# Patient Record
Sex: Female | Born: 1950
Health system: Southern US, Community
[De-identification: ages and names within clinical notes are randomized; demographics above are authoritative.]

## PROBLEM LIST (undated history)

## (undated) DIAGNOSIS — H3530 Unspecified macular degeneration: Secondary | ICD-10-CM

## (undated) DIAGNOSIS — G245 Blepharospasm: Secondary | ICD-10-CM

## (undated) DIAGNOSIS — M545 Low back pain: Secondary | ICD-10-CM

## (undated) DIAGNOSIS — R209 Unspecified disturbances of skin sensation: Secondary | ICD-10-CM

## (undated) DIAGNOSIS — K122 Cellulitis and abscess of mouth: Secondary | ICD-10-CM

## (undated) DIAGNOSIS — M542 Cervicalgia: Secondary | ICD-10-CM

## (undated) DIAGNOSIS — M1990 Unspecified osteoarthritis, unspecified site: Secondary | ICD-10-CM

## (undated) DIAGNOSIS — B009 Herpesviral infection, unspecified: Secondary | ICD-10-CM

## (undated) DIAGNOSIS — E785 Hyperlipidemia, unspecified: Secondary | ICD-10-CM

## (undated) DIAGNOSIS — I10 Essential (primary) hypertension: Secondary | ICD-10-CM

## (undated) DIAGNOSIS — K219 Gastro-esophageal reflux disease without esophagitis: Secondary | ICD-10-CM

## (undated) DIAGNOSIS — M898X9 Other specified disorders of bone, unspecified site: Secondary | ICD-10-CM

## (undated) DIAGNOSIS — Z9189 Other specified personal risk factors, not elsewhere classified: Secondary | ICD-10-CM

## (undated) DIAGNOSIS — J019 Acute sinusitis, unspecified: Secondary | ICD-10-CM

## (undated) DIAGNOSIS — F411 Generalized anxiety disorder: Secondary | ICD-10-CM

## (undated) DIAGNOSIS — T7840XA Allergy, unspecified, initial encounter: Secondary | ICD-10-CM

## (undated) DIAGNOSIS — H6690 Otitis media, unspecified, unspecified ear: Secondary | ICD-10-CM

## (undated) DIAGNOSIS — F329 Major depressive disorder, single episode, unspecified: Secondary | ICD-10-CM

## (undated) HISTORY — DX: Major depressive disorder, single episode, unspecified: F32.9

## (undated) HISTORY — DX: Other specified disorders of bone, unspecified site: M89.8X9

## (undated) HISTORY — PX: GANGLION CYST EXCISION: SHX1691

## (undated) HISTORY — DX: Blepharospasm: G24.5

## (undated) HISTORY — DX: Generalized anxiety disorder: F41.1

## (undated) HISTORY — PX: ABDOMINAL HYSTERECTOMY: SHX81

## (undated) HISTORY — DX: Unspecified osteoarthritis, unspecified site: M19.90

## (undated) HISTORY — DX: Unspecified disturbances of skin sensation: R20.9

## (undated) HISTORY — DX: Acute sinusitis, unspecified: J01.90

## (undated) HISTORY — PX: OTHER SURGICAL HISTORY: SHX169

## (undated) HISTORY — DX: Cellulitis and abscess of mouth: K12.2

## (undated) HISTORY — PX: CHOLECYSTECTOMY: SHX55

## (undated) HISTORY — DX: Other specified personal risk factors, not elsewhere classified: Z91.89

## (undated) HISTORY — DX: Unspecified macular degeneration: H35.30

## (undated) HISTORY — DX: Cervicalgia: M54.2

## (undated) HISTORY — DX: Allergy, unspecified, initial encounter: T78.40XA

## (undated) HISTORY — DX: Hyperlipidemia, unspecified: E78.5

## (undated) HISTORY — PX: BREAST LUMPECTOMY: SHX2

## (undated) HISTORY — DX: Otitis media, unspecified, unspecified ear: H66.90

## (undated) HISTORY — DX: Low back pain: M54.5

## (undated) HISTORY — DX: Gastro-esophageal reflux disease without esophagitis: K21.9

## (undated) HISTORY — DX: Herpesviral infection, unspecified: B00.9

## (undated) HISTORY — DX: Essential (primary) hypertension: I10

---

## 1999-01-11 ENCOUNTER — Ambulatory Visit (HOSPITAL_BASED_OUTPATIENT_CLINIC_OR_DEPARTMENT_OTHER): Admission: RE | Admit: 1999-01-11 | Discharge: 1999-01-11 | Payer: Self-pay | Admitting: Surgery

## 2000-02-26 ENCOUNTER — Other Ambulatory Visit: Admission: RE | Admit: 2000-02-26 | Discharge: 2000-02-26 | Payer: Self-pay | Admitting: Gastroenterology

## 2000-02-26 ENCOUNTER — Encounter (INDEPENDENT_AMBULATORY_CARE_PROVIDER_SITE_OTHER): Payer: Self-pay | Admitting: Specialist

## 2000-09-26 ENCOUNTER — Encounter: Payer: Self-pay | Admitting: Internal Medicine

## 2000-09-26 LAB — CONVERTED CEMR LAB

## 2001-08-01 ENCOUNTER — Encounter: Payer: Self-pay | Admitting: Orthopedic Surgery

## 2001-08-01 ENCOUNTER — Ambulatory Visit (HOSPITAL_COMMUNITY): Admission: RE | Admit: 2001-08-01 | Discharge: 2001-08-01 | Payer: Self-pay | Admitting: Orthopedic Surgery

## 2004-06-10 ENCOUNTER — Other Ambulatory Visit: Admission: RE | Admit: 2004-06-10 | Discharge: 2004-06-10 | Payer: Self-pay | Admitting: Obstetrics and Gynecology

## 2004-12-12 ENCOUNTER — Emergency Department (HOSPITAL_COMMUNITY): Admission: EM | Admit: 2004-12-12 | Discharge: 2004-12-12 | Payer: Self-pay | Admitting: Emergency Medicine

## 2004-12-23 ENCOUNTER — Ambulatory Visit: Payer: Self-pay | Admitting: Gastroenterology

## 2004-12-24 ENCOUNTER — Encounter (INDEPENDENT_AMBULATORY_CARE_PROVIDER_SITE_OTHER): Payer: Self-pay | Admitting: *Deleted

## 2004-12-24 ENCOUNTER — Ambulatory Visit (HOSPITAL_COMMUNITY): Admission: RE | Admit: 2004-12-24 | Discharge: 2004-12-25 | Payer: Self-pay

## 2005-01-01 ENCOUNTER — Inpatient Hospital Stay (HOSPITAL_COMMUNITY): Admission: EM | Admit: 2005-01-01 | Discharge: 2005-01-02 | Payer: Self-pay | Admitting: Emergency Medicine

## 2005-01-07 ENCOUNTER — Ambulatory Visit: Payer: Self-pay | Admitting: Internal Medicine

## 2005-01-29 ENCOUNTER — Ambulatory Visit: Payer: Self-pay | Admitting: Internal Medicine

## 2005-02-03 ENCOUNTER — Ambulatory Visit: Payer: Self-pay | Admitting: Internal Medicine

## 2005-06-06 ENCOUNTER — Ambulatory Visit: Payer: Self-pay | Admitting: Internal Medicine

## 2006-01-16 ENCOUNTER — Ambulatory Visit: Payer: Self-pay | Admitting: Internal Medicine

## 2006-12-01 ENCOUNTER — Ambulatory Visit: Payer: Self-pay | Admitting: Internal Medicine

## 2006-12-01 DIAGNOSIS — H3530 Unspecified macular degeneration: Secondary | ICD-10-CM

## 2006-12-01 DIAGNOSIS — F329 Major depressive disorder, single episode, unspecified: Secondary | ICD-10-CM

## 2006-12-01 DIAGNOSIS — M898X9 Other specified disorders of bone, unspecified site: Secondary | ICD-10-CM

## 2006-12-01 DIAGNOSIS — M545 Low back pain, unspecified: Secondary | ICD-10-CM

## 2006-12-01 DIAGNOSIS — F411 Generalized anxiety disorder: Secondary | ICD-10-CM

## 2006-12-01 DIAGNOSIS — K219 Gastro-esophageal reflux disease without esophagitis: Secondary | ICD-10-CM | POA: Insufficient documentation

## 2006-12-01 DIAGNOSIS — G245 Blepharospasm: Secondary | ICD-10-CM

## 2006-12-01 DIAGNOSIS — Z9189 Other specified personal risk factors, not elsewhere classified: Secondary | ICD-10-CM

## 2006-12-01 DIAGNOSIS — I10 Essential (primary) hypertension: Secondary | ICD-10-CM

## 2006-12-01 DIAGNOSIS — F32A Depression, unspecified: Secondary | ICD-10-CM | POA: Insufficient documentation

## 2006-12-01 DIAGNOSIS — E785 Hyperlipidemia, unspecified: Secondary | ICD-10-CM | POA: Insufficient documentation

## 2006-12-01 DIAGNOSIS — F3289 Other specified depressive episodes: Secondary | ICD-10-CM

## 2006-12-01 HISTORY — DX: Unspecified macular degeneration: H35.30

## 2006-12-01 HISTORY — DX: Other specified disorders of bone, unspecified site: M89.8X9

## 2006-12-01 HISTORY — DX: Gastro-esophageal reflux disease without esophagitis: K21.9

## 2006-12-01 HISTORY — DX: Major depressive disorder, single episode, unspecified: F32.9

## 2006-12-01 HISTORY — DX: Generalized anxiety disorder: F41.1

## 2006-12-01 HISTORY — DX: Blepharospasm: G24.5

## 2006-12-01 HISTORY — DX: Hyperlipidemia, unspecified: E78.5

## 2006-12-01 HISTORY — DX: Other specified personal risk factors, not elsewhere classified: Z91.89

## 2006-12-01 HISTORY — DX: Low back pain, unspecified: M54.50

## 2006-12-01 HISTORY — DX: Other specified depressive episodes: F32.89

## 2006-12-01 HISTORY — DX: Essential (primary) hypertension: I10

## 2007-12-24 ENCOUNTER — Telehealth: Payer: Self-pay | Admitting: Internal Medicine

## 2008-05-01 ENCOUNTER — Ambulatory Visit: Payer: Self-pay | Admitting: Internal Medicine

## 2008-05-01 DIAGNOSIS — K122 Cellulitis and abscess of mouth: Secondary | ICD-10-CM

## 2008-05-01 HISTORY — DX: Cellulitis and abscess of mouth: K12.2

## 2008-05-22 ENCOUNTER — Telehealth (INDEPENDENT_AMBULATORY_CARE_PROVIDER_SITE_OTHER): Payer: Self-pay | Admitting: *Deleted

## 2008-05-30 ENCOUNTER — Ambulatory Visit: Payer: Self-pay | Admitting: Internal Medicine

## 2008-05-30 DIAGNOSIS — H6690 Otitis media, unspecified, unspecified ear: Secondary | ICD-10-CM | POA: Insufficient documentation

## 2008-05-30 DIAGNOSIS — B009 Herpesviral infection, unspecified: Secondary | ICD-10-CM | POA: Insufficient documentation

## 2008-05-30 HISTORY — DX: Herpesviral infection, unspecified: B00.9

## 2008-05-30 HISTORY — DX: Otitis media, unspecified, unspecified ear: H66.90

## 2008-08-01 ENCOUNTER — Telehealth (INDEPENDENT_AMBULATORY_CARE_PROVIDER_SITE_OTHER): Payer: Self-pay | Admitting: *Deleted

## 2008-08-24 ENCOUNTER — Ambulatory Visit: Payer: Self-pay | Admitting: Internal Medicine

## 2008-08-24 DIAGNOSIS — M542 Cervicalgia: Secondary | ICD-10-CM

## 2008-08-24 DIAGNOSIS — R209 Unspecified disturbances of skin sensation: Secondary | ICD-10-CM

## 2008-08-24 HISTORY — DX: Unspecified disturbances of skin sensation: R20.9

## 2008-08-24 HISTORY — DX: Cervicalgia: M54.2

## 2008-08-30 ENCOUNTER — Telehealth: Payer: Self-pay | Admitting: Internal Medicine

## 2008-12-25 ENCOUNTER — Encounter: Payer: Self-pay | Admitting: Internal Medicine

## 2009-01-11 ENCOUNTER — Telehealth: Payer: Self-pay | Admitting: Internal Medicine

## 2009-06-13 ENCOUNTER — Telehealth: Payer: Self-pay | Admitting: Internal Medicine

## 2009-08-23 ENCOUNTER — Ambulatory Visit: Payer: Self-pay | Admitting: Internal Medicine

## 2009-08-23 LAB — CONVERTED CEMR LAB
Basophils Relative: 0.4 % (ref 0.0–3.0)
CO2: 29 meq/L (ref 19–32)
Cholesterol: 341 mg/dL — ABNORMAL HIGH (ref 0–200)
Creatinine, Ser: 1 mg/dL (ref 0.4–1.2)
Eosinophils Absolute: 0.1 10*3/uL (ref 0.0–0.7)
Eosinophils Relative: 0.9 % (ref 0.0–5.0)
GFR calc non Af Amer: 60.75 mL/min (ref >60)
HCT: 35.3 % — ABNORMAL LOW (ref 36.0–46.0)
HDL: 45.6 mg/dL (ref >39.00)
Ketones, ur: NEGATIVE mg/dL
Leukocytes, UA: NEGATIVE
Lymphocytes Relative: 18.5 % (ref 12.0–46.0)
Lymphs Abs: 2.3 10*3/uL (ref 0.7–4.0)
MCHC: 34.1 g/dL (ref 30.0–36.0)
Monocytes Relative: 4.5 % (ref 3.0–12.0)
Neutro Abs: 9.4 10*3/uL — ABNORMAL HIGH (ref 1.4–7.7)
Neutrophils Relative %: 75.7 % (ref 43.0–77.0)
Platelets: 249 10*3/uL (ref 150.0–400.0)
Potassium: 4.8 meq/L (ref 3.5–5.1)
RBC: 3.89 M/uL (ref 3.87–5.11)
Specific Gravity, Urine: 1.01 (ref 1.000–1.030)
TSH: 3.81 microintl units/mL (ref 0.35–5.50)
Total Bilirubin: 0.5 mg/dL (ref 0.3–1.2)
Total CHOL/HDL Ratio: 7
Total Protein: 6.5 g/dL (ref 6.0–8.3)
Triglycerides: 321 mg/dL — ABNORMAL HIGH (ref 0.0–149.0)
Urine Glucose: NEGATIVE mg/dL
Urobilinogen, UA: 0.2 (ref 0.0–1.0)
WBC: 12.4 10*3/uL — ABNORMAL HIGH (ref 4.5–10.5)
pH: 5.5 (ref 5.0–8.0)

## 2009-08-27 ENCOUNTER — Telehealth: Payer: Self-pay | Admitting: Internal Medicine

## 2009-11-12 ENCOUNTER — Encounter: Payer: Self-pay | Admitting: Internal Medicine

## 2010-01-07 ENCOUNTER — Ambulatory Visit: Payer: Self-pay | Admitting: Internal Medicine

## 2010-01-07 DIAGNOSIS — J019 Acute sinusitis, unspecified: Secondary | ICD-10-CM

## 2010-01-07 HISTORY — DX: Acute sinusitis, unspecified: J01.90

## 2010-05-30 NOTE — Letter (Signed)
Summary: Referral - not able to see patient  [facility]  [address] [place], [place] [address]   Phone: [phone]  Fax: [phone]    November 12, 2009    Corwin Levins, M.D. [address] [place], [place] [address]   Re:   [name] DOB:  1951/01/26 MRN:   [id number]    Dear Dr. Jonny Ruiz:  Thank you for your kind referral of the above patient.  We have attempted to schedule the recommended procedure Screening Colonoscopy but have not been able to schedule because:   X  The patient was not available by phone and/or has not returned our calls.  ___ The patient declined to schedule the procedure at this time.  We appreciate the referral and hope that we will have the opportunity to treat this patient in the future.    Sincerely,    [facility] [phone]

## 2010-05-30 NOTE — Assessment & Plan Note (Signed)
Summary: ?EQUALIBRIUM OFF/CD   Vital Signs:  Patient profile:   60 year old female Height:      62.5 inches Weight:      141.13 pounds BMI:     25.49 O2 Sat:      97 % on Room air Temp:     98.4 degrees F oral Pulse rate:   62 / minute BP sitting:   120 / 62  (left arm) Cuff size:   regular  Vitals Entered By: Zella Ball Ewing CMA Duncan Dull) (January 07, 2010 3:35 PM)  O2 Flow:  Room air CC: Sinus congestion, head, ear and throat pain/RE   CC:  Sinus congestion, head, and ear and throat pain/RE.  History of Present Illness: here with 3 days worsening acute mild to mod facial pain, pressure, fever and greenish d/c, mild ST, no cough and Pt denies CP, worsening sob, doe, wheezing, orthopnea, pnd, worsening LE edema, palps, dizziness or syncope  Pt denies new neuro symptoms such as headache, facial or extremity weakness , except for left earache and pain to head above the ear as well.  No chills, polyuria, polydipsia. Overall good complaicne with meds, good tolerabiliyt  Problems Prior to Update: 1)  Sinusitis- Acute-nos  (ICD-461.9) 2)  Preventive Health Care  (ICD-V70.0) 3)  Paresthesia  (ICD-782.0) 4)  Cervicalgia  (ICD-723.1) 5)  Cold Sore  (ICD-054.9) 6)  Otitis Media, Acute, Left  (ICD-382.9) 7)  Cellulitis and Abscess of Oral Soft Tissues  (ICD-528.3) 8)  Hypertension, Borderline  (ICD-401.9) 9)  Macular Degeneration  (ICD-362.50) 10)  Blepharospasm  (ICD-333.81) 11)  Bone Spur  (ICD-726.91) 12)  Excision of Ganglion Cyst, Wrist, Hx of  (ICD-V15.89) 13)  Hyperlipidemia  (ICD-272.4) 14)  Anxiety  (ICD-300.00) 15)  Low Back Pain  (ICD-724.2) 16)  Gerd  (ICD-530.81) 17)  Depression  (ICD-311)  Medications Prior to Update: 1)  Alprazolam 1 Mg  Tabs (Alprazolam) .Marland Kitchen.. 1 By Mouth Two Times A Day As Needed 2)  Fluoxetine Hcl 40 Mg Caps (Fluoxetine Hcl) .... Take 1 By Mouth Once Daily 3)  Simvastatin 40 Mg Tabs (Simvastatin) .Marland Kitchen.. 1po Once Daily 4)  Valacyclovir Hcl 1 Gm Tabs  (Valacyclovir Hcl) .Marland Kitchen.. 1 By Mouth Two Times A Day For 5 Days 5)  Adult Aspirin Ec Low Strength 81 Mg Tbec (Aspirin) .Marland Kitchen.. 1 By Mouth Once Daily 6)  Tramadol Hcl 50 Mg Tabs (Tramadol Hcl) .Marland Kitchen.. 1po Q 6 Hrs As Needed Pain 7)  Omeprazole 20 Mg Cpdr (Omeprazole) .... 2 By Mouth Once Daily 8)  Cephalexin 500 Mg Caps (Cephalexin) .Marland Kitchen.. 1 Po  Three Times A Day  Current Medications (verified): 1)  Alprazolam 1 Mg  Tabs (Alprazolam) .Marland Kitchen.. 1 By Mouth Two Times A Day As Needed 2)  Fluoxetine Hcl 40 Mg Caps (Fluoxetine Hcl) .... Take 1 By Mouth Once Daily 3)  Simvastatin 40 Mg Tabs (Simvastatin) .Marland Kitchen.. 1po Once Daily 4)  Valacyclovir Hcl 1 Gm Tabs (Valacyclovir Hcl) .Marland Kitchen.. 1 By Mouth Two Times A Day For 5 Days 5)  Adult Aspirin Ec Low Strength 81 Mg Tbec (Aspirin) .Marland Kitchen.. 1 By Mouth Once Daily 6)  Tramadol Hcl 50 Mg Tabs (Tramadol Hcl) .Marland Kitchen.. 1po Q 6 Hrs As Needed Pain 7)  Omeprazole 20 Mg Cpdr (Omeprazole) .... 2 By Mouth Once Daily 8)  Doxycycline Hyclate 100 Mg Caps (Doxycycline Hyclate) .Marland Kitchen.. 1po Two Times A Day  Allergies (verified): 1)  ! Codeine 2)  ! Vicodin 3)  ! * Claritin D  Past History:  Past Medical History: Last updated: 12/01/2006 Depression GERD Low back pain Anxiety Hyperlipidemia  Past Surgical History: Last updated: 12/01/2006 Hysterectomy Lumpectomy- R Breast Removal of Ganglion Cyst Removal of bone spur, R foot Blepharospasm H/O Macular Degeneration Borderline HTN  Social History: Last updated: 08/23/2009 Married Current Smoker 1 child  work  - Forensic psychologist - collections Alcohol use-no Drug use-no  Risk Factors: Smoking Status: current (05/01/2008) Packs/Day: 1 PPD (12/01/2006)  Review of Systems       all otherwise negative per pt -    Physical Exam  General:  alert and well-developed.  , mild ill  Head:  normocephalic and atraumatic.   Eyes:  vision grossly intact, pupils equal, and pupils round.   Ears:  bilat tm's erythema, left more than right;   canals clear, sinus tender bialt maxillary Nose:  nasal dischargemucosal pallor and mucosal edema.   Mouth:  pharyngeal erythema and fair dentition.   Neck:  supple and cervical lymphadenopathy.   Lungs:  normal respiratory effort and normal breath sounds.   Heart:  normal rate and regular rhythm.   Extremities:  no edema, no erythema    Impression & Recommendations:  Problem # 1:  SINUSITIS- ACUTE-NOS (ICD-461.9)  Her updated medication list for this problem includes:    Doxycycline Hyclate 100 Mg Caps (Doxycycline hyclate) .Marland Kitchen... 1po two times a day treat as above, f/u any worsening signs or symptoms   Problem # 2:  HYPERLIPIDEMIA (ICD-272.4)  Her updated medication list for this problem includes:    Simvastatin 40 Mg Tabs (Simvastatin) .Marland Kitchen... 1po once daily decleins labs today,  Pt to continue diet efforts, good med tolerance;  - goal LDL less than 100  Problem # 3:  HYPERTENSION, BORDERLINE (ICD-401.9)  BP today: 120/62 Prior BP: 142/62 (08/23/2009)  Labs Reviewed: K+: 4.8 (08/23/2009) Creat: : 1.0 (08/23/2009)   Chol: 341 (08/23/2009)   HDL: 45.60 (08/23/2009)   TG: 321.0 (08/23/2009) improved, stable overall by hx and exam, ok to continue meds/tx as is   Complete Medication List: 1)  Alprazolam 1 Mg Tabs (Alprazolam) .Marland Kitchen.. 1 by mouth two times a day as needed 2)  Fluoxetine Hcl 40 Mg Caps (Fluoxetine hcl) .... Take 1 by mouth once daily 3)  Simvastatin 40 Mg Tabs (Simvastatin) .Marland Kitchen.. 1po once daily 4)  Valacyclovir Hcl 1 Gm Tabs (Valacyclovir hcl) .Marland Kitchen.. 1 by mouth two times a day for 5 days 5)  Adult Aspirin Ec Low Strength 81 Mg Tbec (Aspirin) .Marland Kitchen.. 1 by mouth once daily 6)  Tramadol Hcl 50 Mg Tabs (Tramadol hcl) .Marland Kitchen.. 1po q 6 hrs as needed pain 7)  Omeprazole 20 Mg Cpdr (Omeprazole) .... 2 by mouth once daily 8)  Doxycycline Hyclate 100 Mg Caps (Doxycycline hyclate) .Marland Kitchen.. 1po two times a day  Patient Instructions: 1)  Please take all new medications as prescribed 2)   Continue all previous medications as before this visit  3)  You can also use Mucinex OTC or it's generic for congestion  4)  Please schedule a follow-up appointment in April 2012 with cpx labs Prescriptions: DOXYCYCLINE HYCLATE 100 MG CAPS (DOXYCYCLINE HYCLATE) 1po two times a day  #20 x 0   Entered and Authorized by:   Corwin Levins MD   Signed by:   Corwin Levins MD on 01/07/2010   Method used:   Electronically to        [facility] (retail)       [address]  [place], [place]  [address]       Ph: [phone]       Fax: [phone]   RxID:   [id number]

## 2010-05-30 NOTE — Progress Notes (Signed)
Summary: URI?  Phone Note Call from Patient Call back at Work Phone [phone]   Caller: Patient Summary of Call: pt called stating that at OV 04/28 ahe was told by MD that she has a URI that was also in her lymph nodes and that was the cause of e fever blisters. Pt says that she did not get an Rx for an ABX and is requesting one now. please advise Initial call taken by: Margaret Pyle, CMA,  Aug 27, 2009 11:41 AM  Follow-up for Phone Call        ok to try cephalexin - done escript Follow-up by: Corwin Levins MD,  Aug 27, 2009 1:12 PM  Additional Follow-up for Phone Call Additional follow up Details #1::        pt informed Additional Follow-up by: Margaret Pyle, CMA,  Aug 27, 2009 1:23 PM    New/Updated Medications: CEPHALEXIN 500 MG CAPS (CEPHALEXIN) 1 po  three times a day Prescriptions: CEPHALEXIN 500 MG CAPS (CEPHALEXIN) 1 po  three times a day  #30 x 0   Entered and Authorized by:   Corwin Levins MD   Signed by:   Corwin Levins MD on 08/27/2009   Method used:   Electronically to        [facility] (retail)       [address]       [place], [place]  [address]       Ph: [phone]       Fax: [phone]   RxID:   [id number]

## 2010-05-30 NOTE — Assessment & Plan Note (Signed)
Summary: FU/MEDS---AND --FEVER BLISTERS  STC   Vital Signs:  Patient profile:   60 year old female Height:      62 inches Weight:      140.50 pounds BMI:     25.79 O2 Sat:      97 % on Room air Temp:     98 degrees F oral Pulse rate:   72 / minute BP sitting:   142 / 62  (left arm) Cuff size:   regular  Vitals Entered ByZella Ball Ewing (August 23, 2009 8:09 AM)  O2 Flow:  Room air  CC: Medication followup, fever blisters/RE   CC:  Medication followup and fever blisters/RE.  History of Present Illness: here for yearly f/u;  more stress lately, slept poorly last evening and today with fever blisters to the lips lower ;  in conjunction with recent URI and tender LA to left submandibular area last wk;  tuesday donated blood with BP there 100/70;  requests valtrex tx and pain med;  Pt denies CP, sob, doe, wheezing, orthopnea, pnd, worsening LE edema, palps, dizziness or syncope  Pt denies new neuro symptoms such as headache, facial or extremity weakness   Needs all other meds refilled as well.   Crestor too expensive.  Lower lip with fever blister quite painful  Preventive Screening-Counseling & Management      Drug Use:  no.    Problems Prior to Update: 1)  Preventive Health Care  (ICD-V70.0) 2)  Paresthesia  (ICD-782.0) 3)  Cervicalgia  (ICD-723.1) 4)  Cold Sore  (ICD-054.9) 5)  Otitis Media, Acute, Left  (ICD-382.9) 6)  Cellulitis and Abscess of Oral Soft Tissues  (ICD-528.3) 7)  Hypertension, Borderline  (ICD-401.9) 8)  Macular Degeneration  (ICD-362.50) 9)  Blepharospasm  (ICD-333.81) 10)  Bone Spur  (ICD-726.91) 11)  Excision of Ganglion Cyst, Wrist, Hx of  (ICD-V15.89) 12)  Hyperlipidemia  (ICD-272.4) 13)  Anxiety  (ICD-300.00) 14)  Low Back Pain  (ICD-724.2) 15)  Gerd  (ICD-530.81) 16)  Depression  (ICD-311)  Medications Prior to Update: 1)  Alprazolam 1 Mg  Tabs (Alprazolam) .Marland Kitchen.. 1 By Mouth Two Times A Day As Needed 2)  Fluoxetine Hcl 40 Mg Caps (Fluoxetine Hcl)  .... Take 1 By Mouth Once Daily 3)  Crestor 40 Mg Tabs (Rosuvastatin Calcium) .... 1/2  By Mouth Once Daily 4)  Valacyclovir Hcl 1 Gm Tabs (Valacyclovir Hcl) .Marland Kitchen.. 1 By Mouth Two Times A Day For 5 Days 5)  Adult Aspirin Ec Low Strength 81 Mg Tbec (Aspirin) .Marland Kitchen.. 1 By Mouth Once Daily 6)  Prednisone 10 Mg Tabs (Prednisone) .... 4po Qd For 3days, Then 3po Qd For 3days, Then 2po Qd For 3days, Then 1po Qd For 3 Days, Then Stop  Current Medications (verified): 1)  Alprazolam 1 Mg  Tabs (Alprazolam) .Marland Kitchen.. 1 By Mouth Two Times A Day As Needed 2)  Fluoxetine Hcl 40 Mg Caps (Fluoxetine Hcl) .... Take 1 By Mouth Once Daily 3)  Simvastatin 40 Mg Tabs (Simvastatin) .Marland Kitchen.. 1po Once Daily 4)  Valacyclovir Hcl 1 Gm Tabs (Valacyclovir Hcl) .Marland Kitchen.. 1 By Mouth Two Times A Day For 5 Days 5)  Adult Aspirin Ec Low Strength 81 Mg Tbec (Aspirin) .Marland Kitchen.. 1 By Mouth Once Daily 6)  Tramadol Hcl 50 Mg Tabs (Tramadol Hcl) .Marland Kitchen.. 1po Q 6 Hrs As Needed Pain 7)  Omeprazole 20 Mg Cpdr (Omeprazole) .... 2 By Mouth Once Daily  Allergies (verified): 1)  ! Codeine 2)  ! Vicodin 3)  ! 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 father with heart disease mother with HTN daughter with TYpe I DM   Social History: Reviewed history from 05/01/2008  and no changes required. 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         all otherwise negative per pt -  except for reflux symtpoms worsening in the past few months without dysphagia or n/v  Physical Exam  General:  alert and well-developed.   Head:  normocephalic and atraumatic.   Eyes:  vision grossly intact, pupils equal, and pupils round.   Ears:  R ear normal and L ear normal.   Nose:  no external deformity and no nasal discharge.   Mouth:  no gingival abnormalities and pharynx pink and moist.   Neck:  supple and no masses.   Lungs:  normal respiratory effort and normal breath sounds.   Heart:  normal rate and regular rhythm.   Abdomen:  soft and normal bowel sounds.   but mild epigastric tender without guarding or rebound Msk:  no joint tenderness and no joint swelling.   Extremities:  no edema, no erythema  Neurologic:  cranial nerves II-XII intact and strength normal in all extremities.   Skin:  color normal and no rashes.  , has several fever blister to lower lip area Psych:  not depressed appearing and moderately anxious.     Impression & Recommendations:  Problem # 1:  Preventive Health Care (ICD-V70.0)  Overall doing well, age appropriate education and counseling updated and referral for appropriate preventive services done unless declined, immunizations up to date or declined, diet counseling done if overweight, urged to quit smoking if smokes , most recent  labs reviewed and current ordered if appropriate, ecg reviewed or declined (interpretation per ECG scanned in the EMR if done); information regarding Medicare Prevention requirements given if appropriate   Orders: Gastroenterology Referral (GI) TLB-BMP (Basic Metabolic Panel-BMET) (80048-METABOL) TLB-CBC Platelet - w/Differential (85025-CBCD) TLB-Hepatic/Liver Function Pnl (80076-HEPATIC) TLB-Lipid Panel (80061-LIPID) TLB-TSH (Thyroid Stimulating Hormone) (84443-TSH) TLB-Udip ONLY (81003-UDIP)  Problem # 2:  COLD SORE (ICD-054.9)  for valtrex and tylenol #3 as needed   Problem # 3:  HYPERTENSION, BORDERLINE (ICD-401.9)  stable overall by hx and exam, ok to continue meds/tx as is , declines further meds  BP today: 142/62 Prior BP: 110/70 (08/24/2008)  Problem # 4:  ANXIETY (ICD-300.00)  Her updated medication list for this problem includes:    Alprazolam 1 Mg Tabs (Alprazolam) .Marland Kitchen... 1 by mouth two times a day as  needed    Fluoxetine Hcl 40 Mg Caps (Fluoxetine hcl) .Marland Kitchen... Take 1 by mouth once daily stable overall by hx and exam, ok to continue meds/tx as is   Problem # 5:  HYPERLIPIDEMIA (ICD-272.4)  Her updated medication list for this problem includes:    Simvastatin 40 Mg Tabs (Simvastatin) .Marland Kitchen... 1po once daily  crestor too expensive - change to simvasatatin , cont diet  Problem # 6:  GERD (ICD-530.81)  Her updated medication list for this problem includes:    Omeprazole 20 Mg Cpdr (Omeprazole) .Marland Kitchen... 2 by mouth once daily treat as above, f/u any worsening signs or symptoms   Complete Medication List: 1)  Alprazolam 1 Mg Tabs (Alprazolam) .Marland Kitchen.. 1 by mouth two times a day as needed 2)  Fluoxetine Hcl 40 Mg Caps (Fluoxetine hcl) .... Take 1 by mouth once daily 3)  Simvastatin 40 Mg Tabs (Simvastatin) .Marland Kitchen.. 1po once daily 4)  Valacyclovir Hcl 1 Gm Tabs (Valacyclovir hcl) .Marland Kitchen.. 1 by mouth two times a day for 5 days 5)  Adult Aspirin Ec Low Strength 81 Mg Tbec (Aspirin) .Marland Kitchen.. 1 by  mouth once daily 6)  Tramadol Hcl 50 Mg Tabs (Tramadol hcl) .Marland Kitchen.. 1po q 6 hrs as needed pain 7)  Omeprazole 20 Mg Cpdr (Omeprazole) .... 2 by mouth once daily  Other Orders: Tdap => 15yrs IM ([address]) Admin 1st Vaccine ([address])  Patient Instructions: 1)  stop the crestor as you have 2)  start the simvastatin for cholesterol 3)  Please take all new medications as prescribed  4)  Continue all previous medications as before this visit  5)  all of your medications were sent to the pharmacy except for ones given in hardcopy 6)  Please go to the Lab in the basement for your blood and/or urine tests today 7)  you had the tetanus shot today 8)  You will be contacted about the referral(s) to: colonoscopy 9)  Please schedule a follow-up appointment in 1 year or sooner if needed Prescriptions: OMEPRAZOLE 20 MG CPDR (OMEPRAZOLE) 2 by mouth once daily  #60 x 1   Entered and Authorized by:   Corwin Levins MD   Signed by:   Corwin Levins MD on 08/23/2009   Method used:   Print then Give to Patient   RxID:   [id number] TRAMADOL HCL 50 MG TABS (TRAMADOL HCL) 1po q 6 hrs as needed pain  #60 x 0   Entered and Authorized by:   Corwin Levins MD   Signed by:   Corwin Levins MD on 08/23/2009   Method used:   Print then Give to Patient   RxID:   [id number] TYLENOL WITH CODEINE #3 300-30 MG TABS (ACETAMINOPHEN-CODEINE) 1po q 6 hrs as needed  #50 x 0   Entered and Authorized by:   Corwin Levins MD   Signed by:   Corwin Levins MD on 08/23/2009   Method used:   Print then Give to Patient   RxID:   [id number] TYLENOL WITH CODEINE #3 300-30 MG TABS (ACETAMINOPHEN-CODEINE) 1po q 6 hrs as needed  #0 x 0   Entered and Authorized by:   Corwin Levins MD   Signed by:   Corwin Levins MD on 08/23/2009   Method used:   Print then Give to Patient   RxID:   [id number] VALACYCLOVIR HCL 1 GM TABS (VALACYCLOVIR HCL) 1 by mouth two times a day for 5 days  #10 x 2  Entered and Authorized by:   Corwin Levins  MD   Signed by:   Corwin Levins MD on 08/23/2009   Method used:   Electronically to        [facility] (retail)       [address]       [place], [place]  [address]       Ph: [phone]       Fax: [phone]   RxID:   [id number] FLUOXETINE HCL 40 MG CAPS (FLUOXETINE HCL) take 1 by mouth once daily  #90 x 3   Entered and Authorized by:   Corwin Levins MD   Signed by:   Corwin Levins MD on 08/23/2009   Method used:   Electronically to        [facility] (retail)       [address]       [place], [place]  [address]       Ph: [phone]       Fax: [phone]   RxID:   [id number] ALPRAZOLAM 1 MG  TABS (ALPRAZOLAM) 1 by mouth two times a day as needed  #60 x 2   Entered and Authorized by:   Corwin Levins MD   Signed by:   Corwin Levins MD on 08/23/2009   Method used:   Print then Give to Patient   RxID:   [id number] SIMVASTATIN 40 MG TABS (SIMVASTATIN) 1po once daily  #90 x 3   Entered and Authorized by:   Corwin Levins MD   Signed by:   Corwin Levins MD on 08/23/2009   Method used:   Electronically to        [facility] (retail)       [address]       [place], [place]  [address]       Ph: [phone]       Fax: [phone]   RxID:   [id number]    Immunizations Administered:  Tetanus Vaccine:    Vaccine Type: Tdap    Site: left deltoid    Mfr: GlaxoSmithKline    Dose: 0.5 ml    Route: IM    Given by: Robin Ewing    Exp. Date: 07/21/2011    Lot #: [id number]    VIS given: 03/16/07 version given August 23, 2009.

## 2010-05-30 NOTE — Progress Notes (Signed)
Summary: Rx request  Phone Note Call from Patient   Caller: Patient [phone] Summary of Call: pt is requesting rx for generic Valtrex for fever blister. Initial call taken by: Margaret Pyle, CMA,  June 13, 2009 9:07 AM  Follow-up for Phone Call        done hardcopy to [facility] side B - dahlia  Follow-up by: Corwin Levins MD,  June 13, 2009 10:02 AM  Additional Follow-up for Phone Call Additional follow up Details #1::        pt informed Additional Follow-up by: Margaret Pyle, CMA,  June 13, 2009 10:11 AM    New/Updated Medications: VALACYCLOVIR HCL 1 GM TABS (VALACYCLOVIR HCL) 1 by mouth two times a day for 5 days Prescriptions: VALACYCLOVIR HCL 1 GM TABS (VALACYCLOVIR HCL) 1 by mouth two times a day for 5 days  #10 x 2   Entered and Authorized by:   Corwin Levins MD   Signed by:   Corwin Levins MD on 06/13/2009   Method used:   Electronically to        [facility] (retail)       [address]       [place], [place]  [address]       Ph: [phone]       Fax: [phone]   RxID:   [id number]

## 2010-08-28 ENCOUNTER — Other Ambulatory Visit: Payer: Self-pay | Admitting: Internal Medicine

## 2010-09-13 NOTE — Discharge Summary (Signed)
NAMESALVATRICE, MORANDI                 ACCOUNT NO.:  [id number]   MEDICAL RECORD NO.:  [id number]          PATIENT TYPE:  INP   LOCATION:  1408                         FACILITY:  [facility]   PHYSICIAN:  Iva Boop, M.D. LHCDATE OF BIRTH:  10-Aug-1950   DATE OF ADMISSION:  01/01/2005  DATE OF DISCHARGE:  01/02/2005                                 DISCHARGE SUMMARY   ADMITTING DIAGNOSES:  38.  A 60 year old female with acute right abdominal and right flank pain      associated with nausea and vomiting, elevated liver function tests and      leukocytosis.  The patient is one week status post laparoscopic      cholecystectomy and subsequent negative endoscopic retrograde      cholangiopancreatography.  Suspect retained common bile duct stone.      Rule out bile leak though no evidence on ultrasound.  2.  Status post laparoscopic cholecystectomy December 24, 2004.  3.  Status post hysterectomy.  4.  Status post right rotator cuff repair and prior right lumpectomy for      benign disease.   DISCHARGE DIAGNOSES:  1.  Choledocholithiasis, stable status post endoscopic retrograde      cholangiopancreatography and stone extraction.  2.  Other diagnoses as listed above.   CONSULTATIONS:  None.   PROCEDURES:  Endoscopic retrograde cholangiopancreatography, sphincterotomy  and stone extraction per Dr. Stan Head, January 01, 2005.   BRIEF HISTORY:  Burkley is a pleasant generally healthy 60 year old white  female who was hospitalized approximately 1-1/2 weeks ago prior to this  admission at which time she had undergone a laparoscopic cholecystectomy  with Dr. Orson Slick.  Intraoperative cholangiogram showed a 2-3 mm distal common  bile duct filling defect.  The common bile duct was dilated to 11 mm.  Ultrasound done on December 12, 2004, showed a common bile duct of 10 mm and  cholelithiasis but no definite choledocholithiasis.  The ERCP was done  postoperatively per Dr. Arlyce Dice on December 25, 2004,  and balloon sweeps were  done of the common bile duct.  There were no other were no stones or debris  noted, and the exam was felt to be normal.  He tolerated the procedure well  and was discharged home the following day but says that she is continuing to  have some mild right flank discomfort and nausea since discharge and then  the evening prior to admission about 9:00 p.m. developed severe right-sided  flank and abdominal discomfort with nausea, vomiting and presented back to  the emergency room.   LABORATORY DATA:  Laboratory studies showed a WBC of 17.7, alkaline  phosphatase, total bilirubin 1.4, SGOT 333 and SGPT of 333.  She had a  repeat abdominal ultrasound showing dilated common bile duct of 13 mm, no  definite filling defect noted.  She is admitted to the hospital with  probable choledocholithiasis for pain control,  IV antibiotics and ERCP  repeat ERCP with stone extraction in indicated.   Laboratory studies on admission 09/06 WBC of 17.7, hemoglobin 14.7,  hematocrit of 44.0, MCV of 87, platelets  339. Pro time 12.5, INR 0.9,  potassium 3.2, creatinine 0.9, BUN 8 and on admission albumin 3.9.  Liver  function studies on admission total bilirubin 1.4, alkaline phosphatase 416,  ALT 333, AST 333, amylase 108, lipase of 65. Followup on January 02, 2005,  amylase was 503 and lipase was 393.    Her x-ray studies on January 01, 2005, the patient is status post  cholecystectomy.  She has biliary ductal dilation to 13 mm and intrahepatic  ductal dilation as well.  No stones noted.   HOSPITAL COURSE:  The patient was admitted to the service of Dr. Claudette Head, placed on IV fluids and Dilaudid and Phenergan as needed for pain and  nausea.  She was started on IV Unasyn. She underwent an abdominal ultrasound  with findings as outlined above.  Later that day, she underwent ERCP with  Dr. Leone Payor with sphincterotomy and extraction of a  5 mm common bile duct  stone.  She had good  drainage post procedure.  She tolerated the procedure  without difficulty.  Her diet was gradually advanced, and the following day  she was feeling significantly better, remained afebrile and had no  complaints of abdominal pain.  She did have an elevated amylase and lipase  but clinically did not have any evidence of pancreatitis.  We advanced her  diet.  She tolerated this without difficulty and was discharged home later  that same day with instructions to follow up with Dr. Leone Payor as needed to  come to our office in one week for followup liver function study tests.  She  to follow up with Dr. Orson Slick as previously scheduled for a surgical  followup.   DISCHARGE MEDICATIONS:  1.  Xanax q.h.s. as previous.  2.  Percocet one p.o. q. 4-6 hours p.r.n. pain.   CONDITION:  Stable.      Mike Gip, P.A.-C. [facility]      Iva Boop, M.D. [facility]  Electronically Signed    AE/MEDQ  D:  01/09/2005  T:  01/10/2005  Job:  [id number]

## 2010-09-13 NOTE — Op Note (Signed)
[name], [name]                 ACCOUNT NO.:  [id number]   MEDICAL RECORD NO.:  [id number]          PATIENT TYPE:  AMB   LOCATION:  DAY                          FACILITY:  [facility]   PHYSICIAN:  Lorre Munroe., M.D.DATE OF BIRTH:  1951/01/03   DATE OF PROCEDURE:  12/24/2004  DATE OF DISCHARGE:                                 OPERATIVE REPORT   PREOPERATIVE DIAGNOSIS:  Gallstones   POSTOPERATIVE DIAGNOSIS:  Gallstones and probable common bile duct stone.   OPERATION:  Laparoscopic cholecystectomy with operative cholangiogram.   SURGEON:  Dr. Orson Slick.   ANESTHESIA:  General and local.   PROCEDURE:  After the patient was monitored and anesthetized and had routine  preparation and draping of the abdomen, I infiltrated local anesthetic just  below the umbilicus and made a small transverse incision there, dissected  down to the fascia and opened it longitudinally for about 2 cm.  After  bluntly opening the peritoneum, I placed a 0 Vicryl pursestring suture and  secured a Hassan cannula and inflated the abdomen with carbon dioxide.  I  then put in a laparoscope and saw no abnormalities except for some adhesions  of the omentum to the undersurface of the gallbladder.  After anesthetizing  three additional port sites, I put in an 11 mm epigastric port and two 5 mm  right abdominal ports.  I retracted the fundus of the gallbladder toward the  right shoulder and took down the adhesions until I could see the  infundibulum.  I pulled the infundibulum to the right side and then  dissected out the cystic duct as it emerged from the infundibulum and  dissected out the cystic artery as it crossed the triangle of Calot.  The  anatomy was typical and quite clear.  I clipped the cystic artery with the  three clips and cut between the two closest to the gallbladder.  I placed a  single clip on the proximal cystic duct as it emerged from the gallbladder.  I then made a small nick in the cystic  duct and put in a Mixter  cholangiogram catheter and secured that with a clip and performed a  fluoroscopic cholangiogram demonstrating a very large common bile duct and  dilated cystic duct as well.  I had not noted that the gallbladder appeared  to be dilated.  There was delay of flow of the contrast material into the  duodenum, and there was a filling defect in the distal common duct which  moved but did not clear.  I thought it was consistent with a gallstone of  about 3-4 mm.  I thought it could have been an air bubble, so I placed the  patient head-down and foot-up and performed further cholangiography, but  there still appeared to be a filling defect.  Because of the length and  tortuosity of the cystic duct, I did not think I would be able to pass a  catheter through and try to clear the distal common duct.  I resumed  laparoscopy and then removed the clip and the cholangiogram catheter and  clipped  the distal cystic duct with three clips and then divided the cystic  duct at the site of the ductotomy.  I used the cautery and the hook  dissector to remove the gallbladder from the liver and gained good  hemostasis as the dissection proceeded.  I then removed the gallbladder from  the abdomen through the umbilical incision and tied the pursestring  suture.  I removed the two lateral ports under view of the laparoscope and  saw no bleeding from the abdominal Printz.  I allowed the carbon dioxide to  escape and then removed the epigastric port.  I closed all skin incisions  with intracuticular 4-0 Vicryl and Steri-Strips.  The patient was stable  through the operation.      Lorre Munroe., M.D.  Electronically Signed     WB/MEDQ  D:  12/24/2004  T:  12/24/2004  Job:  [id number]   cc:   Corwin Levins, M.D. [facility]  [address]  [place]  [place] [address]   Cassell Clement, M.D.  [address]  [place]  [place] [address]  Fax: [phone]

## 2010-09-13 NOTE — H&P (Signed)
[name], [name]                 ACCOUNT NO.:  [id number]   MEDICAL RECORD NO.:  [id number]          PATIENT TYPE:  INP   LOCATION:  0103                         FACILITY:  [facility]   PHYSICIAN:  Malcolm T. Russella Dar, M.D. [facility] OF BIRTH:  10/27/1950   DATE OF ADMISSION:  01/01/2005  DATE OF DISCHARGE:                                HISTORY & PHYSICAL   CHIEF COMPLAINT:  Nausea, vomiting and right flank pain.   HISTORY:  [name] is a pleasant, generally healthy 60 year old white female  who was hospitalized about a week and half ago at which time she had  undergone laparoscopic cholecystectomy with Dr. Orson Slick.  IOC showed a 2-3 mm  distal common bile duct filling defect and common bile duct dilation to 11  mm.  Ultrasound done on December 12, 2004, had shown a common bile duct of 10  mm and cholelithiasis. ERCP was done postoperatively per Dr. Arlyce Dice on  December 25, 2004, with balloon sweeps of the common bile duct. There was no  stones or debris noted.  Exam was felt to be normal. The patient was  discharged home but says that she has had some mild right flank discomfort  and nausea since discharge and then last evening at about 9:00 p.m.  developed severe right-sided flank discomfort with nausea and vomiting and  presented back to the emergency room. She had not had any fever or chills  but was having severe pain.  She had no complaint of hematemesis. No melena  or hematochezia.   The patient also noted that the sedation was not effective for her ERCP.   Laboratory studies done this morning showed WBC of 17.7, hemoglobin 14.7,  hematocrit 44.0.  Potassium of 3.2, total bilirubin 1.4, alk phos 416, SGOT  of 333, SGPT of 333, albumin 3.9, amylase 108, lipase of 65. Abdominal  ultrasound this morning shows a dilated common bile duct of 13 mm and there  is no filling defect noted, normal pancreas. No evidence of abscess.   CURRENT MEDICATIONS:  1.  Xanax 0.25 nightly.  2.  Percocet q.4h.  p.r.n.   ALLERGIES:  VICODIN which causes a itching.   PAST MEDICAL HISTORY:  1.  Pertinent for C-section x1.  2.  Hysterectomy.  3.  She is had a lumpectomy on the right for her of benign disease.  4.  Right rotator cuff repair.   SOCIAL HISTORY:  The patient is married. Her husband accompanies her today.  She uses occasional alcohol, no tobacco.   FAMILY HISTORY:  Negative for GI disease.   REVIEW OF SYSTEMS:  CARDIOVASCULAR:  Denies any chest pain or anginal  symptoms. PULMONARY:  Negative for cough, shortness breath, or sputum  production. GENITOURINARY:  Negative for dysuria or urgency.  GI:  As above.   PHYSICAL EXAMINATION:  GENERAL APPEARANCE:  Well-developed white female in  no acute distress.  VITAL SIGNS:  Temperature is 97.4 on admission, blood pressure 117/64, pulse  is 66.  HEENT:  Nontraumatic, normocephalic. EOMI, PERRLA.  Sclerae anicteric.  CARDIOVASCULAR:  Regular rhythm with S1 and S2.  PULMONARY:  Clear to A & P.  ABDOMEN:  Soft. She has a healing laparoscopic cholecystectomy incisional  scar. She is mildly tender over the incisions nondistended. Bowel sounds are  active. She has some mild right upper quadrant and right flank discomfort.  No rebound.  RECTAL:  Exam is not done at this time.   IMPRESSION:  50.  A 60 year old female with acute right abdominal and right flank pain      with nausea, vomiting associated with elevated liver function tests and      leukocytosis, one week status post laparoscopic cholecystectomy and      subsequent negative ERCP, suspect retained stone; i.e.,      choledocholithiasis, rule out bile leak though no evidence on ultrasound      for fluid collection.  2.  Status post laparoscopic cholecystectomy December 24, 2004.  3.  Status post hysterectomy.  4.  Status post right rotator cuff repair and previous right lumpectomy.   PLAN:  The patient is admitted to the service Dr. Claudette Head for pain  control, IV antibiotics  and plan for ERCP and sphincterotomy later today.  Will require general anesthesia or propofol given lack of affective sedation  with her last procedure.      Amy Esterwood, P.A.-C. [facility]      Malcolm T. Russella Dar, M.D. [facility]  Electronically Signed    AE/MEDQ  D:  01/01/2005  T:  01/01/2005  Job:  [id number]   cc:   Lorre Munroe., M.D.  [address]  [place]  [place] [address]

## 2010-10-28 ENCOUNTER — Other Ambulatory Visit: Payer: Self-pay | Admitting: Internal Medicine

## 2010-10-31 NOTE — Telephone Encounter (Signed)
Faxed hardcopy to pharmacy. 

## 2010-11-21 ENCOUNTER — Ambulatory Visit (INDEPENDENT_AMBULATORY_CARE_PROVIDER_SITE_OTHER): Payer: BC Managed Care – PPO | Admitting: Internal Medicine

## 2010-11-21 ENCOUNTER — Encounter: Payer: Self-pay | Admitting: Internal Medicine

## 2010-11-21 VITALS — BP 128/60 | HR 87 | Temp 98.1°F | Ht 63.0 in | Wt 144.2 lb

## 2010-11-21 DIAGNOSIS — F329 Major depressive disorder, single episode, unspecified: Secondary | ICD-10-CM

## 2010-11-21 DIAGNOSIS — I10 Essential (primary) hypertension: Secondary | ICD-10-CM

## 2010-11-21 DIAGNOSIS — H1033 Unspecified acute conjunctivitis, bilateral: Secondary | ICD-10-CM | POA: Insufficient documentation

## 2010-11-21 DIAGNOSIS — Z0000 Encounter for general adult medical examination without abnormal findings: Secondary | ICD-10-CM

## 2010-11-21 DIAGNOSIS — Z0001 Encounter for general adult medical examination with abnormal findings: Secondary | ICD-10-CM | POA: Insufficient documentation

## 2010-11-21 DIAGNOSIS — H1030 Unspecified acute conjunctivitis, unspecified eye: Secondary | ICD-10-CM

## 2010-11-21 MED ORDER — TOBRAMYCIN 0.3 % OP SOLN: 1.0000 [drp] | Freq: Four times a day (QID) | OPHTHALMIC | Status: AC

## 2010-11-21 NOTE — Assessment & Plan Note (Signed)
stable overall by hx and exam, most recent data reviewed with pt, and pt to continue medical treatment as before  Lab Results  Component Value Date   WBC 12.4* 08/23/2009   HGB 12.0 08/23/2009   HCT 35.3* 08/23/2009   PLT 249.0 08/23/2009   CHOL 341* 08/23/2009   TRIG 321.0* 08/23/2009   HDL 45.60 08/23/2009   LDLDIRECT 217.2 08/23/2009   ALT 14 08/23/2009   AST 15 08/23/2009   NA 141 08/23/2009   K 4.8 08/23/2009   CL 105 08/23/2009   CREATININE 1.0 08/23/2009   BUN 8 08/23/2009   CO2 29 08/23/2009   TSH 3.81 08/23/2009

## 2010-11-21 NOTE — Patient Instructions (Addendum)
Take all new medications as prescribed. Continue all other medications as before Please return in 6 mo with Lab testing done 3-5 days before  

## 2010-11-21 NOTE — Assessment & Plan Note (Signed)
stable overall by hx and exam, most recent data reviewed with pt, and pt to continue medical treatment as before  BP Readings from Last 3 Encounters:  11/21/10 128/60  01/07/10 120/62  08/23/09 142/62

## 2010-11-21 NOTE — Progress Notes (Signed)
Subjective:    Patient ID: [name], female    DOB: [birth year], 60 y.o.   MRN: [id number]  HPI  Here with 3 days acute onset bilat eye inflammation, with AM matting, discomfort, d/c, eyelid swellling and discomfort, low grade temp and general weakness and malaise; but no vision change, HA, ear or sinus symptoms, ST, cough and Pt denies chest pain, increased sob or doe, wheezing, orthopnea, PND, increased LE swelling, palpitations, dizziness or syncope.  Pt denies new neurological symptoms such as new headache, or facial or extremity weakness or numbness   Pt denies polydipsia, polyuria.  Overall good compliance with treatment, and good medicine tolerability.  Denies worsening depressive symptoms, suicidal ideation, or panic, though has ongoing anxiety, not increased recently.    Past Medical History  Diagnosis Date  . ANXIETY 12/01/2006  . Blepharospasm 12/01/2006  . BONE SPUR 12/01/2006  . Cellulitis and abscess of oral soft tissues 05/01/2008  . Cervicalgia 08/24/2008  . COLD SORE 05/30/2008  . DEPRESSION 12/01/2006  . EXCISION OF GANGLION CYST, WRIST, HX OF 12/01/2006  . GERD 12/01/2006  . HYPERLIPIDEMIA 12/01/2006  . HYPERTENSION, BORDERLINE 12/01/2006  . LOW BACK PAIN 12/01/2006  . MACULAR DEGENERATION 12/01/2006  . OTITIS MEDIA, ACUTE, LEFT 05/30/2008  . PARESTHESIA 08/24/2008  . SINUSITIS- ACUTE-NOS 01/07/2010   Past Surgical History  Procedure Date  . Abdominal hysterectomy   . Breast lumpectomy     right  . Ganglion cyst excision   . Bone spur     removal right foot  . Blepharospasm   . H/o macular degeneration     reports that she has been smoking.  She does not have any smokeless tobacco history on file. She reports that she does not drink alcohol or use illicit drugs. family history includes Diabetes in her daughter; Heart disease in her father; and Hypertension in her mother. Allergies  Allergen Reactions  . Codeine   . Hydrocodone-Acetaminophen   . Loratadine-Pseudoephedrine     Current Outpatient Prescriptions on File Prior to Visit  Medication Sig Dispense Refill  . ALPRAZolam (XANAX) 1 MG tablet TAKE ONE TABLET BY MOUTH TWICE DAILY AS NEEDED  60 tablet  1  . FLUoxetine (PROZAC) 40 MG capsule TAKE ONE CAPSULE BY MOUTH EVERY DAY  90 capsule  0  . simvastatin (ZOCOR) 40 MG tablet TAKE ONE TABLET BY MOUTH EVERY DAY  90 tablet  0   Review of Systems Review of Systems  Constitutional: Negative for diaphoresis and unexpected weight change.  HENT: Negative for drooling and tinnitus.   Eyes: Negative for photophobia and visual disturbance.  Respiratory: Negative for choking and stridor.   Gastrointestinal: Negative for vomiting and blood in stool.  Genitourinary: Negative for hematuria and decreased urine volume.      Objective:   Physical Exam BP 128/60  Pulse 87  Temp(Src) 98.1 F (36.7 C) (Oral)  Ht 5\' 3"  (1.6 m)  Wt 144 lb 4 oz (65.431 kg)  BMI 25.55 kg/m2  SpO2 98% Physical Exam  VS noted Constitutional: Pt appears well-developed and well-nourished.  HENT: Head: Normocephalic. EOMI Right Ear: External ear normal.  Left Ear: External ear normal.  bilat conjunct with mild erythema, slight bilat eyelids swelling, slight d/c Bilat tm's mild erythema.  Sinus nontender.  Pharynx mild erythema Eyes: Conjunctivae and EOM are normal. Pupils are equal, round, and reactive to light.  Neck: Normal range of motion. Neck supple.  Cardiovascular: Normal rate and regular rhythm.   Pulmonary/Chest: Effort normal and  breath sounds normal.  Neurological: Pt is alert. No cranial nerve deficit.  Skin: Skin is warm. No erythema.  Psychiatric: Pt behavior is normal. Thought content normal. not depressed or anxious appearing        Assessment & Plan:

## 2010-12-02 ENCOUNTER — Other Ambulatory Visit: Payer: Self-pay | Admitting: Internal Medicine

## 2011-02-21 ENCOUNTER — Other Ambulatory Visit: Payer: Self-pay | Admitting: Internal Medicine

## 2011-02-21 NOTE — Telephone Encounter (Signed)
Faxed hardcopy to pharmacy. 

## 2011-02-21 NOTE — Telephone Encounter (Signed)
Done hardcopy to robin  

## 2011-02-26 ENCOUNTER — Ambulatory Visit (INDEPENDENT_AMBULATORY_CARE_PROVIDER_SITE_OTHER): Payer: BC Managed Care – PPO | Admitting: Internal Medicine

## 2011-02-26 ENCOUNTER — Encounter: Payer: Self-pay | Admitting: Internal Medicine

## 2011-02-26 VITALS — BP 118/70 | HR 69 | Temp 98.5°F | Ht 63.0 in | Wt 147.1 lb

## 2011-02-26 DIAGNOSIS — W57XXXA Bitten or stung by nonvenomous insect and other nonvenomous arthropods, initial encounter: Secondary | ICD-10-CM

## 2011-02-26 DIAGNOSIS — S30860A Insect bite (nonvenomous) of lower back and pelvis, initial encounter: Secondary | ICD-10-CM

## 2011-02-26 MED ORDER — VALACYCLOVIR HCL 1 G PO TABS: 1000.0000 mg | ORAL_TABLET | Freq: Two times a day (BID) | ORAL | Status: DC

## 2011-02-26 MED ORDER — DOXYCYCLINE HYCLATE 100 MG PO TABS: 100.0000 mg | ORAL_TABLET | Freq: Two times a day (BID) | ORAL | Status: AC

## 2011-02-26 NOTE — Patient Instructions (Signed)
Take all new medications as prescribed Continue all other medications as before Please return in 3 mo with Lab testing done 3-5 days before  

## 2011-02-27 ENCOUNTER — Encounter: Payer: Self-pay | Admitting: Internal Medicine

## 2011-02-27 NOTE — Progress Notes (Signed)
Subjective:    Patient ID: [name], female    DOB: [birth year], 60 y.o.   MRN: [id number]  HPI  Here to f/u after finding a tick to right abdomen yesterday am;  Though it was tick but husband discovered the tick and was removed, since then unfortunately has had rather signficant worsening red, swelling, tender for about 3 cm now around the original bite, but no erythema migrans, fever, red streaks, drainage.  Pt denies chest pain, increased sob or doe, wheezing, orthopnea, PND, increased LE swelling, palpitations, dizziness or syncope.  Pt denies new neurological symptoms such as new headache, or facial or extremity weakness or numbness  Pt denies polydipsia, polyuria. Needs refill valtrex.   Past Medical History  Diagnosis Date  . ANXIETY 12/01/2006  . Blepharospasm 12/01/2006  . BONE SPUR 12/01/2006  . Cellulitis and abscess of oral soft tissues 05/01/2008  . Cervicalgia 08/24/2008  . COLD SORE 05/30/2008  . DEPRESSION 12/01/2006  . EXCISION OF GANGLION CYST, WRIST, HX OF 12/01/2006  . GERD 12/01/2006  . HYPERLIPIDEMIA 12/01/2006  . HYPERTENSION, BORDERLINE 12/01/2006  . LOW BACK PAIN 12/01/2006  . MACULAR DEGENERATION 12/01/2006  . OTITIS MEDIA, ACUTE, LEFT 05/30/2008  . PARESTHESIA 08/24/2008  . SINUSITIS- ACUTE-NOS 01/07/2010   Past Surgical History  Procedure Date  . Abdominal hysterectomy   . Breast lumpectomy     right  . Ganglion cyst excision   . Bone spur     removal right foot  . Blepharospasm   . H/o macular degeneration     reports that she has been smoking.  She does not have any smokeless tobacco history on file. She reports that she does not drink alcohol or use illicit drugs. family history includes Diabetes in her daughter; Heart disease in her father; and Hypertension in her mother. Allergies  Allergen Reactions  . Codeine   . Hydrocodone-Acetaminophen   . Loratadine-Pseudoephedrine    Current Outpatient Prescriptions on File Prior to Visit  Medication Sig Dispense Refill  .  ALPRAZolam (XANAX) 1 MG tablet TAKE ONE TABLET BY MOUTH TWICE DAILY AS NEEDED  60 tablet  1  . aspirin 81 MG tablet Take 81 mg by mouth daily.        Marland Kitchen FLUoxetine (PROZAC) 40 MG capsule TAKE ONE CAPSULE BY MOUTH EVERY DAY  90 capsule  3  . omeprazole (PRILOSEC) 20 MG capsule TAKE TWO CAPSULES BY MOUTH EVERY DAY  180 capsule  3  . simvastatin (ZOCOR) 40 MG tablet TAKE ONE TABLET BY MOUTH EVERY DAY  90 tablet  3  . traMADol (ULTRAM) 50 MG tablet Take 50 mg by mouth every 6 (six) hours as needed.         Review of Systems Review of Systems  Constitutional: Negative for diaphoresis and unexpected weight change.  HENT: Negative for drooling and tinnitus.   Eyes: Negative for photophobia and visual disturbance.  Respiratory: Negative for choking and stridor.   Gastrointestinal: Negative for vomiting and blood in stool.  Genitourinary: Negative for hematuria and decreased urine volume.    Objective:   Physical Exam BP 118/70  Pulse 69  Temp(Src) 98.5 F (36.9 C) (Oral)  Ht 5\' 3"  (1.6 m)  Wt 147 lb 2 oz (66.735 kg)  BMI 26.06 kg/m2  SpO2 97% Physical Exam  VS noted Constitutional: Pt appears well-developed and well-nourished.  HENT: Head: Normocephalic.  Right Ear: External ear normal.  Left Ear: External ear normal.  Eyes: Conjunctivae and EOM are normal. Pupils  are equal, round, and reactive to light.  Neck: Normal range of motion. Neck supple.  Cardiovascular: Normal rate and regular rhythm.   Pulmonary/Chest: Effort normal and breath sounds normal.  Abd:  Soft, NT, non-distended, + BS Neurological: Pt is alert. No cranial nerve deficit.  Skin: Skin is warm. No erythema. except for small insect bite lesion right mid anterior abdomen, with tender erythema about 3 cm surrounding, no real swelling, and no drainage Psychiatric: Pt behavior is normal. Thought content normal.     Assessment & Plan:

## 2011-02-27 NOTE — Assessment & Plan Note (Signed)
Mild to mod, for antibx course,  to f/u any worsening symptoms or concerns 

## 2011-05-14 ENCOUNTER — Ambulatory Visit: Payer: BC Managed Care – PPO | Admitting: Internal Medicine

## 2011-05-28 ENCOUNTER — Ambulatory Visit: Payer: BC Managed Care – PPO | Admitting: Internal Medicine

## 2011-06-03 ENCOUNTER — Other Ambulatory Visit: Payer: Self-pay | Admitting: Internal Medicine

## 2011-06-03 NOTE — Telephone Encounter (Signed)
Faxed hardcopy to pharmacy. 

## 2011-06-03 NOTE — Telephone Encounter (Signed)
Done hardcopy to robin  

## 2011-07-15 ENCOUNTER — Ambulatory Visit: Payer: BC Managed Care – PPO | Admitting: Internal Medicine

## 2011-07-16 ENCOUNTER — Encounter: Payer: Self-pay | Admitting: Internal Medicine

## 2011-07-16 ENCOUNTER — Ambulatory Visit (INDEPENDENT_AMBULATORY_CARE_PROVIDER_SITE_OTHER): Payer: BC Managed Care – PPO | Admitting: Internal Medicine

## 2011-07-16 ENCOUNTER — Other Ambulatory Visit: Payer: Self-pay | Admitting: Internal Medicine

## 2011-07-16 ENCOUNTER — Other Ambulatory Visit (INDEPENDENT_AMBULATORY_CARE_PROVIDER_SITE_OTHER): Payer: BC Managed Care – PPO

## 2011-07-16 VITALS — BP 122/62 | HR 58 | Temp 97.0°F | Ht 63.0 in | Wt 141.2 lb

## 2011-07-16 DIAGNOSIS — E785 Hyperlipidemia, unspecified: Secondary | ICD-10-CM

## 2011-07-16 DIAGNOSIS — Z79899 Other long term (current) drug therapy: Secondary | ICD-10-CM

## 2011-07-16 DIAGNOSIS — Z0000 Encounter for general adult medical examination without abnormal findings: Secondary | ICD-10-CM

## 2011-07-16 DIAGNOSIS — D219 Benign neoplasm of connective and other soft tissue, unspecified: Secondary | ICD-10-CM

## 2011-07-16 DIAGNOSIS — K115 Sialolithiasis: Secondary | ICD-10-CM

## 2011-07-16 LAB — CBC WITH DIFFERENTIAL/PLATELET
Basophils Absolute: 0.1 10*3/uL (ref 0.0–0.1)
Basophils Relative: 0.8 % (ref 0.0–3.0)
Eosinophils Relative: 1.3 % (ref 0.0–5.0)
HCT: 41.2 % (ref 36.0–46.0)
Hemoglobin: 13.7 g/dL (ref 12.0–15.0)
MCHC: 33.3 g/dL (ref 30.0–36.0)
MCV: 87.3 fl (ref 78.0–100.0)
Monocytes Absolute: 0.6 10*3/uL (ref 0.1–1.0)
Monocytes Relative: 5.7 % (ref 3.0–12.0)
Neutro Abs: 6 10*3/uL (ref 1.4–7.7)
RBC: 4.72 Mil/uL (ref 3.87–5.11)
RDW: 14.5 % (ref 11.5–14.6)
WBC: 9.9 10*3/uL (ref 4.5–10.5)

## 2011-07-16 LAB — URINALYSIS, ROUTINE W REFLEX MICROSCOPIC
Bilirubin Urine: NEGATIVE
Ketones, ur: NEGATIVE
Nitrite: NEGATIVE
Total Protein, Urine: NEGATIVE

## 2011-07-16 LAB — TSH: TSH: 3.76 u[IU]/mL (ref 0.35–5.50)

## 2011-07-16 LAB — BASIC METABOLIC PANEL
BUN: 11 mg/dL (ref 6–23)
CO2: 29 mEq/L (ref 19–32)
Calcium: 9.4 mg/dL (ref 8.4–10.5)
Creatinine, Ser: 0.9 mg/dL (ref 0.4–1.2)

## 2011-07-16 LAB — HEPATIC FUNCTION PANEL: AST: 18 U/L (ref 0–37)

## 2011-07-16 LAB — LIPID PANEL: VLDL: 101.4 mg/dL — ABNORMAL HIGH (ref 0.0–40.0)

## 2011-07-16 MED ORDER — ATORVASTATIN CALCIUM 80 MG PO TABS: 80.0000 mg | ORAL_TABLET | Freq: Every day | ORAL | Status: DC

## 2011-07-16 MED ORDER — VALACYCLOVIR HCL 1 G PO TABS: 1000.0000 mg | ORAL_TABLET | Freq: Two times a day (BID) | ORAL | Status: DC

## 2011-07-16 MED ORDER — TRAMADOL HCL 50 MG PO TABS: 50.0000 mg | ORAL_TABLET | Freq: Four times a day (QID) | ORAL | Status: DC | PRN

## 2011-07-16 MED ORDER — SIMVASTATIN 40 MG PO TABS: 40.0000 mg | ORAL_TABLET | Freq: Every day | ORAL | Status: DC

## 2011-07-16 MED ORDER — OMEPRAZOLE 20 MG PO CPDR: 20.0000 mg | DELAYED_RELEASE_CAPSULE | Freq: Two times a day (BID) | ORAL | Status: DC | PRN

## 2011-07-16 MED ORDER — ALPRAZOLAM 1 MG PO TABS: 1.0000 mg | ORAL_TABLET | Freq: Two times a day (BID) | ORAL | Status: DC | PRN

## 2011-07-16 MED ORDER — FLUOXETINE HCL 40 MG PO CAPS: 40.0000 mg | ORAL_CAPSULE | Freq: Every day | ORAL | Status: DC

## 2011-07-16 NOTE — Patient Instructions (Addendum)
Continue all other medications as before Please have the pharmacy call with any refills you may need., though all of your refills are done today Please go to LAB in the Basement for the blood and/or urine tests to be done today You will be contacted by phone if any changes need to be made immediately.  Otherwise, you will receive a letter about your results with an explanation. Please call if you need referral to ENT Please call if you change your mind and would like schedule the screening colonoscopy Please return in 1 year for your yearly visit, or sooner if needed, with Lab testing done 3-5 days before

## 2011-07-20 ENCOUNTER — Encounter: Payer: Self-pay | Admitting: Internal Medicine

## 2011-07-20 DIAGNOSIS — K115 Sialolithiasis: Secondary | ICD-10-CM | POA: Insufficient documentation

## 2011-07-20 DIAGNOSIS — D3610 Benign neoplasm of peripheral nerves and autonomic nervous system, unspecified: Secondary | ICD-10-CM | POA: Insufficient documentation

## 2011-07-20 NOTE — Assessment & Plan Note (Signed)
Right clavicle area, for repeat surgury soon for recurrence

## 2011-07-20 NOTE — Assessment & Plan Note (Signed)

## 2011-07-20 NOTE — Assessment & Plan Note (Signed)
Probable, to consider ent, declines for now

## 2011-07-20 NOTE — Assessment & Plan Note (Signed)
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

## 2011-07-20 NOTE — Progress Notes (Signed)
Subjective:    Patient ID: [name], female    DOB: 1951-03-21, 61 y.o.   MRN: [id number]  HPI  Here for wellness and f/u;  Overall doing ok;  Pt denies CP, worsening SOB, DOE, wheezing, orthopnea, PND, worsening LE edema, palpitations, dizziness or syncope.  Pt denies neurological change such as new Headache, facial or extremity weakness.  Pt denies polydipsia, polyuria, or low sugar symptoms. Pt states overall good compliance with treatment and medications, good tolerability, and trying to follow lower cholesterol diet.  Pt denies worsening depressive symptoms, suicidal ideation or panic. No fever, wt loss, night sweats, loss of appetite, or other constitutional symptoms.  Pt states good ability with ADL's, low fall risk, home safety reviewed and adequate, no significant changes in hearing or vision, and occasionally active with exercise.  Did have recent left salivary gland infection, stone, now improved after antibx, has not seen ENT.  Trying to follow lower chol diet, declines colonoscopy Past Medical History  Diagnosis Date  . ANXIETY 12/01/2006  . Blepharospasm 12/01/2006  . BONE SPUR 12/01/2006  . Cellulitis and abscess of oral soft tissues 05/01/2008  . Cervicalgia 08/24/2008  . COLD SORE 05/30/2008  . DEPRESSION 12/01/2006  . EXCISION OF GANGLION CYST, WRIST, HX OF 12/01/2006  . GERD 12/01/2006  . HYPERLIPIDEMIA 12/01/2006  . HYPERTENSION, BORDERLINE 12/01/2006  . LOW BACK PAIN 12/01/2006  . MACULAR DEGENERATION 12/01/2006  . OTITIS MEDIA, ACUTE, LEFT 05/30/2008  . PARESTHESIA 08/24/2008  . SINUSITIS- ACUTE-NOS 01/07/2010   Past Surgical History  Procedure Date  . Abdominal hysterectomy   . Breast lumpectomy     right  . Ganglion cyst excision   . Bone spur     removal right foot  . Blepharospasm   . H/o macular degeneration     reports that she has been smoking.  She does not have any smokeless tobacco history on file. She reports that she does not drink alcohol or use illicit drugs. family  history includes Diabetes in her daughter; Heart disease in her father; and Hypertension in her mother. Allergies  Allergen Reactions  . Codeine   . Hydrocodone-Acetaminophen   . Loratadine-Pseudoephedrine    Current Outpatient Prescriptions on File Prior to Visit  Medication Sig Dispense Refill  . aspirin 81 MG tablet Take 81 mg by mouth daily.         Review of Systems Review of Systems  Constitutional: Negative for diaphoresis, activity change, appetite change and unexpected weight change.  HENT: Negative for hearing loss, ear pain, facial swelling, mouth sores and neck stiffness.   Eyes: Negative for pain, redness and visual disturbance.  Respiratory: Negative for shortness of breath and wheezing.   Cardiovascular: Negative for chest pain and palpitations.  Gastrointestinal: Negative for diarrhea, blood in stool, abdominal distention and rectal pain.  Genitourinary: Negative for hematuria, flank pain and decreased urine volume.  Musculoskeletal: Negative for myalgias and joint swelling.  Skin: Negative for color change and wound.  Neurological: Negative for syncope and numbness.  Hematological: Negative for adenopathy.  Psychiatric/Behavioral: Negative for hallucinations, self-injury, decreased concentration and agitation.      Objective:   Physical Exam BP 122/62  Pulse 58  Temp(Src) 97 F (36.1 C) (Oral)  Ht 5\' 3"  (1.6 m)  Wt 141 lb 4 oz (64.071 kg)  BMI 25.02 kg/m2  SpO2 97% Physical Exam  VS noted Constitutional: Pt is oriented to person, place, and time. Appears well-developed and well-nourished.  HENT:  Head: Normocephalic and atraumatic.  Right Ear: External ear normal.  Left Ear: External ear normal.  Nose: Nose normal.  Mouth/Throat: Oropharynx is clear and moist.  Eyes: Conjunctivae and EOM are normal. Pupils are equal, round, and reactive to light.  Neck: Normal range of motion. Neck supple. No JVD present. No tracheal deviation present.  Cardiovascular:  Normal rate, regular rhythm, normal heart sounds and intact distal pulses.  except for gr 1-2/6 syst Murmur, chronic Pulmonary/Chest: Effort normal and breath sounds normal.  Abdominal: Soft. Bowel sounds are normal. There is no tenderness.  Musculoskeletal: Normal range of motion. Exhibits no edema.  Lymphadenopathy:  Has no cervical adenopathy.  Neurological: Pt is alert and oriented to person, place, and time. Pt has normal reflexes. No cranial nerve deficit.  Skin: Skin is warm and dry. No rash noted.  Psychiatric:  Has  normal mood and affect. Behavior is normal.     Assessment & Plan:

## 2011-09-09 ENCOUNTER — Telehealth: Payer: Self-pay

## 2011-09-09 MED ORDER — AZITHROMYCIN 250 MG PO TABS: ORAL_TABLET | ORAL | Status: AC

## 2011-09-09 NOTE — Telephone Encounter (Signed)
Patient was seen on 07/16/11 and had swollen glands at that visit. Today the patient called and informed glands are now more swollen and has sore throat.  She would like something sent in to [facility]. Call back number is [phone]

## 2011-09-09 NOTE — Telephone Encounter (Signed)
Ok this time only 

## 2011-09-10 NOTE — Telephone Encounter (Signed)
Patient informed. 

## 2011-09-11 ENCOUNTER — Ambulatory Visit: Payer: BC Managed Care – PPO | Admitting: Internal Medicine

## 2012-01-20 ENCOUNTER — Other Ambulatory Visit: Payer: Self-pay | Admitting: Internal Medicine

## 2012-01-20 NOTE — Telephone Encounter (Signed)
Done hardcopy to robin  

## 2012-01-20 NOTE — Telephone Encounter (Signed)
Faxed hardcopy to pharmacy. 

## 2012-07-08 ENCOUNTER — Other Ambulatory Visit: Payer: Self-pay | Admitting: Internal Medicine

## 2012-07-08 NOTE — Telephone Encounter (Signed)
Done hardcopy to robin  Due for ROV 

## 2012-07-08 NOTE — Telephone Encounter (Signed)
Faxed hardcopy to pharmacy. 

## 2012-08-06 ENCOUNTER — Other Ambulatory Visit: Payer: Self-pay | Admitting: Internal Medicine

## 2012-08-30 ENCOUNTER — Other Ambulatory Visit: Payer: Self-pay | Admitting: Internal Medicine

## 2012-10-07 ENCOUNTER — Other Ambulatory Visit: Payer: Self-pay | Admitting: Internal Medicine

## 2012-10-13 ENCOUNTER — Encounter: Payer: Self-pay | Admitting: Internal Medicine

## 2012-10-13 ENCOUNTER — Ambulatory Visit (INDEPENDENT_AMBULATORY_CARE_PROVIDER_SITE_OTHER): Payer: BC Managed Care – PPO | Admitting: Internal Medicine

## 2012-10-13 ENCOUNTER — Ambulatory Visit (INDEPENDENT_AMBULATORY_CARE_PROVIDER_SITE_OTHER): Payer: BC Managed Care – PPO

## 2012-10-13 VITALS — BP 132/80 | HR 66 | Temp 97.9°F | Ht 62.0 in | Wt 141.5 lb

## 2012-10-13 DIAGNOSIS — E785 Hyperlipidemia, unspecified: Secondary | ICD-10-CM

## 2012-10-13 DIAGNOSIS — Z0000 Encounter for general adult medical examination without abnormal findings: Secondary | ICD-10-CM

## 2012-10-13 LAB — CBC WITH DIFFERENTIAL/PLATELET
Basophils Relative: 0.6 % (ref 0.0–3.0)
Eosinophils Relative: 1.9 % (ref 0.0–5.0)
Lymphocytes Relative: 34.3 % (ref 12.0–46.0)
MCV: 90.7 fl (ref 78.0–100.0)
Neutrophils Relative %: 57.3 % (ref 43.0–77.0)
RBC: 4.41 Mil/uL (ref 3.87–5.11)
WBC: 9.9 10*3/uL (ref 4.5–10.5)

## 2012-10-13 LAB — BASIC METABOLIC PANEL
Calcium: 8.6 mg/dL (ref 8.4–10.5)
GFR: 71.51 mL/min (ref >60.00)
Potassium: 3.4 mEq/L — ABNORMAL LOW (ref 3.5–5.1)
Sodium: 132 mEq/L — ABNORMAL LOW (ref 135–145)

## 2012-10-13 LAB — LIPID PANEL
HDL: 43.5 mg/dL (ref >39.00)
Total CHOL/HDL Ratio: 9
Triglycerides: 387 mg/dL — ABNORMAL HIGH (ref 0.0–149.0)

## 2012-10-13 LAB — URINALYSIS, ROUTINE W REFLEX MICROSCOPIC
Nitrite: NEGATIVE
Specific Gravity, Urine: <=1.005 (ref 1.000–1.030)
Total Protein, Urine: NEGATIVE
pH: 6 (ref 5.0–8.0)

## 2012-10-13 LAB — HEPATIC FUNCTION PANEL
ALT: 14 U/L (ref 0–35)
Total Bilirubin: 0.5 mg/dL (ref 0.3–1.2)
Total Protein: 7.4 g/dL (ref 6.0–8.3)

## 2012-10-13 MED ORDER — ALPRAZOLAM 1 MG PO TABS: ORAL_TABLET | ORAL | Status: DC

## 2012-10-13 MED ORDER — OMEPRAZOLE 20 MG PO CPDR: 20.0000 mg | DELAYED_RELEASE_CAPSULE | Freq: Two times a day (BID) | ORAL | Status: DC | PRN

## 2012-10-13 MED ORDER — FLUOXETINE HCL 40 MG PO CAPS: 40.0000 mg | ORAL_CAPSULE | Freq: Every day | ORAL | Status: DC

## 2012-10-13 MED ORDER — VALACYCLOVIR HCL 1 G PO TABS: ORAL_TABLET | ORAL | Status: DC

## 2012-10-13 MED ORDER — ATORVASTATIN CALCIUM 20 MG PO TABS: 20.0000 mg | ORAL_TABLET | Freq: Every day | ORAL | Status: DC

## 2012-10-13 NOTE — Assessment & Plan Note (Signed)
Severe, persistent for many yrs, needs to quit smoking, intol of one statin yrs ago but cannot recall name, did not start lipitor as requested last yr but now ok - for lipitor 20 qd

## 2012-10-13 NOTE — Patient Instructions (Addendum)
Please take all new medication as prescribed  - the Lipitor at 20 mg per day  Please continue all other medications as before, and refills have been done if requested.  Please have the pharmacy call with any other refills you may need.  Please continue your efforts at being more active, low cholesterol diet, and weight control.  You are otherwise up to date with prevention measures today.  Please call if you change your mind about having the colonoscopy  Please stop smoking  Please remember to followup with your GYN for the yearly pap smear and/or mammogram as you do  Please go to the LAB in the Basement (turn left off the elevator) for the tests to be done today  You will be contacted by phone if any changes need to be made immediately.  Otherwise, you will receive a letter about your results with an explanation, but please check with MyChart first.  Please remember to sign up for My Chart if you have not done so, as this will be important to you in the future with finding out test results, communicating by private email, and scheduling acute appointments online when needed.  Please return in 1 year for your yearly visit, or sooner if needed

## 2012-10-13 NOTE — Progress Notes (Signed)
Subjective:    Patient ID: [name], female    DOB: 01-Jul-1950, 62 y.o.   MRN: [id number]  HPI Here for wellness and f/u;  Overall doing ok;  Pt denies CP, worsening SOB, DOE, wheezing, orthopnea, PND, worsening LE edema, palpitations, dizziness or syncope.  Pt denies neurological change such as new headache, facial or extremity weakness.  Pt denies polydipsia, polyuria, or low sugar symptoms. Pt states overall good compliance with treatment and medications, good tolerability, and has been trying to follow lower cholesterol diet.  Pt denies worsening depressive symptoms, suicidal ideation or panic. No fever, night sweats, wt loss, loss of appetite, or other constitutional symptoms.  Pt states good ability with ADL's, has low fall risk, home safety reviewed and adequate, no other significant changes in hearing or vision, and only occasionally active with exercise. Denies worsening depressive symptoms, suicidal ideation, or panic; has ongoing anxiety, not increased recently.  Still smoking. Adamantly declines colonoscopy. Is willing to try statin for cholesterol Past Medical History  Diagnosis Date  . ANXIETY 12/01/2006  . Blepharospasm 12/01/2006  . BONE SPUR 12/01/2006  . Cellulitis and abscess of oral soft tissues 05/01/2008  . Cervicalgia 08/24/2008  . COLD SORE 05/30/2008  . DEPRESSION 12/01/2006  . EXCISION OF GANGLION CYST, WRIST, HX OF 12/01/2006  . GERD 12/01/2006  . HYPERLIPIDEMIA 12/01/2006  . HYPERTENSION, BORDERLINE 12/01/2006  . LOW BACK PAIN 12/01/2006  . MACULAR DEGENERATION 12/01/2006  . OTITIS MEDIA, ACUTE, LEFT 05/30/2008  . PARESTHESIA 08/24/2008  . SINUSITIS- ACUTE-NOS 01/07/2010   Past Surgical History  Procedure Laterality Date  . Abdominal hysterectomy    . Breast lumpectomy      right  . Ganglion cyst excision    . Bone spur      removal right foot  . Blepharospasm    . H/o macular degeneration      reports that she has been smoking.  She does not have any smokeless tobacco history  on file. She reports that she does not drink alcohol or use illicit drugs. family history includes Diabetes in her daughter; Heart disease in her father; and Hypertension in her mother. Allergies  Allergen Reactions  . Codeine   . Hydrocodone-Acetaminophen   . Loratadine-Pseudoephedrine Er    Current Outpatient Prescriptions on File Prior to Visit  Medication Sig Dispense Refill  . ALPRAZolam (XANAX) 1 MG tablet TAKE ONE TABLET BY MOUTH TWICE DAILY AS NEEDED  60 tablet  0  . aspirin 81 MG tablet Take 81 mg by mouth daily.        . valACYclovir (VALTREX) 1000 MG tablet TAKE ONE TABLET BY MOUTH TWICE DAILY  60 tablet  0  . traMADol (ULTRAM) 50 MG tablet Take 1 tablet (50 mg total) by mouth every 6 (six) hours as needed.  120 tablet  2   No current facility-administered medications on file prior to visit.   Review of Systems Constitutional: Negative for diaphoresis, activity change, appetite change or unexpected weight change.  HENT: Negative for hearing loss, ear pain, facial swelling, mouth sores and neck stiffness.   Eyes: Negative for pain, redness and visual disturbance.  Respiratory: Negative for shortness of breath and wheezing.   Cardiovascular: Negative for chest pain and palpitations.  Gastrointestinal: Negative for diarrhea, blood in stool, abdominal distention or other pain Genitourinary: Negative for hematuria, flank pain or change in urine volume.  Musculoskeletal: Negative for myalgias and joint swelling.  Skin: Negative for color change and wound.  Neurological: Negative for  syncope and numbness. other than noted Hematological: Negative for adenopathy.  Psychiatric/Behavioral: Negative for hallucinations, self-injury, decreased concentration and agitation.      Objective:   Physical Exam BP 132/80  Pulse 66  Temp(Src) 97.9 F (36.6 C) (Oral)  Ht 5\' 2"  (1.575 m)  Wt 141 lb 8 oz (64.184 kg)  BMI 25.87 kg/m2  SpO2 96% VS noted,  Constitutional: Pt is oriented to  person, place, and time. Appears well-developed and well-nourished.  Head: Normocephalic and atraumatic.  Right Ear: External ear normal.  Left Ear: External ear normal.  Nose: Nose normal.  Mouth/Throat: Oropharynx is clear and moist.  Eyes: Conjunctivae and EOM are normal. Pupils are equal, round, and reactive to light.  Neck: Normal range of motion. Neck supple. No JVD present. No tracheal deviation present.  Cardiovascular: Normal rate, regular rhythm, normal heart sounds and intact distal pulses.   Pulmonary/Chest: Effort normal and breath sounds normal.  Abdominal: Soft. Bowel sounds are normal. There is no tenderness. No HSM  Musculoskeletal: Normal range of motion. Exhibits no edema.  Lymphadenopathy:  Has no cervical adenopathy.  Neurological: Pt is alert and oriented to person, place, and time. Pt has normal reflexes. No cranial nerve deficit.  Skin: Skin is warm and dry. No rash noted.  Psychiatric:  Has  normal mood and affect. Behavior is normal. 1+ nervous, ? Mild dysphoric    Assessment & Plan:

## 2012-10-13 NOTE — Assessment & Plan Note (Signed)

## 2012-10-14 ENCOUNTER — Encounter: Payer: Self-pay | Admitting: Internal Medicine

## 2012-12-17 ENCOUNTER — Encounter: Payer: Self-pay | Admitting: Internal Medicine

## 2012-12-17 ENCOUNTER — Ambulatory Visit (INDEPENDENT_AMBULATORY_CARE_PROVIDER_SITE_OTHER): Payer: 59 | Admitting: Internal Medicine

## 2012-12-17 VITALS — BP 120/80 | HR 79 | Temp 98.6°F | Ht 63.0 in | Wt 143.2 lb

## 2012-12-17 DIAGNOSIS — E785 Hyperlipidemia, unspecified: Secondary | ICD-10-CM

## 2012-12-17 DIAGNOSIS — F329 Major depressive disorder, single episode, unspecified: Secondary | ICD-10-CM

## 2012-12-17 DIAGNOSIS — I10 Essential (primary) hypertension: Secondary | ICD-10-CM

## 2012-12-17 DIAGNOSIS — F411 Generalized anxiety disorder: Secondary | ICD-10-CM

## 2012-12-17 MED ORDER — ATORVASTATIN CALCIUM 80 MG PO TABS: 80.0000 mg | ORAL_TABLET | Freq: Every day | ORAL | Status: DC

## 2012-12-17 MED ORDER — CLONAZEPAM 1 MG PO TABS: ORAL_TABLET | ORAL | Status: DC

## 2012-12-17 NOTE — Progress Notes (Signed)
  Subjective:    Patient ID: [name], female    DOB: 18-Oct-1950, 62 y.o.   MRN: [id number]  HPI  Here for f/u; c/o worsening anxiety/depression -  Mother died june1 unexpectantly with pulm fibrosis.  Now with graudally worsening anxiety, brother with ETOH issues, she has occasional palpitatoins, shakiness, hard to concentrate, worse in the afternoon for unclear reasons,  Xanax not helping recetnly but duaghter's klonopin helped a lot and resolved all symptoms of palp and anxiety last evening,  Overall otherwise good compliance with treatment, and good medicine tolerability, including the prozac , Very depressed but cannot cry she thinks due to the prozac, and less sex desire. Also not sleeping well but is chronic and takes sleep med.  Denies suicidal ideation, or panic.  Pt denies chest pain, increased sob or doe, wheezing, orthopnea, PND, increased LE swelling, dizziness or syncope.  Pt denies polydipsia, polyuria  Pt denies new neurological symptoms such as new headache, or facial or extremity weakness or numbness.   Pt states has been trying to follow lower cholesterol diet, with wt overall stable.  Getting enough exercise remains a challenge. Past Medical History  Diagnosis Date  . ANXIETY 12/01/2006  . Blepharospasm 12/01/2006  . BONE SPUR 12/01/2006  . Cellulitis and abscess of oral soft tissues 05/01/2008  . Cervicalgia 08/24/2008  . COLD SORE 05/30/2008  . DEPRESSION 12/01/2006  . EXCISION OF GANGLION CYST, WRIST, HX OF 12/01/2006  . GERD 12/01/2006  . HYPERLIPIDEMIA 12/01/2006  . HYPERTENSION, BORDERLINE 12/01/2006  . LOW BACK PAIN 12/01/2006  . MACULAR DEGENERATION 12/01/2006  . OTITIS MEDIA, ACUTE, LEFT 05/30/2008  . PARESTHESIA 08/24/2008  . SINUSITIS- ACUTE-NOS 01/07/2010   Past Surgical History  Procedure Laterality Date  . Abdominal hysterectomy    . Breast lumpectomy      right  . Ganglion cyst excision    . Bone spur      removal right foot  . Blepharospasm    . H/o macular degeneration       reports that she has been smoking.  She does not have any smokeless tobacco history on file. She reports that she does not drink alcohol or use illicit drugs. family history includes Diabetes in her daughter; Heart disease in her father; Hypertension in her mother. Allergies  Allergen Reactions  . Codeine   . Hydrocodone-Acetaminophen   . Loratadine-Pseudoephedrine Er    Current Outpatient Prescriptions on File Prior to Visit  Medication Sig Dispense Refill  . aspirin 81 MG tablet Take 81 mg by mouth daily.        Marland Kitchen omeprazole (PRILOSEC) 20 MG capsule Take 1 capsule (20 mg total) by mouth 2 (two) times daily as needed.  180 capsule  3  . valACYclovir (VALTREX) 1000 MG tablet TAKE ONE TABLET BY MOUTH TWICE DAILY  60 tablet  2  . FLUoxetine (PROZAC) 40 MG capsule Take 1 capsule (40 mg total) by mouth daily.  90 capsule  3   No current facility-administered medications on file prior to visit.   Review of Systems     Objective:   Physical Exam        Assessment & Plan:

## 2012-12-17 NOTE — Patient Instructions (Signed)
OK to stop the xanax Please take all new medication as prescribed - the klonopin as prescribed Please continue all other medications as before, and refills have been done if requested. The lipitor prescrioption for you and Amada Jupiter were corrected today Please consider calling Hospice that was involved with your mother to inquire about counseling  Please remember to sign up for My Chart if you have not done so, as this will be important to you in the future with finding out test results, communicating by private email, and scheduling acute appointments online when needed.

## 2012-12-18 NOTE — Assessment & Plan Note (Signed)
Ok for change xanax to klonopin asd,  to f/u any worsening symptoms or concerns, suggested she take advantage of cousneling if available through hospice with which she has recently been involved at her mother's death

## 2012-12-18 NOTE — Assessment & Plan Note (Signed)
stable overall by history and exam, recent data reviewed with pt, and pt to continue medical treatment as before,  to f/u any worsening symptoms or concerns BP Readings from Last 3 Encounters:  12/17/12 120/80  10/13/12 132/80  07/16/11 122/62

## 2012-12-18 NOTE — Assessment & Plan Note (Signed)
Denies SI, no plan, cont prozac, declines other psychiatric referral

## 2012-12-18 NOTE — Assessment & Plan Note (Signed)
Med clarified, needs the 80 mg lipitor o/w stable overall by history and exam, recent data reviewed with pt, and pt to continue medical treatment as before,  to f/u any worsening symptoms or concerns Lab Results  Component Value Date   CHOL 377* 10/13/2012   HDL 43.50 10/13/2012   LDLDIRECT 264.1 10/13/2012   TRIG 387.0* 10/13/2012   CHOLHDL 9 10/13/2012

## 2013-01-27 ENCOUNTER — Other Ambulatory Visit (HOSPITAL_BASED_OUTPATIENT_CLINIC_OR_DEPARTMENT_OTHER): Payer: Self-pay | Admitting: *Deleted

## 2013-01-27 DIAGNOSIS — Z1231 Encounter for screening mammogram for malignant neoplasm of breast: Secondary | ICD-10-CM

## 2013-01-28 ENCOUNTER — Other Ambulatory Visit (HOSPITAL_BASED_OUTPATIENT_CLINIC_OR_DEPARTMENT_OTHER): Payer: Self-pay | Admitting: Obstetrics & Gynecology

## 2013-01-28 ENCOUNTER — Ambulatory Visit (HOSPITAL_BASED_OUTPATIENT_CLINIC_OR_DEPARTMENT_OTHER)
Admission: RE | Admit: 2013-01-28 | Discharge: 2013-01-28 | Disposition: A | Discharge status: Home / Self Care | Payer: 59 | Source: Ambulatory Visit | Attending: Internal Medicine | Admitting: Internal Medicine

## 2013-01-28 DIAGNOSIS — Z1231 Encounter for screening mammogram for malignant neoplasm of breast: Secondary | ICD-10-CM

## 2013-05-23 ENCOUNTER — Other Ambulatory Visit: Payer: Self-pay | Admitting: Internal Medicine

## 2013-05-23 NOTE — Telephone Encounter (Signed)
Faxed script back to [facility].../lmb 

## 2013-05-24 ENCOUNTER — Telehealth: Payer: Self-pay

## 2013-05-24 NOTE — Telephone Encounter (Signed)
Fortunately the recommendations have now changed, and you should no longer need antibx with dental work, as this is usually done now only if there congenital heart defects

## 2013-05-24 NOTE — Telephone Encounter (Signed)
Called left a detailed message of MD instrucitons.

## 2013-05-24 NOTE — Telephone Encounter (Signed)
The patient called the triage line on 05/23/13 and stated on Wednesday she was having dental work done. She stated she usually has an antibiotic called in before any dental procedure due to her heart condition.  She is hoping this antibiotic can be called into the [facility] on [address].   Callback for pt - [phone]

## 2013-06-29 ENCOUNTER — Other Ambulatory Visit: Payer: Self-pay | Admitting: Internal Medicine

## 2013-06-29 NOTE — Telephone Encounter (Signed)
Faxed hardcopy to [facility] [place]  

## 2013-06-29 NOTE — Telephone Encounter (Signed)
Done hardcopy to robin  

## 2013-12-13 ENCOUNTER — Other Ambulatory Visit: Payer: Self-pay | Admitting: Internal Medicine

## 2013-12-28 ENCOUNTER — Other Ambulatory Visit: Payer: Self-pay | Admitting: Internal Medicine

## 2014-01-26 ENCOUNTER — Other Ambulatory Visit: Payer: Self-pay | Admitting: Internal Medicine

## 2014-01-30 ENCOUNTER — Other Ambulatory Visit: Payer: Self-pay | Admitting: Internal Medicine

## 2014-02-02 ENCOUNTER — Telehealth: Payer: Self-pay | Admitting: Internal Medicine

## 2014-02-02 MED ORDER — CLONAZEPAM 1 MG PO TABS: ORAL_TABLET | ORAL | Status: DC

## 2014-02-02 NOTE — Telephone Encounter (Signed)
Done hardcopy to robin  

## 2014-02-02 NOTE — Telephone Encounter (Signed)
Patient is requesting a med refill

## 2014-02-02 NOTE — Telephone Encounter (Signed)
Pt has made appt for her CPX 02/14/14. She is wanting refill on her clonazepam. Pls advise...Johny Chess

## 2014-02-02 NOTE — Telephone Encounter (Signed)
Faxed hardcopy for Clonazepam to [facility] Dr. Letta Kocher [place].  Called the patient informed sent in as requested

## 2014-02-06 ENCOUNTER — Other Ambulatory Visit (HOSPITAL_BASED_OUTPATIENT_CLINIC_OR_DEPARTMENT_OTHER): Payer: Self-pay | Admitting: Obstetrics & Gynecology

## 2014-02-06 DIAGNOSIS — Z1239 Encounter for other screening for malignant neoplasm of breast: Secondary | ICD-10-CM

## 2014-02-07 ENCOUNTER — Ambulatory Visit (HOSPITAL_BASED_OUTPATIENT_CLINIC_OR_DEPARTMENT_OTHER)
Admission: RE | Admit: 2014-02-07 | Discharge: 2014-02-07 | Disposition: A | Discharge status: Home / Self Care | Payer: 59 | Source: Ambulatory Visit | Attending: Obstetrics & Gynecology | Admitting: Obstetrics & Gynecology

## 2014-02-07 DIAGNOSIS — Z1231 Encounter for screening mammogram for malignant neoplasm of breast: Secondary | ICD-10-CM | POA: Insufficient documentation

## 2014-02-07 DIAGNOSIS — Z1239 Encounter for other screening for malignant neoplasm of breast: Secondary | ICD-10-CM

## 2014-02-08 LAB — HM MAMMOGRAPHY

## 2014-02-14 ENCOUNTER — Encounter: Payer: Self-pay | Admitting: Internal Medicine

## 2014-02-14 ENCOUNTER — Ambulatory Visit (INDEPENDENT_AMBULATORY_CARE_PROVIDER_SITE_OTHER): Payer: 59 | Admitting: Internal Medicine

## 2014-02-14 ENCOUNTER — Other Ambulatory Visit (INDEPENDENT_AMBULATORY_CARE_PROVIDER_SITE_OTHER): Payer: 59

## 2014-02-14 ENCOUNTER — Ambulatory Visit (INDEPENDENT_AMBULATORY_CARE_PROVIDER_SITE_OTHER)
Admission: RE | Admit: 2014-02-14 | Discharge: 2014-02-14 | Disposition: A | Discharge status: Home / Self Care | Payer: 59 | Source: Ambulatory Visit | Attending: Internal Medicine | Admitting: Internal Medicine

## 2014-02-14 VITALS — BP 132/70 | HR 71 | Temp 98.3°F | Ht 63.0 in | Wt 142.4 lb

## 2014-02-14 DIAGNOSIS — Z0189 Encounter for other specified special examinations: Secondary | ICD-10-CM

## 2014-02-14 DIAGNOSIS — R05 Cough: Secondary | ICD-10-CM

## 2014-02-14 DIAGNOSIS — Z0000 Encounter for general adult medical examination without abnormal findings: Secondary | ICD-10-CM

## 2014-02-14 DIAGNOSIS — R059 Cough, unspecified: Secondary | ICD-10-CM

## 2014-02-14 LAB — LIPID PANEL
CHOLESTEROL: 383 mg/dL — AB (ref 0–200)
HDL: 41.8 mg/dL (ref >39.00)
NonHDL: 341.2
Total CHOL/HDL Ratio: 9
Triglycerides: 719 mg/dL — ABNORMAL HIGH (ref 0.0–149.0)
VLDL: 143.8 mg/dL — ABNORMAL HIGH (ref 0.0–40.0)

## 2014-02-14 LAB — CBC WITH DIFFERENTIAL/PLATELET
Basophils Absolute: 0.1 10*3/uL (ref 0.0–0.1)
Basophils Relative: 0.7 % (ref 0.0–3.0)
EOS ABS: 0.1 10*3/uL (ref 0.0–0.7)
Eosinophils Relative: 1.1 % (ref 0.0–5.0)
HCT: 40.8 % (ref 36.0–46.0)
Hemoglobin: 13.5 g/dL (ref 12.0–15.0)
Lymphocytes Relative: 30.2 % (ref 12.0–46.0)
Lymphs Abs: 3.3 10*3/uL (ref 0.7–4.0)
MCHC: 33.1 g/dL (ref 30.0–36.0)
MCV: 88.1 fl (ref 78.0–100.0)
Monocytes Absolute: 0.7 10*3/uL (ref 0.1–1.0)
Monocytes Relative: 6.2 % (ref 3.0–12.0)
Neutro Abs: 6.9 10*3/uL (ref 1.4–7.7)
Neutrophils Relative %: 61.8 % (ref 43.0–77.0)
PLATELETS: 283 10*3/uL (ref 150.0–400.0)
RBC: 4.63 Mil/uL (ref 3.87–5.11)
RDW: 14.4 % (ref 11.5–15.5)
WBC: 11.1 10*3/uL — AB (ref 4.0–10.5)

## 2014-02-14 LAB — BASIC METABOLIC PANEL
BUN: 11 mg/dL (ref 6–23)
CALCIUM: 9.4 mg/dL (ref 8.4–10.5)
CO2: 27 mEq/L (ref 19–32)
Chloride: 103 mEq/L (ref 96–112)
Creatinine, Ser: 0.9 mg/dL (ref 0.4–1.2)
GFR: 71.19 mL/min (ref >60.00)
GLUCOSE: 82 mg/dL (ref 70–99)
Potassium: 4.1 mEq/L (ref 3.5–5.1)
SODIUM: 138 meq/L (ref 135–145)

## 2014-02-14 LAB — URINALYSIS, ROUTINE W REFLEX MICROSCOPIC
BILIRUBIN URINE: NEGATIVE
Ketones, ur: NEGATIVE
LEUKOCYTES UA: NEGATIVE
NITRITE: NEGATIVE
SPECIFIC GRAVITY, URINE: 1.015 (ref 1.000–1.030)
Total Protein, Urine: NEGATIVE
Urine Glucose: NEGATIVE
Urobilinogen, UA: 0.2 (ref 0.0–1.0)
WBC, UA: NONE SEEN (ref 0–?)
pH: 6 (ref 5.0–8.0)

## 2014-02-14 LAB — HEPATIC FUNCTION PANEL
ALK PHOS: 89 U/L (ref 39–117)
ALT: 14 U/L (ref 0–35)
AST: 17 U/L (ref 0–37)
Albumin: 3.6 g/dL (ref 3.5–5.2)
Bilirubin, Direct: 0 mg/dL (ref 0.0–0.3)
TOTAL PROTEIN: 7.5 g/dL (ref 6.0–8.3)
Total Bilirubin: 0.2 mg/dL (ref 0.2–1.2)

## 2014-02-14 LAB — TSH: TSH: 2.65 u[IU]/mL (ref 0.35–4.50)

## 2014-02-14 LAB — LDL CHOLESTEROL, DIRECT: Direct LDL: 253.7 mg/dL

## 2014-02-14 MED ORDER — OMEPRAZOLE 20 MG PO CPDR: 20.0000 mg | DELAYED_RELEASE_CAPSULE | Freq: Two times a day (BID) | ORAL | Status: DC | PRN

## 2014-02-14 MED ORDER — FLUOXETINE HCL 40 MG PO CAPS: 40.0000 mg | ORAL_CAPSULE | Freq: Every day | ORAL | Status: DC

## 2014-02-14 MED ORDER — CLONAZEPAM 1 MG PO TABS: ORAL_TABLET | ORAL | Status: DC

## 2014-02-14 MED ORDER — VALACYCLOVIR HCL 1 G PO TABS: 1000.0000 mg | ORAL_TABLET | Freq: Two times a day (BID) | ORAL | Status: DC

## 2014-02-14 NOTE — Patient Instructions (Addendum)
You had the flu shot at work  Please stop smoking  Please continue all other medications as before, and refills have been done if requested.  Please have the pharmacy call with any other refills you may need.  Please continue your efforts at being more active, low cholesterol diet, and weight control.  You are otherwise up to date with prevention measures today.  Please keep your appointments with your specialists as you may have planned  You will be contacted regarding the referral for: colonoscopy  Please go to the XRAY Department in the Basement (go straight as you get off the elevator) for the x-ray testing  Please go to the LAB in the Basement (turn left off the elevator) for the tests to be done today You will be contacted by phone if any changes need to be made immediately.  Otherwise, you will receive a letter about your results with an explanation, but please check with MyChart first.  Please remember to sign up for MyChart if you have not done so, as this will be important to you in the future with finding out test results, communicating by private email, and scheduling acute appointments online when needed.  Please return in 1 year for your yearly visit, or sooner if needed, with Lab testing done 3-5 days before

## 2014-02-14 NOTE — Assessment & Plan Note (Signed)
By hx and exam likely allergy related, but also long term smoker - for cxr

## 2014-02-14 NOTE — Progress Notes (Signed)
Subjective:    Patient ID: [name], female    DOB: 1950/09/28, 63 y.o.   MRN: [id number]  HPI  Here for wellness and f/u;  Overall doing ok;  Pt denies CP, worsening SOB, DOE, wheezing, orthopnea, PND, worsening LE edema, palpitations, dizziness or syncope.  Pt denies neurological change such as new headache, facial or extremity weakness.  Pt denies polydipsia, polyuria, or low sugar symptoms. Pt states overall good compliance with treatment and medications, good tolerability, and has been trying to follow lower cholesterol diet.  Pt denies worsening depressive symptoms, suicidal ideation or panic. No fever, night sweats, wt loss, loss of appetite, or other constitutional symptoms.  Pt states good ability with ADL's, has low fall risk, home safety reviewed and adequate, no other significant changes in hearing or vision, and only occasionally active with exercise, has a new stat bike at home,plans to use more. Still smoking but only at 2 pack per wk now, due to ongoing stressors. Hard to quit, not yet ready.  Mother died 10-11-12 - pulm fibrosis, now still dealing with estate, much stress, some family not speaking anymore.  Also with ongoing allergy symptoms and ? Worsening prod cough recent, but likely post nasal gtt? Past Medical History  Diagnosis Date  . ANXIETY 12/01/2006  . Blepharospasm 12/01/2006  . BONE SPUR 12/01/2006  . Cellulitis and abscess of oral soft tissues 05/01/2008  . Cervicalgia 08/24/2008  . COLD SORE 05/30/2008  . DEPRESSION 12/01/2006  . EXCISION OF GANGLION CYST, WRIST, HX OF 12/01/2006  . GERD 12/01/2006  . HYPERLIPIDEMIA 12/01/2006  . HYPERTENSION, BORDERLINE 12/01/2006  . LOW BACK PAIN 12/01/2006  . MACULAR DEGENERATION 12/01/2006  . OTITIS MEDIA, ACUTE, LEFT 05/30/2008  . PARESTHESIA 08/24/2008  . SINUSITIS- ACUTE-NOS 01/07/2010   Past Surgical History  Procedure Laterality Date  . Abdominal hysterectomy    . Breast lumpectomy      right  . Ganglion cyst excision    . Bone spur       removal right foot  . Blepharospasm    . H/o macular degeneration      reports that she has been smoking.  She does not have any smokeless tobacco history on file. She reports that she does not drink alcohol or use illicit drugs. family history includes Diabetes in her daughter; Heart disease in her father; Hypertension in her mother. Allergies  Allergen Reactions  . Codeine   . Hydrocodone-Acetaminophen   . Loratadine-Pseudoephedrine Er    Current Outpatient Prescriptions on File Prior to Visit  Medication Sig Dispense Refill  . aspirin 81 MG tablet Take 81 mg by mouth daily.         No current facility-administered medications on file prior to visit.     Review of Systems Constitutional: Negative for increased diaphoresis, other activity, appetite or other siginficant weight change  HENT: Negative for worsening hearing loss, ear pain, facial swelling, mouth sores and neck stiffness.   Eyes: Negative for other worsening pain, redness or visual disturbance.  Respiratory: Negative for shortness of breath and wheezing.   Cardiovascular: Negative for chest pain and palpitations.  Gastrointestinal: Negative for diarrhea, blood in stool, abdominal distention or other pain Genitourinary: Negative for hematuria, flank pain or change in urine volume.  Musculoskeletal: Negative for myalgias or other joint complaints.  Skin: Negative for color change and wound.  Neurological: Negative for syncope and numbness. other than noted Hematological: Negative for adenopathy. or other swelling Psychiatric/Behavioral: Negative for hallucinations, self-injury,  decreased concentration or other worsening agitation.      Objective:   Physical Exam BP 132/70  Pulse 71  Temp(Src) 98.3 F (36.8 C) (Oral)  Ht 5\' 3"  (1.6 m)  Wt 142 lb 6 oz (64.581 kg)  BMI 25.23 kg/m2  SpO2 97% VS noted,  Constitutional: Pt is oriented to person, place, and time. Appears well-developed and well-nourished.  Head:  Normocephalic and atraumatic.  Right Ear: External ear normal.  Left Ear: External ear normal.  Nose: Nose normal.  Mouth/Throat: Oropharynx is clear and moist.  Eyes: Conjunctivae and EOM are normal. Pupils are equal, round, and reactive to light.  Neck: Normal range of motion. Neck supple. No JVD present. No tracheal deviation present.  Cardiovascular: Normal rate, regular rhythm, normal heart sounds and intact distal pulses.   Pulmonary/Chest: Effort normal and breath sounds without rales or wheezing  Abdominal: Soft. Bowel sounds are normal. NT. No HSM  Musculoskeletal: Normal range of motion. Exhibits no edema.  Lymphadenopathy:  Has no cervical adenopathy.  Neurological: Pt is alert and oriented to person, place, and time. Pt has normal reflexes. No cranial nerve deficit. Motor grossly intact Skin: Skin is warm and dry. No rash noted.  Psychiatric:  Has normal mood and affect. Behavior is normal.     Assessment & Plan:

## 2014-02-14 NOTE — Assessment & Plan Note (Signed)

## 2014-02-14 NOTE — Progress Notes (Signed)
Pre visit review using our clinic review tool, if applicable. No additional management support is needed unless otherwise documented below in the visit note. 

## 2014-03-21 ENCOUNTER — Encounter: Payer: Self-pay | Admitting: Internal Medicine

## 2014-05-25 ENCOUNTER — Telehealth: Payer: Self-pay | Admitting: *Deleted

## 2014-05-25 NOTE — Telephone Encounter (Signed)
Patient phoned earlier today stating that she would like to be on cancellation list for  Dr. Mare Ferrari  Patient has consult 06/21/14 but c/o palpitations and that is why she is coming in.  Did offer to try to get a sooner appointment, declined stating that she wanted to see  Dr. Mare Ferrari

## 2014-06-21 ENCOUNTER — Ambulatory Visit: Payer: Self-pay | Admitting: Cardiology

## 2014-07-27 ENCOUNTER — Encounter: Payer: Self-pay | Admitting: Internal Medicine

## 2014-07-27 ENCOUNTER — Ambulatory Visit (INDEPENDENT_AMBULATORY_CARE_PROVIDER_SITE_OTHER): Payer: 59 | Admitting: Internal Medicine

## 2014-07-27 VITALS — BP 122/70 | HR 73 | Temp 99.0°F | Resp 18 | Ht 63.0 in | Wt 138.0 lb

## 2014-07-27 DIAGNOSIS — R062 Wheezing: Secondary | ICD-10-CM

## 2014-07-27 DIAGNOSIS — E785 Hyperlipidemia, unspecified: Secondary | ICD-10-CM | POA: Diagnosis not present

## 2014-07-27 DIAGNOSIS — R05 Cough: Secondary | ICD-10-CM

## 2014-07-27 DIAGNOSIS — R059 Cough, unspecified: Secondary | ICD-10-CM

## 2014-07-27 MED ORDER — PREDNISONE 10 MG PO TABS: ORAL_TABLET | ORAL | Status: DC

## 2014-07-27 MED ORDER — HYDROCODONE-HOMATROPINE 5-1.5 MG/5ML PO SYRP: 5.0000 mL | ORAL_SOLUTION | Freq: Four times a day (QID) | ORAL | Status: DC | PRN

## 2014-07-27 MED ORDER — AZITHROMYCIN 250 MG PO TABS: ORAL_TABLET | ORAL | Status: DC

## 2014-07-27 NOTE — Assessment & Plan Note (Signed)
Mild, for predpac asd,  to f/u any worsening symptoms or concerns 

## 2014-07-27 NOTE — Progress Notes (Signed)
Subjective:    Patient ID: [name], female    DOB: 12-02-1950, 64 y.o.   MRN: [id number]  HPI  Here with 4 days onset - Here with acute onset mild to mod 2-3 days ST, HA, general weakness and malaise, with prod cough greenish sputum, but Pt denies chest pain, increased sob or doe, wheezing, orthopnea, PND, increased LE swelling, palpitations, dizziness or syncope  Stopped smoking last wk. Has ongoing chronic cough x 6 mo,   Last cxr neg for acute oct 2015 Past Medical History  Diagnosis Date  . ANXIETY 12/01/2006  . Blepharospasm 12/01/2006  . BONE SPUR 12/01/2006  . Cellulitis and abscess of oral soft tissues 05/01/2008  . Cervicalgia 08/24/2008  . COLD SORE 05/30/2008  . DEPRESSION 12/01/2006  . EXCISION OF GANGLION CYST, WRIST, HX OF 12/01/2006  . GERD 12/01/2006  . HYPERLIPIDEMIA 12/01/2006  . HYPERTENSION, BORDERLINE 12/01/2006  . LOW BACK PAIN 12/01/2006  . MACULAR DEGENERATION 12/01/2006  . OTITIS MEDIA, ACUTE, LEFT 05/30/2008  . PARESTHESIA 08/24/2008  . SINUSITIS- ACUTE-NOS 01/07/2010   Past Surgical History  Procedure Laterality Date  . Abdominal hysterectomy    . Breast lumpectomy      right  . Ganglion cyst excision    . Bone spur      removal right foot  . Blepharospasm    . H/o macular degeneration      reports that she has been smoking.  She does not have any smokeless tobacco history on file. She reports that she does not drink alcohol or use illicit drugs. family history includes Diabetes in her daughter; Heart disease in her father; Hypertension in her mother. Allergies  Allergen Reactions  . Codeine   . Hydrocodone-Acetaminophen   . Loratadine-Pseudoephedrine Er    Current Outpatient Prescriptions on File Prior to Visit  Medication Sig Dispense Refill  . aspirin 81 MG tablet Take 81 mg by mouth daily.      . clonazePAM (KLONOPIN) 1 MG tablet TAKE ONE-HALF TO ONE TABLET BY MOUTH TWICE DAILY AS NEEDED 60 tablet 5  . FLUoxetine (PROZAC) 40 MG capsule Take 1 capsule (40 mg  total) by mouth daily. 90 capsule 3  . omeprazole (PRILOSEC) 20 MG capsule Take 1 capsule (20 mg total) by mouth 2 (two) times daily as needed. 180 capsule 3  . valACYclovir (VALTREX) 1000 MG tablet Take 1 tablet (1,000 mg total) by mouth 2 (two) times daily. 60 tablet 11   No current facility-administered medications on file prior to visit.    Review of Systems  Constitutional: Negative for unusual diaphoresis or night sweats HENT: Negative for ringing in ear or discharge Eyes: Negative for double vision or worsening visual disturbance.  Respiratory: Negative for choking and stridor.   Gastrointestinal: Negative for vomiting or other signifcant bowel change Genitourinary: Negative for hematuria or change in urine volume.  Musculoskeletal: Negative for other MSK pain or swelling Skin: Negative for color change and worsening wound.  Neurological: Negative for tremors and numbness other than noted  Psychiatric/Behavioral: Negative for decreased concentration or agitation other than above       Objective:   Physical Exam BP 122/70 mmHg  Pulse 73  Temp(Src) 99 F (37.2 C) (Oral)  Resp 18  Ht 5\' 3"  (1.6 m)  Wt 138 lb 0.6 oz (62.615 kg)  BMI 24.46 kg/m2  SpO2 99% VS noted, mild ill Constitutional: Pt appears in no significant distress HENT: Head: NCAT.  Right Ear: External ear normal.  Left  Ear: External ear normal.  Eyes: . Pupils are equal, round, and reactive to light. Conjunctivae and EOM are normal Neck: Normal range of motion. Neck supple.  Cardiovascular: Normal rate and regular rhythm.   Pulmonary/Chest: Effort normal and breath sounds decreased bilat without rales but with mild wheezing.  Neurological: Pt is alert. Not confused , motor grossly intact Skin: Skin is warm. No rash, no LE edema Psychiatric: Pt behavior is normal. No agitation.         Assessment & Plan:

## 2014-07-27 NOTE — Progress Notes (Signed)
Pre visit review using our clinic review tool, if applicable. No additional management support is needed unless otherwise documented below in the visit note. 

## 2014-07-27 NOTE — Patient Instructions (Signed)
Please take all new medication as prescribed - the antibiotic, cough med, and prednisone  Please continue all other medications as before, and refills have been done if requested.  Please have the pharmacy call with any other refills you may need.  Please continue your efforts at being more active, low cholesterol diet, and weight control.  Please keep your appointments with your specialists as you may have planned  You are given the work note today also  Please do not re-start smoking.

## 2014-07-27 NOTE — Assessment & Plan Note (Signed)
C/w prob bronchitis - Mild to mod, for antibx course, cough med prn,  to f/u any worsening symptoms or concerns

## 2014-07-27 NOTE — Assessment & Plan Note (Signed)
D/w pt again, declines further statin at this time Lab Results  Component Value Date   CHOL 383* 02/14/2014   HDL 41.80 02/14/2014   LDLDIRECT 253.7 02/14/2014   TRIG 719.0* 02/14/2014   CHOLHDL 9 02/14/2014

## 2014-08-30 ENCOUNTER — Encounter: Payer: Self-pay | Admitting: Cardiology

## 2014-08-30 ENCOUNTER — Ambulatory Visit (INDEPENDENT_AMBULATORY_CARE_PROVIDER_SITE_OTHER): Payer: 59 | Admitting: Cardiology

## 2014-08-30 VITALS — BP 162/80 | HR 61 | Ht 63.0 in | Wt 138.8 lb

## 2014-08-30 DIAGNOSIS — E7800 Pure hypercholesterolemia, unspecified: Secondary | ICD-10-CM

## 2014-08-30 DIAGNOSIS — I10 Essential (primary) hypertension: Secondary | ICD-10-CM | POA: Diagnosis not present

## 2014-08-30 DIAGNOSIS — R0602 Shortness of breath: Secondary | ICD-10-CM | POA: Diagnosis not present

## 2014-08-30 LAB — BASIC METABOLIC PANEL
BUN: 10 mg/dL (ref 6–23)
CALCIUM: 9.5 mg/dL (ref 8.4–10.5)
CO2: 28 mEq/L (ref 19–32)
CREATININE: 0.89 mg/dL (ref 0.40–1.20)
Chloride: 102 mEq/L (ref 96–112)
GFR: 68.31 mL/min (ref >60.00)
Glucose, Bld: 86 mg/dL (ref 70–99)
Potassium: 4.1 mEq/L (ref 3.5–5.1)
Sodium: 136 mEq/L (ref 135–145)

## 2014-08-30 LAB — LIPID PANEL
CHOL/HDL RATIO: 9
Cholesterol: 367 mg/dL — ABNORMAL HIGH (ref 0–200)
HDL: 42.7 mg/dL (ref >39.00)
NonHDL: 324.3
TRIGLYCERIDES: 386 mg/dL — AB (ref 0.0–149.0)
VLDL: 77.2 mg/dL — AB (ref 0.0–40.0)

## 2014-08-30 LAB — HEPATIC FUNCTION PANEL
ALT: 11 U/L (ref 0–35)
AST: 16 U/L (ref 0–37)
Albumin: 3.9 g/dL (ref 3.5–5.2)
Alkaline Phosphatase: 104 U/L (ref 39–117)
BILIRUBIN DIRECT: 0 mg/dL (ref 0.0–0.3)
BILIRUBIN TOTAL: 0.3 mg/dL (ref 0.2–1.2)
TOTAL PROTEIN: 7.4 g/dL (ref 6.0–8.3)

## 2014-08-30 LAB — LDL CHOLESTEROL, DIRECT: Direct LDL: 219 mg/dL

## 2014-08-30 MED ORDER — EZETIMIBE 10 MG PO TABS: 10.0000 mg | ORAL_TABLET | Freq: Every day | ORAL | Status: DC

## 2014-08-30 NOTE — Progress Notes (Signed)
Cardiology Office Note   Date:  08/30/2014   ID:  [name], DOB Sep 05, 1950, MRN [id number]  PCP:  Cathlean Cower, MD  Cardiologist: Darlin Coco MD  No chief complaint on file.     History of Present Illness: [name] is a 64 y.o. female who presents for new patient evaluation.  She is a medical patient of Dr. Cathlean Cower. The patient is concerned about possible ischemic heart disease.  She has a very strong family history of heart disease.  Both her mother and her father heart deceased from heart disease and both had high cholesterols.  Her mother was Purcell Mouton, our patient who died about 2 years ago. The patient has been experiencing a sensation of fluttering in her chest.  She has not had any chest pain.  She has had shortness of breath with exertion area she has had labile hypertension.  She is still smokes cigarettes against advice.  She has a history of marked elevation of cholesterol.  She has had trials of Lipitor Crestor and Zocor all of which caused intractable muscle aches.  She has never tried ezetimibe. Her blood pressure today was elevated.    She attributes today's high blood pressure to whitecoat syndrome.  Typically her blood pressure is normal and she is not on any blood pressure medications.  She has not been aware of any sustained tachycardia.  She has had no dizziness or syncope.    Past Medical History  Diagnosis Date  . ANXIETY 12/01/2006  . Blepharospasm 12/01/2006  . BONE SPUR 12/01/2006  . Cellulitis and abscess of oral soft tissues 05/01/2008  . Cervicalgia 08/24/2008  . COLD SORE 05/30/2008  . DEPRESSION 12/01/2006  . EXCISION OF GANGLION CYST, WRIST, HX OF 12/01/2006  . GERD 12/01/2006  . HYPERLIPIDEMIA 12/01/2006  . HYPERTENSION, BORDERLINE 12/01/2006  . LOW BACK PAIN 12/01/2006  . MACULAR DEGENERATION 12/01/2006  . OTITIS MEDIA, ACUTE, LEFT 05/30/2008  . PARESTHESIA 08/24/2008  . SINUSITIS- ACUTE-NOS 01/07/2010    Past Surgical History  Procedure  Laterality Date  . Abdominal hysterectomy    . Breast lumpectomy      right  . Ganglion cyst excision    . Bone spur      removal right foot  . Blepharospasm    . H/o macular degeneration       Current Outpatient Prescriptions  Medication Sig Dispense Refill  . aspirin 81 MG tablet Take 81 mg by mouth daily.      . clonazePAM (KLONOPIN) 1 MG tablet Take 1 mg by mouth daily as needed for anxiety (for sleep).    Marland Kitchen FLUoxetine (PROZAC) 40 MG capsule Take 1 capsule (40 mg total) by mouth daily. 90 capsule 3  . omeprazole (PRILOSEC) 20 MG capsule Take 20 mg by mouth daily.    . valACYclovir (VALTREX) 1000 MG tablet Take 1,000 mg by mouth 2 (two) times daily as needed (for cold sores).    . ezetimibe (ZETIA) 10 MG tablet Take 1 tablet (10 mg total) by mouth daily. 90 tablet 3   No current facility-administered medications for this visit.    Allergies:   Codeine; Hydrocodone-acetaminophen; and Loratadine-pseudoephedrine er    Social History:  The patient  reports that she has been smoking.  She does not have any smokeless tobacco history on file. She reports that she does not drink alcohol or use illicit drugs.   Family History:  The patient's family history includes Diabetes in her daughter; Heart  attack in her father; Heart disease in her father; Hypertension in her mother; Pulmonary fibrosis in her mother. the family history is positive for high cholesterol on both her mother and father's side.   ROS:  Please see the history of present illness.   Otherwise, review of systems are positive for none.   All other systems are reviewed and negative.    PHYSICAL EXAM: VS:  BP 162/80 mmHg  Pulse 61  Ht 5\' 3"  (1.6 m)  Wt 138 lb 12.8 oz (62.959 kg)  BMI 24.59 kg/m2 , BMI Body mass index is 24.59 kg/(m^2). GEN: Well nourished, well developed, in no acute distress HEENT: normal Neck: no JVD, carotid bruits, or masses Cardiac: RRR; no murmurs, rubs, or gallops,no edema  Respiratory:  clear  to auscultation bilaterally, normal work of breathing GI: soft, nontender, nondistended, + BS MS: no deformity or atrophy Skin: warm and dry, no rash Neuro:  Strength and sensation are intact Psych: euthymic mood, full affect   EKG:  EKG is ordered today. The ekg ordered today demonstrates normal sinus rhythm.  Nonspecific ST changes.  Abnormal EKG.  No prior tracing for comparison.   Recent Labs: 02/14/2014: Hemoglobin 13.5; Platelets 283.0; TSH 2.65 08/30/2014: ALT 11; BUN 10; Creatinine 0.89; Potassium 4.1; Sodium 136    Lipid Panel    Component Value Date/Time   CHOL 367* 08/30/2014 0933   TRIG 386.0* 08/30/2014 0933   HDL 42.70 08/30/2014 0933   CHOLHDL 9 08/30/2014 0933   VLDL 77.2* 08/30/2014 0933   LDLDIRECT 219.0 08/30/2014 0933      Wt Readings from Last 3 Encounters:  08/30/14 138 lb 12.8 oz (62.959 kg)  07/27/14 138 lb 0.6 oz (62.615 kg)  02/14/14 142 lb 6 oz (64.581 kg)        ASSESSMENT AND PLAN:  1.  Severe hypercholesterolemia.  Intolerant of statins. 2.  Dyspnea on exertion 3.  Labile hypertension.  Possible white coat syndrome. 4.  Tobacco abuse 5.  Abnormal EKG with nonspecific ST-T wave changes  Plan: We are going to update her lipid panel today.  We will start her on ezetimibe 10 mg daily. We will have her return for a treadmill Myoview stress test to evaluate her exertional dyspnea and occasional heart fluttering. Recheck here in 3 months for follow-up office visit lipid panel hepatic function panel and basal metabolic panel   Current medicines are reviewed at length with the patient today.  The patient does not have concerns regarding medicines.  The following changes have been made:  Start ezetimibe 10 mg daily for high cholesterol.  Labs/ tests ordered today include:   Orders Placed This Encounter  Procedures  . Lipid panel  . Hepatic function panel  . Basic metabolic panel  . Lipid panel  . Basic metabolic panel  . Hepatic  function panel  . LDL cholesterol, direct  . Myocardial Perfusion Imaging  . EKG 12-Lead     Many thanks for the opportunity to see this pleasant woman with you.  We will be in touch regarding the results of her treadmill Myoview stress test.  Signed, Darlin Coco MD 08/30/2014 1:39 PM    [facility] [place], [place], [place]  [address] Phone: [phone]; Fax: [phone]

## 2014-08-30 NOTE — Patient Instructions (Signed)
Medication Instructions:  START ZETIA 10 MG BY MOUTH DAILY   Labwork: LP/BMET/HFP  Testing/Procedures: Your physician has requested that you have en exercise stress myoview. For further information please visit [email or web address]. Please follow instruction sheet, as given.  Follow-Up: Your physician recommends that you schedule a follow-up appointment in: 3 months with fasting labs (LP/BMET/HFP)

## 2014-08-31 ENCOUNTER — Other Ambulatory Visit: Payer: Self-pay | Admitting: Internal Medicine

## 2014-08-31 NOTE — Telephone Encounter (Signed)
Done hardcopy to Cherina  

## 2014-08-31 NOTE — Telephone Encounter (Signed)
Faxed script back to [facility].../lmb 

## 2014-09-11 ENCOUNTER — Encounter (HOSPITAL_COMMUNITY): Payer: 59

## 2014-09-11 ENCOUNTER — Telehealth (HOSPITAL_COMMUNITY): Payer: Self-pay | Admitting: *Deleted

## 2014-09-11 NOTE — Telephone Encounter (Signed)
Patient given detailed instructions per Myocardial Perfusion Study Information Sheet for test on 09/11/14 at 0700. Patient verbalized understanding. Shaila Gilchrest, Ranae Palms

## 2014-09-12 ENCOUNTER — Ambulatory Visit (HOSPITAL_COMMUNITY): Payer: 59 | Attending: Cardiovascular Disease

## 2014-09-12 DIAGNOSIS — I10 Essential (primary) hypertension: Secondary | ICD-10-CM | POA: Diagnosis not present

## 2014-09-12 DIAGNOSIS — E7800 Pure hypercholesterolemia, unspecified: Secondary | ICD-10-CM

## 2014-09-12 DIAGNOSIS — R0602 Shortness of breath: Secondary | ICD-10-CM | POA: Diagnosis not present

## 2014-09-12 LAB — MYOCARDIAL PERFUSION IMAGING
CHL CUP MPHR: 158 {beats}/min
CHL CUP NUCLEAR SDS: 4
CHL CUP STRESS STAGE 1 GRADE: 0 %
CHL CUP STRESS STAGE 2 GRADE: 0 %
CHL CUP STRESS STAGE 2 HR: 71 {beats}/min
CHL CUP STRESS STAGE 2 SPEED: 0 mph
CHL CUP STRESS STAGE 3 HR: 115 {beats}/min
CHL CUP STRESS STAGE 5 GRADE: 14 %
CHL CUP STRESS STAGE 5 HR: 126 {beats}/min
CHL CUP STRESS STAGE 5 SPEED: 3.4 mph
CHL CUP STRESS STAGE 7 DBP: 55 mmHg
CHL CUP STRESS STAGE 7 GRADE: 0 %
CHL CUP STRESS STAGE 8 GRADE: 0 %
CSEPEW: 1 METS
LHR: 0.3
LV dias vol: 59 mL
LVSYSVOL: 12 mL
NUC STRESS EF: 80 %
Peak BP: 180 mmHg
Peak HR: 85 {beats}/min
Percent HR: 81 %
Percent of predicted max HR: 53 %
Rest HR: 61 {beats}/min
SRS: 2
SSS: 6
Stage 1 DBP: 74 mmHg
Stage 1 HR: 71 {beats}/min
Stage 1 SBP: 147 mmHg
Stage 1 Speed: 0 mph
Stage 3 DBP: 79 mmHg
Stage 3 Grade: 10 %
Stage 3 SBP: 123 mmHg
Stage 3 Speed: 1.7 mph
Stage 4 DBP: 70 mmHg
Stage 4 Grade: 12 %
Stage 4 HR: 125 {beats}/min
Stage 4 SBP: 138 mmHg
Stage 4 Speed: 2.5 mph
Stage 6 DBP: 56 mmHg
Stage 6 Grade: 0 %
Stage 6 HR: 85 {beats}/min
Stage 6 SBP: 180 mmHg
Stage 6 Speed: 0 mph
Stage 7 HR: 82 {beats}/min
Stage 7 SBP: 152 mmHg
Stage 7 Speed: 0 mph
Stage 8 Speed: 0 mph
TID: 0.89

## 2014-09-12 MED ORDER — TECHNETIUM TC 99M SESTAMIBI GENERIC - CARDIOLITE
11.0000 | Freq: Once | INTRAVENOUS | Status: AC | PRN
  Administered 2014-09-12: 11 via INTRAVENOUS

## 2014-09-12 MED ORDER — TECHNETIUM TC 99M SESTAMIBI GENERIC - CARDIOLITE
33.0000 | Freq: Once | INTRAVENOUS | Status: AC | PRN
  Administered 2014-09-12: 33 via INTRAVENOUS

## 2014-09-12 MED ORDER — REGADENOSON 0.4 MG/5ML IV SOLN
0.4000 mg | Freq: Once | INTRAVENOUS | Status: AC
  Administered 2014-09-12: 0.4 mg via INTRAVENOUS

## 2014-09-12 MED ORDER — AMINOPHYLLINE 25 MG/ML IV SOLN
75.0000 mg | Freq: Once | INTRAVENOUS | Status: AC
  Administered 2014-09-12: 75 mg via INTRAVENOUS

## 2014-12-20 ENCOUNTER — Other Ambulatory Visit: Payer: 59

## 2014-12-22 ENCOUNTER — Ambulatory Visit: Payer: 59 | Admitting: Cardiology

## 2014-12-26 ENCOUNTER — Encounter: Payer: Self-pay | Admitting: Cardiology

## 2015-01-25 ENCOUNTER — Other Ambulatory Visit (HOSPITAL_BASED_OUTPATIENT_CLINIC_OR_DEPARTMENT_OTHER): Payer: Self-pay | Admitting: Obstetrics & Gynecology

## 2015-01-25 DIAGNOSIS — Z1231 Encounter for screening mammogram for malignant neoplasm of breast: Secondary | ICD-10-CM

## 2015-01-26 ENCOUNTER — Ambulatory Visit (INDEPENDENT_AMBULATORY_CARE_PROVIDER_SITE_OTHER): Payer: 59 | Admitting: Internal Medicine

## 2015-01-26 ENCOUNTER — Encounter: Payer: Self-pay | Admitting: Internal Medicine

## 2015-01-26 VITALS — BP 130/88 | HR 63 | Temp 98.8°F | Ht 63.0 in | Wt 139.0 lb

## 2015-01-26 DIAGNOSIS — H1013 Acute atopic conjunctivitis, bilateral: Secondary | ICD-10-CM | POA: Diagnosis not present

## 2015-01-26 DIAGNOSIS — F32A Depression, unspecified: Secondary | ICD-10-CM

## 2015-01-26 DIAGNOSIS — H1010 Acute atopic conjunctivitis, unspecified eye: Secondary | ICD-10-CM | POA: Insufficient documentation

## 2015-01-26 DIAGNOSIS — Z0000 Encounter for general adult medical examination without abnormal findings: Secondary | ICD-10-CM | POA: Diagnosis not present

## 2015-01-26 DIAGNOSIS — F329 Major depressive disorder, single episode, unspecified: Secondary | ICD-10-CM

## 2015-01-26 MED ORDER — BUPROPION HCL ER (XL) 150 MG PO TB24
150.0000 mg | ORAL_TABLET | Freq: Every day | ORAL | Status: DC
Start: 2015-01-26 — End: 2015-03-01

## 2015-01-26 MED ORDER — CLONAZEPAM 1 MG PO TABS: ORAL_TABLET | ORAL | Status: DC

## 2015-01-26 MED ORDER — FLUOXETINE HCL 40 MG PO CAPS: 40.0000 mg | ORAL_CAPSULE | Freq: Every day | ORAL | Status: DC

## 2015-01-26 NOTE — Patient Instructions (Addendum)
Please take all new medication as prescribed - the wellbutrin   OK to use Zaditor for the eye allergies (OTC)  Please continue all other medications as before, and refills have been done if requested.  Please have the pharmacy call with any other refills you may need.  Please continue your efforts at being more active, low cholesterol diet, and weight control.  You are otherwise up to date with prevention measures today.  You will be contacted regarding the referral for: counseling  Please keep your appointments with your specialists as you may have planned  Please go to the LAB in the Basement (turn left off the elevator) for the tests to be done today  You will be contacted by phone if any changes need to be made immediately.  Otherwise, you will receive a letter about your results with an explanation, but please check with MyChart first.  Please remember to sign up for MyChart if you have not done so, as this will be important to you in the future with finding out test results, communicating by private email, and scheduling acute appointments online when needed.  Please return in 1 year for your yearly visit, or sooner if needed

## 2015-01-26 NOTE — Assessment & Plan Note (Signed)
With recent stressors family, for add wellbutrin xl 150, also refer counseling

## 2015-01-26 NOTE — Assessment & Plan Note (Signed)
Mild to mod, for otc zaditor,  to f/u any worsening symptoms or concerns

## 2015-01-26 NOTE — Progress Notes (Signed)
Pre visit review using our clinic review tool, if applicable. No additional management support is needed unless otherwise documented below in the visit note. 

## 2015-01-26 NOTE — Progress Notes (Signed)
Subjective:    Patient ID: [name], female    DOB: [birth year], 64 y.o.   MRN: [id number]  HPI  /Here for wellness and f/u;  Overall doing ok;  Pt denies Chest pain, worsening SOB, DOE, wheezing, orthopnea, PND, worsening LE edema, palpitations, dizziness or syncope.  Pt denies neurological change such as new headache, facial or extremity weakness.  Pt denies polydipsia, polyuria, or low sugar symptoms. Pt states overall good compliance with treatment and medications, good tolerability, and has been trying to follow appropriate diet.  Pt has had mild worsening depressive symptoms oiwth recent stressors, but no suicidal ideation or panic. No fever, night sweats, wt loss, loss of appetite, or other constitutional symptoms.  Pt states good ability with ADL's, has low fall risk, home safety reviewed and adequate, no other significant changes in hearing or vision, and only occasionally active with exercise. No other complaints except itchy weepy eyes for 2 wks Past Medical History  Diagnosis Date  . ANXIETY 12/01/2006  . Blepharospasm 12/01/2006  . BONE SPUR 12/01/2006  . Cellulitis and abscess of oral soft tissues 05/01/2008  . Cervicalgia 08/24/2008  . COLD SORE 05/30/2008  . DEPRESSION 12/01/2006  . EXCISION OF GANGLION CYST, WRIST, HX OF 12/01/2006  . GERD 12/01/2006  . HYPERLIPIDEMIA 12/01/2006  . HYPERTENSION, BORDERLINE 12/01/2006  . LOW BACK PAIN 12/01/2006  . MACULAR DEGENERATION 12/01/2006  . OTITIS MEDIA, ACUTE, LEFT 05/30/2008  . PARESTHESIA 08/24/2008  . SINUSITIS- ACUTE-NOS 01/07/2010   Past Surgical History  Procedure Laterality Date  . Abdominal hysterectomy    . Breast lumpectomy      right  . Ganglion cyst excision    . Bone spur      removal right foot  . Blepharospasm    . H/o macular degeneration      reports that she has been smoking.  She does not have any smokeless tobacco history on file. She reports that she does not drink alcohol or use illicit drugs. family history includes  Diabetes in her daughter; Heart attack in her father; Heart disease in her father; Hypertension in her mother; Pulmonary fibrosis in her mother. Allergies  Allergen Reactions  . Codeine   . Hydrocodone-Acetaminophen   . Loratadine-Pseudoephedrine Er    Current Outpatient Prescriptions on File Prior to Visit  Medication Sig Dispense Refill  . aspirin 81 MG tablet Take 81 mg by mouth daily.      . clonazePAM (KLONOPIN) 1 MG tablet TAKE ONE-HALF TO ONE TABLET BY MOUTH TWICE DAILY AS NEEDED. 60 tablet 2  . ezetimibe (ZETIA) 10 MG tablet Take 1 tablet (10 mg total) by mouth daily. 90 tablet 3  . omeprazole (PRILOSEC) 20 MG capsule Take 20 mg by mouth daily.    . valACYclovir (VALTREX) 1000 MG tablet Take 1,000 mg by mouth 2 (two) times daily as needed (for cold sores).     No current facility-administered medications on file prior to visit.   Review of Systems Constitutional: Negative for increased diaphoresis, other activity, appetite or siginficant weight change other than noted HENT: Negative for worsening hearing loss, ear pain, facial swelling, mouth sores and neck stiffness.   Eyes: Negative for other worsening pain, redness or visual disturbance.  Respiratory: Negative for shortness of breath and wheezing  Cardiovascular: Negative for chest pain and palpitations.  Gastrointestinal: Negative for diarrhea, blood in stool, abdominal distention or other pain Genitourinary: Negative for hematuria, flank pain or change in urine volume.  Musculoskeletal: Negative for myalgias  or other joint complaints.  Skin: Negative for color change and wound or drainage.  Neurological: Negative for syncope and numbness. other than noted Hematological: Negative for adenopathy. or other swelling Psychiatric/Behavioral: Negative for hallucinations, SI, self-injury, decreased concentration or other worsening agitation.      Objective:   Physical Exam BP 130/88 mmHg  Pulse 63  Temp(Src) 98.8 F (37.1 C)  (Oral)  Ht 5\' 3"  (1.6 m)  Wt 139 lb (63.05 kg)  BMI 24.63 kg/m2  SpO2 96% VS noted,  Constitutional: Pt is oriented to person, place, and time. Appears well-developed and well-nourished, in no significant distress Head: Normocephalic and atraumatic.  Right Ear: External ear normal.  Left Ear: External ear normal.  Nose: Nose normal.  Mouth/Throat: Oropharynx is clear and moist.  Eyes: Conjunctivae without erythema but mild weepiness and EOM are normal. Pupils are equal, round, and reactive to light.  Neck: Normal range of motion. Neck supple. No JVD present. No tracheal deviation present or significant neck LA or mass Cardiovascular: Normal rate, regular rhythm, normal heart sounds and intact distal pulses.   Pulmonary/Chest: Effort normal and breath sounds without rales or wheezing  Abdominal: Soft. Bowel sounds are normal. NT. No HSM  Psychiatric:  Has normal mood and affect. Behavior is normal. 1+ depressed affect Musculoskeletal: Normal range of motion. Exhibits no edema.  Lymphadenopathy:  Has no cervical adenopathy.  Neurological: Pt is alert and oriented to person, place, and time. Pt has normal reflexes. No cranial nerve deficit. Motor grossly intact Skin: Skin is warm and dry. No rash noted.    Assessment & Plan:

## 2015-01-26 NOTE — Assessment & Plan Note (Signed)

## 2015-01-30 ENCOUNTER — Ambulatory Visit (HOSPITAL_BASED_OUTPATIENT_CLINIC_OR_DEPARTMENT_OTHER): Payer: 59

## 2015-02-15 ENCOUNTER — Other Ambulatory Visit: Payer: Self-pay | Admitting: Internal Medicine

## 2015-02-15 NOTE — Telephone Encounter (Signed)
Klonopin rx request is too soon, as she was given total 3 mo at the sept 30 2016 rx

## 2015-02-18 ENCOUNTER — Other Ambulatory Visit: Payer: Self-pay | Admitting: Internal Medicine

## 2015-02-20 NOTE — Telephone Encounter (Signed)
Klonopin too soon as last rx was just don sept 30, 2016

## 2015-02-22 ENCOUNTER — Other Ambulatory Visit: Payer: Self-pay | Admitting: Internal Medicine

## 2015-02-22 NOTE — Telephone Encounter (Signed)
Pt left msg on triage stating she is trying to get her Klonopin refilled. Pharmacy states they never receive script from 01/26/15. Called [facility] spoke with Pharmacist Katrina verified if rx was received on 01/26/15 for Klonopin. Pharmacist stated that they never received. Gave verbal order to Katrina. Called pt inform status on med.....Johny Chess

## 2015-03-01 ENCOUNTER — Other Ambulatory Visit: Payer: Self-pay | Admitting: Internal Medicine

## 2015-03-01 MED ORDER — FLUOXETINE HCL 40 MG PO CAPS: 40.0000 mg | ORAL_CAPSULE | Freq: Every day | ORAL | Status: DC

## 2015-03-01 MED ORDER — BUPROPION HCL ER (XL) 150 MG PO TB24: 150.0000 mg | ORAL_TABLET | Freq: Every day | ORAL | Status: DC

## 2015-03-01 NOTE — Telephone Encounter (Signed)
Pt also called stating she needs the valtrex and her prescriptions for buPROPion (WELLBUTRIN XL) 150 MG 24 hr tablet [[id number]] and FLUoxetine (PROZAC) 40 MG capsule [[id number] Never made it to the pharmacy.

## 2015-04-21 ENCOUNTER — Other Ambulatory Visit: Payer: Self-pay | Admitting: Internal Medicine

## 2015-04-26 ENCOUNTER — Telehealth: Payer: Self-pay | Admitting: *Deleted

## 2015-04-26 NOTE — Telephone Encounter (Signed)
Left msg on triage stating she have a cough wanting to see if md will refill on the Hycodan cough syrup that was rx back in March...[name]

## 2015-04-27 MED ORDER — HYDROCODONE-HOMATROPINE 5-1.5 MG/5ML PO SYRP: 5.0000 mL | ORAL_SOLUTION | Freq: Four times a day (QID) | ORAL | Status: DC | PRN

## 2015-04-27 NOTE — Telephone Encounter (Signed)
Ok this time only, but will need OV if cough remains an issue  Done hardcopy to [facility]

## 2015-04-27 NOTE — Telephone Encounter (Signed)
Called pt no answer LMOM with md response. Rx place in cabinet for pick-up...[name]

## 2015-05-16 ENCOUNTER — Ambulatory Visit (INDEPENDENT_AMBULATORY_CARE_PROVIDER_SITE_OTHER): Payer: 59 | Admitting: Internal Medicine

## 2015-05-16 ENCOUNTER — Encounter: Payer: Self-pay | Admitting: Internal Medicine

## 2015-05-16 VITALS — BP 156/86 | HR 70 | Temp 98.7°F | Ht 63.0 in | Wt 140.0 lb

## 2015-05-16 DIAGNOSIS — D3610 Benign neoplasm of peripheral nerves and autonomic nervous system, unspecified: Secondary | ICD-10-CM

## 2015-05-16 DIAGNOSIS — I10 Essential (primary) hypertension: Secondary | ICD-10-CM

## 2015-05-16 DIAGNOSIS — F329 Major depressive disorder, single episode, unspecified: Secondary | ICD-10-CM | POA: Diagnosis not present

## 2015-05-16 DIAGNOSIS — F32A Depression, unspecified: Secondary | ICD-10-CM

## 2015-05-16 NOTE — Progress Notes (Signed)
Subjective:    Patient ID: [name], female    DOB: 01/14/1951, 65 y.o.   MRN: [id number]   HPI  Here to f/u; overall doing ok,  Pt denies chest pain, increasing sob or doe, wheezing, orthopnea, PND, increased LE swelling, palpitations, dizziness or syncope.  Pt denies new neurological symptoms such as new headache, or facial or extremity weakness or numbness.  Pt denies polydipsia, polyuria, or low sugar episode.   Pt denies new neurological symptoms such as new headache, or facial or extremity weakness or numbness.   Pt states overall good compliance with meds, mostly trying to follow appropriate diet, with wt overall stable,  but little exercise however.  Denies worsening reflux, abd pain, dysphagia, n/v, bowel change or blood.  Needs referral per insurance purpose to Gen Surg[facility] for a recurrence of right upper chest ganglion, last surgury 2001, requests Dr Alphonsa Overall,  BP at home < 140/90 usually.  Denies worsening depressive symptoms, suicidal ideation, or panic Past Medical History  Diagnosis Date  . ANXIETY 12/01/2006  . Blepharospasm 12/01/2006  . BONE SPUR 12/01/2006  . Cellulitis and abscess of oral soft tissues 05/01/2008  . Cervicalgia 08/24/2008  . COLD SORE 05/30/2008  . DEPRESSION 12/01/2006  . EXCISION OF GANGLION CYST, WRIST, HX OF 12/01/2006  . GERD 12/01/2006  . HYPERLIPIDEMIA 12/01/2006  . HYPERTENSION, BORDERLINE 12/01/2006  . LOW BACK PAIN 12/01/2006  . MACULAR DEGENERATION 12/01/2006  . OTITIS MEDIA, ACUTE, LEFT 05/30/2008  . PARESTHESIA 08/24/2008  . SINUSITIS- ACUTE-NOS 01/07/2010   Past Surgical History  Procedure Laterality Date  . Abdominal hysterectomy    . Breast lumpectomy      right  . Ganglion cyst excision    . Bone spur      removal right foot  . Blepharospasm    . H/o macular degeneration      reports that she has been smoking.  She does not have any smokeless tobacco history on file. She reports that she does not drink alcohol or use illicit drugs. family history  includes Diabetes in her daughter; Heart attack in her father; Heart disease in her father; Hypertension in her mother; Pulmonary fibrosis in her mother. Allergies  Allergen Reactions  . Codeine   . Hydrocodone-Acetaminophen   . Loratadine-Pseudoephedrine Er    Current Outpatient Prescriptions on File Prior to Visit  Medication Sig Dispense Refill  . aspirin 81 MG tablet Take 81 mg by mouth daily.      Marland Kitchen buPROPion (WELLBUTRIN XL) 150 MG 24 hr tablet Take 1 tablet (150 mg total) by mouth daily. 90 tablet 3  . clonazePAM (KLONOPIN) 1 MG tablet TAKE ONE-HALF TO ONE TABLET BY MOUTH TWICE DAILY AS NEEDED. 60 tablet 2  . ezetimibe (ZETIA) 10 MG tablet Take 1 tablet (10 mg total) by mouth daily. 90 tablet 3  . FLUoxetine (PROZAC) 40 MG capsule Take 1 capsule (40 mg total) by mouth daily. 90 capsule 3  . omeprazole (PRILOSEC) 20 MG capsule Take 20 mg by mouth daily.    . valACYclovir (VALTREX) 1000 MG tablet Take 1,000 mg by mouth 2 (two) times daily as needed (for cold sores).    Marland Kitchen HYDROcodone-homatropine (HYCODAN) 5-1.5 MG/5ML syrup Take 5 mLs by mouth every 6 (six) hours as needed for cough. (Patient not taking: Reported on 05/16/2015) 180 mL 0   No current facility-administered medications on file prior to visit.   Review of Systems  Constitutional: Negative for unusual diaphoresis or night sweats HENT:  Negative for ringing in ear or discharge Eyes: Negative for double vision or worsening visual disturbance.  Respiratory: Negative for choking and stridor.   Gastrointestinal: Negative for vomiting or other signifcant bowel change Genitourinary: Negative for hematuria or change in urine volume.  Musculoskeletal: Negative for other MSK pain or swelling Skin: Negative for color change and worsening wound.  Neurological: Negative for tremors and numbness other than noted  Psychiatric/Behavioral: Negative for decreased concentration or agitation other than above       Objective:   Physical  Exam BP 156/86 mmHg  Pulse 70  Temp(Src) 98.7 F (37.1 C) (Oral)  Ht 5\' 3"  (1.6 m)  Wt 140 lb (63.504 kg)  BMI 24.81 kg/m2  SpO2 97% VS noted,  Constitutional: Pt appears in no significant distress HENT: Head: NCAT.  Right Ear: External ear normal.  Left Ear: External ear normal.  Eyes: . Pupils are equal, round, and reactive to light. Conjunctivae and EOM are normal Neck: Normal range of motion. Neck supple.  Cardiovascular: Normal rate and regular rhythm.   Pulmonary/Chest: Effort normal and breath sounds without rales or wheezing.  Abd:  Soft, NT, ND, + BS Neurological: Pt is alert. Not confused , motor grossly intact, nondiscrete raised mass approx 2-3 cm nontender right upper chest Skin: Skin is warm. No rash, no LE edema Psychiatric: Pt behavior is normal. No agitation. not depressed affect, mild nervous    Assessment & Plan:

## 2015-05-16 NOTE — Progress Notes (Signed)
Pre visit review using our clinic review tool, if applicable. No additional management support is needed unless otherwise documented below in the visit note. 

## 2015-05-21 NOTE — Assessment & Plan Note (Signed)
stable overall by history and exam, recent data reviewed with pt, and pt to continue medical treatment as before,  to f/u any worsening symptoms or concerns Lab Results  Component Value Date   WBC 11.1* 02/14/2014   HGB 13.5 02/14/2014   HCT 40.8 02/14/2014   PLT 283.0 02/14/2014   GLUCOSE 86 08/30/2014   CHOL 367* 08/30/2014   TRIG 386.0* 08/30/2014   HDL 42.70 08/30/2014   LDLDIRECT 219.0 08/30/2014   ALT 11 08/30/2014   AST 16 08/30/2014   NA 136 08/30/2014   K 4.1 08/30/2014   CL 102 08/30/2014   CREATININE 0.89 08/30/2014   BUN 10 08/30/2014   CO2 28 08/30/2014   TSH 2.65 02/14/2014

## 2015-05-21 NOTE — Assessment & Plan Note (Signed)
[place] for referral to gen surgury as requested, [place] for [employer]

## 2015-05-21 NOTE — Patient Instructions (Addendum)
Please continue all other medications as before, and refills have been done if requested.  Please have the pharmacy call with any other refills you may need.  Please continue your efforts at being more active, low cholesterol diet, and weight control.  Please keep your appointments with your specialists as you may have planned  You will be contacted regarding the referral for: general surgury

## 2015-05-21 NOTE — Assessment & Plan Note (Signed)
Possibly situational today related to frustration over visit needed today for a referral per insurance requirement, o/w stable overall by history and exam, recent data reviewed with pt, and pt to continue medical treatment as before,  to f/u any worsening symptoms or concerns, to cont check BP at home, return for > 140/90 BP Readings from Last 3 Encounters:  05/16/15 156/86  01/26/15 130/88  08/30/14 162/80

## 2015-06-01 ENCOUNTER — Telehealth: Payer: Self-pay | Admitting: Internal Medicine

## 2015-06-01 DIAGNOSIS — D3610 Benign neoplasm of peripheral nerves and autonomic nervous system, unspecified: Secondary | ICD-10-CM

## 2015-06-01 NOTE — Telephone Encounter (Signed)
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

## 2015-08-02 ENCOUNTER — Other Ambulatory Visit: Payer: Self-pay | Admitting: Orthopedic Surgery

## 2015-08-05 ENCOUNTER — Other Ambulatory Visit: Payer: Self-pay | Admitting: Internal Medicine

## 2015-08-07 MED ORDER — CLONAZEPAM 1 MG PO TABS: ORAL_TABLET | ORAL | Status: DC

## 2015-08-07 NOTE — Telephone Encounter (Signed)
Done hardcopy to Corinne  

## 2015-08-07 NOTE — Telephone Encounter (Signed)
Medication has been sent to pharmacy.  °

## 2015-08-16 ENCOUNTER — Telehealth: Payer: Self-pay | Admitting: Internal Medicine

## 2015-08-16 NOTE — Telephone Encounter (Signed)
Pt called about wanting to know what should she do regarding these medications buPROPion (WELLBUTRIN XL) 150 MG 24 hr tablet and FLUoxetine (PROZAC) 40 MG capsule. Pt has stopped taking these medications for almost a month cold [place]. Call pt @ [phone]. Thank you!

## 2015-10-11 ENCOUNTER — Other Ambulatory Visit: Payer: Self-pay | Admitting: Orthopedic Surgery

## 2015-12-12 ENCOUNTER — Other Ambulatory Visit: Payer: Self-pay | Admitting: Internal Medicine

## 2015-12-12 NOTE — Telephone Encounter (Signed)
Medication refill sent to pharmacy  

## 2015-12-12 NOTE — Telephone Encounter (Signed)
Done hardcopy to Corinne  

## 2016-03-06 ENCOUNTER — Other Ambulatory Visit: Payer: Self-pay | Admitting: Internal Medicine

## 2016-03-13 ENCOUNTER — Ambulatory Visit (INDEPENDENT_AMBULATORY_CARE_PROVIDER_SITE_OTHER): Payer: 59 | Admitting: Internal Medicine

## 2016-03-13 ENCOUNTER — Other Ambulatory Visit (INDEPENDENT_AMBULATORY_CARE_PROVIDER_SITE_OTHER): Payer: 59

## 2016-03-13 ENCOUNTER — Encounter: Payer: Self-pay | Admitting: Internal Medicine

## 2016-03-13 VITALS — BP 210/84 | HR 77 | Temp 98.2°F | Resp 16 | Ht 63.0 in | Wt 140.0 lb

## 2016-03-13 DIAGNOSIS — Z0001 Encounter for general adult medical examination with abnormal findings: Secondary | ICD-10-CM

## 2016-03-13 DIAGNOSIS — M25511 Pain in right shoulder: Secondary | ICD-10-CM | POA: Diagnosis not present

## 2016-03-13 DIAGNOSIS — Z1159 Encounter for screening for other viral diseases: Secondary | ICD-10-CM

## 2016-03-13 DIAGNOSIS — R7989 Other specified abnormal findings of blood chemistry: Secondary | ICD-10-CM | POA: Diagnosis not present

## 2016-03-13 LAB — CBC WITH DIFFERENTIAL/PLATELET
BASOS ABS: 0.1 10*3/uL (ref 0.0–0.1)
Basophils Relative: 0.6 % (ref 0.0–3.0)
EOS PCT: 2.2 % (ref 0.0–5.0)
Eosinophils Absolute: 0.2 10*3/uL (ref 0.0–0.7)
HCT: 39.4 % (ref 36.0–46.0)
Hemoglobin: 13.2 g/dL (ref 12.0–15.0)
LYMPHS ABS: 3.7 10*3/uL (ref 0.7–4.0)
Lymphocytes Relative: 34.6 % (ref 12.0–46.0)
MCHC: 33.5 g/dL (ref 30.0–36.0)
MCV: 86.8 fl (ref 78.0–100.0)
MONO ABS: 0.8 10*3/uL (ref 0.1–1.0)
MONOS PCT: 7.8 % (ref 3.0–12.0)
NEUTROS ABS: 5.9 10*3/uL (ref 1.4–7.7)
NEUTROS PCT: 54.8 % (ref 43.0–77.0)
PLATELETS: 275 10*3/uL (ref 150.0–400.0)
RBC: 4.54 Mil/uL (ref 3.87–5.11)
RDW: 15.6 % — ABNORMAL HIGH (ref 11.5–15.5)
WBC: 10.7 10*3/uL — ABNORMAL HIGH (ref 4.0–10.5)

## 2016-03-13 LAB — HEPATIC FUNCTION PANEL
ALK PHOS: 89 U/L (ref 39–117)
ALT: 11 U/L (ref 0–35)
AST: 13 U/L (ref 0–37)
Albumin: 4.1 g/dL (ref 3.5–5.2)
BILIRUBIN DIRECT: 0 mg/dL (ref 0.0–0.3)
TOTAL PROTEIN: 6.9 g/dL (ref 6.0–8.3)
Total Bilirubin: 0.3 mg/dL (ref 0.2–1.2)

## 2016-03-13 LAB — LDL CHOLESTEROL, DIRECT: Direct LDL: 236 mg/dL

## 2016-03-13 LAB — URINALYSIS, ROUTINE W REFLEX MICROSCOPIC
Bilirubin Urine: NEGATIVE
Ketones, ur: NEGATIVE
Leukocytes, UA: NEGATIVE
Nitrite: NEGATIVE
PH: 6 (ref 5.0–8.0)
RBC / HPF: NONE SEEN (ref 0–?)
Total Protein, Urine: NEGATIVE
Urine Glucose: NEGATIVE
Urobilinogen, UA: 0.2 (ref 0.0–1.0)
WBC, UA: NONE SEEN (ref 0–?)

## 2016-03-13 LAB — BASIC METABOLIC PANEL
BUN: 12 mg/dL (ref 6–23)
CALCIUM: 9.3 mg/dL (ref 8.4–10.5)
CO2: 29 meq/L (ref 19–32)
CREATININE: 0.99 mg/dL (ref 0.40–1.20)
Chloride: 103 mEq/L (ref 96–112)
GFR: 60.11 mL/min (ref >60.00)
GLUCOSE: 89 mg/dL (ref 70–99)
Potassium: 4.1 mEq/L (ref 3.5–5.1)
Sodium: 139 mEq/L (ref 135–145)

## 2016-03-13 LAB — LIPID PANEL
CHOLESTEROL: 355 mg/dL — AB (ref 0–200)
HDL: 50.8 mg/dL (ref >39.00)
NonHDL: 303.8
TRIGLYCERIDES: 383 mg/dL — AB (ref 0.0–149.0)
Total CHOL/HDL Ratio: 7
VLDL: 76.6 mg/dL — AB (ref 0.0–40.0)

## 2016-03-13 LAB — TSH: TSH: 3.84 u[IU]/mL (ref 0.35–4.50)

## 2016-03-13 MED ORDER — CYCLOBENZAPRINE HCL 5 MG PO TABS: 5.0000 mg | ORAL_TABLET | Freq: Three times a day (TID) | ORAL | 1 refills | Status: DC | PRN

## 2016-03-13 MED ORDER — HYDROCODONE-ACETAMINOPHEN 5-325 MG PO TABS: 1.0000 | ORAL_TABLET | Freq: Four times a day (QID) | ORAL | 0 refills | Status: DC | PRN

## 2016-03-13 MED ORDER — KETOROLAC TROMETHAMINE 60 MG/2ML IM SOLN
60.0000 mg | Freq: Once | INTRAMUSCULAR | Status: AC
  Administered 2016-03-13: 30 mg via INTRAMUSCULAR

## 2016-03-13 MED ORDER — AMLODIPINE BESYLATE 10 MG PO TABS: 10.0000 mg | ORAL_TABLET | Freq: Every day | ORAL | 3 refills | Status: DC

## 2016-03-13 NOTE — Patient Instructions (Addendum)
You had the pain shot today (toradol)  Please take all new medication as prescribed - a small prescription of the hydrocodone, and the flexeril for muscle relaxer, as well as BP medication (amlodipine 10 mg per day)  Please check your blood pressure twice per day, and call in 1 week with results  You will be contacted regarding the referral for: Dr Tamala Julian (but please make an appt at the scheduling desk as you leave)  Please continue all other medications as before, and refills have been done if requested.  Please have the pharmacy call with any other refills you may need.  Please continue your efforts at being more active, low cholesterol diet, and weight control.  You are otherwise up to date with prevention measures today.  Please keep your appointments with your specialists as you may have planned  You will be contacted regarding the referral for: colonoscopy  Please go to the LAB in the Basement (turn left off the elevator) for the tests to be done today  You will be contacted by phone if any changes need to be made immediately.  Otherwise, you will receive a letter about your results with an explanation, but please check with MyChart first.  Please remember to sign up for MyChart if you have not done so, as this will be important to you in the future with finding out test results, communicating by private email, and scheduling acute appointments online when needed.  Please return in 6 months, or sooner if needed

## 2016-03-13 NOTE — Progress Notes (Signed)
Subjective:    Patient ID: [name], female    DOB: 1950/05/18, 65 y.o.   MRN: [id number]  HPI  Here for wellness and f/u;  Overall doing ok;  Pt denies Chest pain, worsening SOB, DOE, wheezing, orthopnea, PND, worsening LE edema, palpitations, dizziness or syncope.  Pt denies neurological change such as new headache, facial or extremity weakness.  Pt denies polydipsia, polyuria, or low sugar symptoms. Pt states overall good compliance with treatment and medications, good tolerability, and has been trying to follow appropriate diet.  Pt denies worsening depressive symptoms, suicidal ideation or panic. No fever, night sweats, wt loss, loss of appetite, or other constitutional symptoms.  Pt states good ability with ADL's, has low fall risk, home safety reviewed and adequate, no other significant changes in hearing or vision, and only occasionally active with exercise.  BP quite high today at intake but BP at home   S/p  Lipoma x 2 and right rot cuff surgury and initially did well, but then somehow may have overdone it, so now with right shoudler pain, feels irritable and tired of hurting.  Had trouble with loopiness and dizzywith percocet this last time , when she has been able to take it ok before.  Denies worsening depressive symptoms, suicidal ideation, or panic; has ongoing anxiety, Wt Readings from Last 3 Encounters:  03/13/16 140 lb (63.5 kg)  05/16/15 140 lb (63.5 kg)  01/26/15 139 lb (63 kg)   Past Medical History:  Diagnosis Date  . ANXIETY 12/01/2006  . Blepharospasm 12/01/2006  . BONE SPUR 12/01/2006  . Cellulitis and abscess of oral soft tissues 05/01/2008  . Cervicalgia 08/24/2008  . COLD SORE 05/30/2008  . DEPRESSION 12/01/2006  . EXCISION OF GANGLION CYST, WRIST, HX OF 12/01/2006  . GERD 12/01/2006  . HYPERLIPIDEMIA 12/01/2006  . HYPERTENSION, BORDERLINE 12/01/2006  . LOW BACK PAIN 12/01/2006  . MACULAR DEGENERATION 12/01/2006  . OTITIS MEDIA, ACUTE, LEFT 05/30/2008  . PARESTHESIA 08/24/2008  .  SINUSITIS- ACUTE-NOS 01/07/2010   Past Surgical History:  Procedure Laterality Date  . ABDOMINAL HYSTERECTOMY    . blepharospasm    . bone spur     removal right foot  . BREAST LUMPECTOMY     right  . GANGLION CYST EXCISION    . H/O Macular degeneration      reports that she has been smoking.  She does not have any smokeless tobacco history on file. She reports that she does not drink alcohol or use drugs. family history includes Diabetes in her daughter; Heart attack in her father; Heart disease in her father; Hypertension in her mother; Pulmonary fibrosis in her mother. Allergies  Allergen Reactions  . Codeine   . Hydrocodone-Acetaminophen   . Loratadine-Pseudoephedrine Er    Current Outpatient Prescriptions on File Prior to Visit  Medication Sig Dispense Refill  . aspirin 81 MG tablet Take 81 mg by mouth daily.      Marland Kitchen buPROPion (WELLBUTRIN XL) 150 MG 24 hr tablet TAKE ONE TABLET BY MOUTH ONCE DAILY 90 tablet 3  . clonazePAM (KLONOPIN) 1 MG tablet TAKE ONE-HALF TO ONE TABLET BY MOUTH TWICE DAILY AS NEEDED 60 tablet 2  . ezetimibe (ZETIA) 10 MG tablet Take 1 tablet (10 mg total) by mouth daily. 90 tablet 3  . FLUoxetine (PROZAC) 40 MG capsule Take 1 capsule (40 mg total) by mouth daily. 90 capsule 3  . HYDROcodone-homatropine (HYCODAN) 5-1.5 MG/5ML syrup Take 5 mLs by mouth every 6 (six) hours as  needed for cough. 180 mL 0  . omeprazole (PRILOSEC) 20 MG capsule Take 20 mg by mouth daily.    . valACYclovir (VALTREX) 1000 MG tablet TAKE ONE TABLET BY MOUTH TWICE DAILY 120 tablet 5   No current facility-administered medications on file prior to visit.    Review of Systems Constitutional: Negative for increased diaphoresis, or other activity, appetite or siginficant weight change other than noted HENT: Negative for worsening hearing loss, ear pain, facial swelling, mouth sores and neck stiffness.   Eyes: Negative for other worsening pain, redness or visual disturbance.  Respiratory:  Negative for choking or stridor Cardiovascular: Negative for other chest pain and palpitations.  Gastrointestinal: Negative for worsening diarrhea, blood in stool, or abdominal distention Genitourinary: Negative for hematuria, flank pain or change in urine volume.  Musculoskeletal: Negative for myalgias or other joint complaints.  Skin: Negative for other color change and wound or drainage.  Neurological: Negative for syncope and numbness. other than noted Hematological: Negative for adenopathy. or other swelling Psychiatric/Behavioral: Negative for hallucinations, SI, self-injury, decreased concentration or other worsening agitation.  All other system neg per pt    Objective:   Physical Exam BP (!) 210/84   Pulse 77   Temp 98.2 F (36.8 C) (Oral)   Resp 16   Ht 5\' 3"  (1.6 m)   Wt 140 lb (63.5 kg)   SpO2 96%   BMI 24.80 kg/m  VS noted,  Constitutional: Pt is oriented to person, place, and time. Appears well-developed and well-nourished, in no significant distress Head: Normocephalic and atraumatic  Eyes: Conjunctivae and EOM are normal. Pupils are equal, round, and reactive to light Right Ear: External ear normal.  Left Ear: External ear normal Nose: Nose normal.  Mouth/Throat: Oropharynx is clear and moist  Neck: Normal range of motion. Neck supple. No JVD present. No tracheal deviation present or significant neck LA or mass Cardiovascular: Normal rate, regular rhythm, normal heart sounds and intact distal pulses.   Pulmonary/Chest: Effort normal and breath sounds without rales or wheezing  Abdominal: Soft. Bowel sounds are normal. NT. No HSM  Musculoskeletal: Normal range of motion. Exhibits no edema Lymphadenopathy: Has no cervical adenopathy.  Neurological: Pt is alert and oriented to person, place, and time. Pt has normal reflexes. No cranial nerve deficit. Motor grossly intact Skin: Skin is warm and dry. No rash noted or new ulcers Psychiatric:  Has normal mood and  affect. Behavior is normal.  Right shoudler with reduced ROM, diffuse tender    Assessment & Plan:

## 2016-03-13 NOTE — Progress Notes (Signed)
Pre visit review using our clinic review tool, if applicable. No additional management support is needed unless otherwise documented below in the visit note. 

## 2016-03-14 ENCOUNTER — Other Ambulatory Visit: Payer: Self-pay | Admitting: Internal Medicine

## 2016-03-14 ENCOUNTER — Telehealth: Payer: Self-pay | Admitting: Internal Medicine

## 2016-03-14 ENCOUNTER — Encounter: Payer: Self-pay | Admitting: Internal Medicine

## 2016-03-14 LAB — HEPATITIS C ANTIBODY: HCV Ab: NEGATIVE

## 2016-03-14 MED ORDER — ROSUVASTATIN CALCIUM 40 MG PO TABS: 40.0000 mg | ORAL_TABLET | Freq: Every day | ORAL | 3 refills | Status: DC

## 2016-03-14 NOTE — Telephone Encounter (Signed)
Will you call and schedule new pt appt with Dr. Tamala Julian for neck pain and evaluation per phone note?

## 2016-03-14 NOTE — Telephone Encounter (Signed)
Patient states she seen Dr. Jenny Reichmann yesterday about pain she was having in her neck.  Would like to request a letter from Dr. Jenny Reichmann stating that a sit stand desk would be better for her to have at work with her conditions.

## 2016-03-14 NOTE — Telephone Encounter (Signed)
I would not be able to do this, but I could refer her to Sports medicine (dr Tamala Julian) in this office for neck pain, to also see if this type of recommendation would also be needed

## 2016-03-14 NOTE — Telephone Encounter (Signed)
Okay to schedule with you to evaluate restrictions due to neck pain.

## 2016-03-14 NOTE — Telephone Encounter (Signed)
Sure Next available new patient.

## 2016-03-15 DIAGNOSIS — M25511 Pain in right shoulder: Secondary | ICD-10-CM | POA: Insufficient documentation

## 2016-03-15 NOTE — Assessment & Plan Note (Signed)

## 2016-03-17 NOTE — Telephone Encounter (Signed)
LVM for pt to call back as soon as possible.   RE: schedule with Dr. Tamala Julian to assess the limitations due to neck pain.

## 2016-03-27 ENCOUNTER — Telehealth: Payer: Self-pay | Admitting: Internal Medicine

## 2016-03-27 NOTE — Telephone Encounter (Signed)
Pt called in and said that Dr Jenny Reichmann mention to her there was an injection she could take if she was not able to take the pill form of the stains?  She would like to try it ?

## 2016-03-27 NOTE — Telephone Encounter (Signed)
Unable to reach patient left message to give [place] a call bck

## 2016-03-27 NOTE — Telephone Encounter (Signed)
It is every 2 wks shot and costs $14K per yr, but I can rx it if she wants

## 2016-04-03 ENCOUNTER — Telehealth: Payer: Self-pay | Admitting: *Deleted
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
## 2016-04-03 NOTE — Telephone Encounter (Signed)
This may be due to the high dose  OK to take Half the pill , but if too hard to cut, we can send new rx

## 2016-04-04 NOTE — Telephone Encounter (Signed)
Called pt no answer can't leave msg due to vm being full...[name]

## 2016-04-08 ENCOUNTER — Ambulatory Visit (INDEPENDENT_AMBULATORY_CARE_PROVIDER_SITE_OTHER): Payer: 59 | Admitting: Internal Medicine

## 2016-04-08 ENCOUNTER — Encounter: Payer: Self-pay | Admitting: Internal Medicine

## 2016-04-08 VITALS — BP 140/80 | HR 94 | Temp 98.0°F | Resp 20 | Wt 133.0 lb

## 2016-04-08 DIAGNOSIS — K5909 Other constipation: Secondary | ICD-10-CM | POA: Diagnosis not present

## 2016-04-08 DIAGNOSIS — I959 Hypotension, unspecified: Secondary | ICD-10-CM

## 2016-04-08 DIAGNOSIS — I10 Essential (primary) hypertension: Secondary | ICD-10-CM

## 2016-04-08 DIAGNOSIS — F411 Generalized anxiety disorder: Secondary | ICD-10-CM

## 2016-04-08 MED ORDER — LOSARTAN POTASSIUM 50 MG PO TABS: 50.0000 mg | ORAL_TABLET | Freq: Every day | ORAL | 11 refills | Status: DC

## 2016-04-08 MED ORDER — LOSARTAN POTASSIUM 50 MG PO TABS: 50.0000 mg | ORAL_TABLET | Freq: Every day | ORAL | 3 refills | Status: DC

## 2016-04-08 NOTE — Progress Notes (Signed)
Pre visit review using our clinic review tool, if applicable. No additional management support is needed unless otherwise documented below in the visit note. 

## 2016-04-08 NOTE — Patient Instructions (Addendum)
OK to stop the amlodipine  Please take all new medication as prescribed  - the losartan 50 mg per day  Please take Miralax OTC daily, and check your BP regularly at home  OK to restart all of your other medication, except remember the hydrocodone can make constipation worse  Please continue all other medications as before, and refills have been done if requested.  Please have the pharmacy call with any other refills you may need.  Please continue your efforts at being more active, low cholesterol diet, and weight control.  Please keep your appointments with your specialists as you may have planned  Please return in 3 months, or sooner if needed

## 2016-04-08 NOTE — Progress Notes (Signed)
Subjective:    Patient ID: [name], female    DOB: 06/23/1950, 65 y.o.   MRN: [id number]  HPI  Here to f/u with ? Of med side effect vs other - last seen nov 16 with severe elev BP in the setting of ongoing "neck pain actually referring to right upper chest lipoma/right shoudler surgury/right flank area lipoma during summer months(prob June 2017).   Added amlod 10 mg, BP was overall improving at home with only mild elevation at times, sometimes less than 140.  Has some dizziness during that time but no low BP. Then Dec 7 (last thur)  developed symptoms - sweats, feeling warm, and more Wobbly in the head, dizzy, nauseas , thought originally from the BP med, but now thinks may due to the stomach flu symptoms (gas, belching, stomach knots but some constipation), feels dehydrated, she has stopped the klonopin x 1 wk with HA after, and all meds x 2 days.  Feeling some better in past day, and more hungry today.  Lost 7 lbs.  BP has been lower than usual lately in last few days as well .  Had been taking the hydrocodone was taken irregularly, most ly in the evening.  Has held off on taking this and the the muscle relaxer after dec 7 due to feeling poorly.  Original rx Nov 16 was for#40 pills, and has about 25 left.  Does have untreated recurring constipation for many years Wt Readings from Last 3 Encounters:  04/08/16 133 lb (60.3 kg)  03/13/16 140 lb (63.5 kg)  05/16/15 140 lb (63.5 kg)   BP Readings from Last 3 Encounters:  04/08/16 140/80  03/13/16 (!) 210/84  05/16/15 (!) 156/86  Has  been statin intolerant so not taking the crestor (could not tolerate that or zocor). Denies urinary symptoms such as dysuria, frequency, urgency, flank pain, hematuria or n/v, fever, chills.  Past Medical History:  Diagnosis Date  . ANXIETY 12/01/2006  . Blepharospasm 12/01/2006  . BONE SPUR 12/01/2006  . Cellulitis and abscess of oral soft tissues 05/01/2008  . Cervicalgia 08/24/2008  . COLD SORE 05/30/2008  .  DEPRESSION 12/01/2006  . EXCISION OF GANGLION CYST, WRIST, HX OF 12/01/2006  . GERD 12/01/2006  . HYPERLIPIDEMIA 12/01/2006  . HYPERTENSION, BORDERLINE 12/01/2006  . LOW BACK PAIN 12/01/2006  . MACULAR DEGENERATION 12/01/2006  . OTITIS MEDIA, ACUTE, LEFT 05/30/2008  . PARESTHESIA 08/24/2008  . SINUSITIS- ACUTE-NOS 01/07/2010   Past Surgical History:  Procedure Laterality Date  . ABDOMINAL HYSTERECTOMY    . blepharospasm    . bone spur     removal right foot  . BREAST LUMPECTOMY     right  . GANGLION CYST EXCISION    . H/O Macular degeneration      reports that she has been smoking.  She does not have any smokeless tobacco history on file. She reports that she does not drink alcohol or use drugs. family history includes Diabetes in her daughter; Heart attack in her father; Heart disease in her father; Hypertension in her mother; Pulmonary fibrosis in her mother. Allergies  Allergen Reactions  . Codeine   . Hydrocodone-Acetaminophen   . Loratadine-Pseudoephedrine Er   . Statins Other (See Comments)    Joint and muscle cramp   Current Outpatient Prescriptions on File Prior to Visit  Medication Sig Dispense Refill  . aspirin 81 MG tablet Take 81 mg by mouth daily.      Marland Kitchen buPROPion (WELLBUTRIN XL) 150 MG 24 hr  tablet TAKE ONE TABLET BY MOUTH ONCE DAILY 90 tablet 3  . clonazePAM (KLONOPIN) 1 MG tablet TAKE ONE-HALF TO ONE TABLET BY MOUTH TWICE DAILY AS NEEDED 60 tablet 2  . cyclobenzaprine (FLEXERIL) 5 MG tablet Take 1 tablet (5 mg total) by mouth 3 (three) times daily as needed for muscle spasms. 40 tablet 1  . ezetimibe (ZETIA) 10 MG tablet Take 1 tablet (10 mg total) by mouth daily. 90 tablet 3  . FLUoxetine (PROZAC) 40 MG capsule Take 1 capsule (40 mg total) by mouth daily. 90 capsule 3  . HYDROcodone-acetaminophen (NORCO/VICODIN) 5-325 MG tablet Take 1 tablet by mouth every 6 (six) hours as needed for moderate pain. 40 tablet 0  . omeprazole (PRILOSEC) 20 MG capsule Take 20 mg by mouth daily.     . valACYclovir (VALTREX) 1000 MG tablet TAKE ONE TABLET BY MOUTH TWICE DAILY 120 tablet 5   No current facility-administered medications on file prior to visit.    Review of Systems  Constitutional: Negative for unusual diaphoresis or night sweats HENT: Negative for ear swelling or discharge Eyes: Negative for worsening visual haziness  Respiratory: Negative for choking and stridor.   Gastrointestinal: Negative for distension or worsening eructation Genitourinary: Negative for retention or change in urine volume.  Musculoskeletal: Negative for other MSK pain or swelling Skin: Negative for color change and worsening wound Neurological: Negative for tremors and numbness other than noted  Psychiatric/Behavioral: Negative for decreased concentration or agitation other than above   All other system neg per pt    Objective:   Physical Exam BP 140/80   Pulse 94   Temp 98 F (36.7 C) (Oral)   Resp 20   Wt 133 lb (60.3 kg)   SpO2 98%   BMI 23.56 kg/m  VS noted,  Constitutional: Pt appears in no apparent distress HENT: Head: NCAT.  Right Ear: External ear normal.  Left Ear: External ear normal.  Eyes: . Pupils are equal, round, and reactive to light. Conjunctivae and EOM are normal Neck: Normal range of motion. Neck supple.  Cardiovascular: Normal rate and regular rhythm.   Pulmonary/Chest: Effort normal and breath sounds without rales or wheezing.  Abd:  Soft, NT, ND, + BS Neurological: Pt is alert. Not confused , motor grossly intact Skin: Skin is warm. No rash, no LE edema Psychiatric: Pt behavior is normal. No agitation. 1-2+ nervous No other new exam findings    Assessment & Plan:

## 2016-04-10 ENCOUNTER — Encounter: Payer: Self-pay | Admitting: Internal Medicine

## 2016-04-14 NOTE — Assessment & Plan Note (Signed)
?   IBS related, for miralax daily

## 2016-04-14 NOTE — Assessment & Plan Note (Signed)
stable overall by history and exam, recent data reviewed with pt, and pt for losartan 50 qd,  to f/u any worsening symptoms or concerns. BP Readings from Last 3 Encounters:  04/08/16 140/80  03/13/16 (!) 210/84  05/16/15 (!) 156/86

## 2016-04-14 NOTE — Assessment & Plan Note (Addendum)
Resolved with hydration,  D/c amlodipine per pt reqeust, to f/u any worsening symptoms or concerns  Note:  Total time for pt hx, exam, review of record with pt in the room, determination of diagnoses and plan for further eval and tx is > 40 min, with over 50% spent in coordination and counseling of patient

## 2016-04-14 NOTE — Assessment & Plan Note (Signed)
Mild to mod, for klonopin prn, to f/u any worsening symptoms or concerns 

## 2016-05-22 ENCOUNTER — Other Ambulatory Visit: Payer: Self-pay | Admitting: Internal Medicine

## 2016-05-22 NOTE — Telephone Encounter (Signed)
Done erx 

## 2016-06-20 ENCOUNTER — Other Ambulatory Visit: Payer: Self-pay | Admitting: Internal Medicine

## 2016-06-20 NOTE — Telephone Encounter (Signed)
Done hardcopy to Anna  

## 2016-06-23 NOTE — Telephone Encounter (Signed)
Faxed to [facility]

## 2016-07-30 ENCOUNTER — Ambulatory Visit: Payer: 59 | Admitting: Internal Medicine

## 2016-08-07 ENCOUNTER — Encounter: Payer: Self-pay | Admitting: Internal Medicine

## 2016-08-07 ENCOUNTER — Ambulatory Visit (INDEPENDENT_AMBULATORY_CARE_PROVIDER_SITE_OTHER): Payer: 59 | Admitting: Internal Medicine

## 2016-08-07 VITALS — BP 156/88 | HR 71 | Temp 98.1°F | Ht 62.5 in | Wt 137.0 lb

## 2016-08-07 DIAGNOSIS — M79601 Pain in right arm: Secondary | ICD-10-CM | POA: Insufficient documentation

## 2016-08-07 DIAGNOSIS — I10 Essential (primary) hypertension: Secondary | ICD-10-CM

## 2016-08-07 DIAGNOSIS — F411 Generalized anxiety disorder: Secondary | ICD-10-CM

## 2016-08-07 MED ORDER — DICLOFENAC SODIUM 1 % TD GEL: 4.0000 g | Freq: Four times a day (QID) | TRANSDERMAL | 11 refills | Status: DC | PRN

## 2016-08-07 MED ORDER — METHYLPREDNISOLONE ACETATE 80 MG/ML IJ SUSP
80.0000 mg | Freq: Once | INTRAMUSCULAR | Status: AC
  Administered 2016-08-07: 80 mg via INTRAMUSCULAR

## 2016-08-07 NOTE — Progress Notes (Signed)
Subjective:    Patient ID: [name], female    DOB: 06/14/1950, 66 y.o.   MRN: [id number]  HPI  Here with pain to right mid lateral arm, and points to the deltoid -humerus insertion site, without swelling, redness or trauma, but worse to abduct the arm and no pain radiation.  Has FROM s/p right rotater cuff in the past and seems to have no impingement like pain.  No neck pain although she has had that at times in the past.  Current issue is mild to mod to occas severe, worse at the end of the work day, as she has spent hours working with the mouse on the computer.  She states she has the correct chair and ergonomics, but seems to getting worse.  Worst pain of all is first getting up in the arm when has not used the arm overnight.  Asks for depomedrol as she know this helped before.   Pt denies chest pain, increased sob or doe, wheezing, orthopnea, PND, increased LE swelling, palpitations, dizziness or syncope.  Pt denies new neurological symptoms such as new headache, or facial or extremity weakness or numbness   Pt denies polydipsia, polyuria,  Denies worsening depressive symptoms, suicidal ideation, or panic \ Past Medical History:  Diagnosis Date  . ANXIETY 12/01/2006  . Blepharospasm 12/01/2006  . BONE SPUR 12/01/2006  . Cellulitis and abscess of oral soft tissues 05/01/2008  . Cervicalgia 08/24/2008  . COLD SORE 05/30/2008  . DEPRESSION 12/01/2006  . EXCISION OF GANGLION CYST, WRIST, HX OF 12/01/2006  . GERD 12/01/2006  . HYPERLIPIDEMIA 12/01/2006  . HYPERTENSION, BORDERLINE 12/01/2006  . LOW BACK PAIN 12/01/2006  . MACULAR DEGENERATION 12/01/2006  . OTITIS MEDIA, ACUTE, LEFT 05/30/2008  . PARESTHESIA 08/24/2008  . SINUSITIS- ACUTE-NOS 01/07/2010   Past Surgical History:  Procedure Laterality Date  . ABDOMINAL HYSTERECTOMY    . blepharospasm    . bone spur     removal right foot  . BREAST LUMPECTOMY     right  . GANGLION CYST EXCISION    . H/O Macular degeneration      reports that she has been  smoking.  She has never used smokeless tobacco. She reports that she does not drink alcohol or use drugs. family history includes Diabetes in her daughter; Heart attack in her father; Heart disease in her father; Hypertension in her mother; Pulmonary fibrosis in her mother. Allergies  Allergen Reactions  . Codeine   . Hydrocodone-Acetaminophen   . Loratadine-Pseudoephedrine Er   . Statins Other (See Comments)    Joint and muscle cramp   Current Outpatient Prescriptions on File Prior to Visit  Medication Sig Dispense Refill  . aspirin 81 MG tablet Take 81 mg by mouth daily.      Marland Kitchen buPROPion (WELLBUTRIN XL) 150 MG 24 hr tablet TAKE ONE TABLET BY MOUTH ONCE DAILY 90 tablet 3  . clonazePAM (KLONOPIN) 1 MG tablet TAKE ONE-HALF TO ONE TABLET BY MOUTH TWICE DAILY AS NEEDED 60 tablet 2  . cyclobenzaprine (FLEXERIL) 5 MG tablet Take 1 tablet (5 mg total) by mouth 3 (three) times daily as needed for muscle spasms. 40 tablet 1  . ezetimibe (ZETIA) 10 MG tablet Take 1 tablet (10 mg total) by mouth daily. 90 tablet 3  . FLUoxetine (PROZAC) 40 MG capsule TAKE ONE CAPSULE BY MOUTH ONCE DAILY 90 capsule 3  . HYDROcodone-acetaminophen (NORCO/VICODIN) 5-325 MG tablet Take 1 tablet by mouth every 6 (six) hours as needed for moderate  pain. 40 tablet 0  . losartan (COZAAR) 50 MG tablet Take 1 tablet (50 mg total) by mouth daily. 90 tablet 3  . omeprazole (PRILOSEC) 20 MG capsule TAKE ONE CAPSULE BY MOUTH TWICE DAILY AS NEEDED 180 capsule 2  . valACYclovir (VALTREX) 1000 MG tablet TAKE ONE TABLET BY MOUTH TWICE DAILY 120 tablet 5   No current facility-administered medications on file prior to visit.    Review of Systems   Constitutional: Negative for other unusual diaphoresis or sweats HENT: Negative for ear discharge or swelling Eyes: Negative for other worsening visual disturbances Respiratory: Negative for stridor or other swelling  Gastrointestinal: Negative for worsening distension or other  blood Genitourinary: Negative for retention or other urinary change Musculoskeletal: Negative for other MSK pain or swelling Skin: Negative for color change or other new lesions Neurological: Negative for worsening tremors and other numbness  Psychiatric/Behavioral: Negative for worsening agitation or other fatigue All other system neg per pt    Objective:   Physical Exam VS noted,  Constitutional: Pt appears in NAD HENT: Head: NCAT.  Right Ear: External ear normal.  Left Ear: External ear normal.  Eyes: . Pupils are equal, round, and reactive to light. Conjunctivae and EOM are normal Nose: without d/c or deformity Neck: Neck supple. Gross normal ROM Cardiovascular: Normal rate and regular rhythm.   Pulmonary/Chest: Effort normal and breath sounds without rales or wheezing.  Right arm with mild tender at the deltoid-humerus insertion site without swelling Right shoudler NT with FROM, no swelling Right elbow NT, with FROM, no effusion Neurological: Pt is alert. At baseline orientation, motor grossly intact Skin: Skin is warm. No rashes, other new lesions, no LE edema Psychiatric: Pt behavior is normal without agitation mild nervous only No other exam findings    Assessment & Plan:

## 2016-08-07 NOTE — Progress Notes (Signed)
Pre visit review using our clinic review tool, if applicable. No additional management support is needed unless otherwise documented below in the visit note. 

## 2016-08-07 NOTE — Patient Instructions (Signed)
You had the steroid shot today  Please take all new medication as prescribed - the topical anti-inflammatory  Please continue all other medications as before, and refills have been done if requested.  Please have the pharmacy call with any other refills you may need.  Please continue your efforts at being more active, low cholesterol diet, and weight control.  Please keep your appointments with your specialists as you may have planned

## 2016-08-07 NOTE — Assessment & Plan Note (Signed)
c/w tendonitis at the deltoid/humerus insertion site, exam benign except for tenderness; ok for depomedrol IM 80, and voltaren gel topical prn,  to f/u any worsening symptoms or concerns

## 2016-08-07 NOTE — Assessment & Plan Note (Signed)
Mild elev today liekly reactive to apin, o/2 stable overall by history and exam, recent data reviewed with pt, and pt to continue medical treatment as before,  to f/u any worsening symptoms or concerns BP Readings from Last 3 Encounters:  08/07/16 (!) 156/88  04/08/16 140/80  03/13/16 (!) 210/84

## 2016-08-07 NOTE — Assessment & Plan Note (Signed)
stable overall by history and exam, and pt to continue medical treatment as before,  to f/u any worsening symptoms or concerns 

## 2016-09-17 ENCOUNTER — Telehealth: Payer: Self-pay | Admitting: Internal Medicine

## 2016-09-17 MED ORDER — BUPROPION HCL ER (XL) 300 MG PO TB24: 300.0000 mg | ORAL_TABLET | Freq: Every day | ORAL | 3 refills | Status: DC

## 2016-09-17 NOTE — Telephone Encounter (Signed)
Pt has been informed and expressed understanding.  

## 2016-09-17 NOTE — Telephone Encounter (Signed)
Ok to increase the wellbutrin XL to 300 mg , but coinsider chantix if not helping

## 2016-09-17 NOTE — Telephone Encounter (Signed)
Patient states she is about to have oral surgery and have bone graft for dental implant. She has to quit smoking. She states she take Wallbutrin 150mg . But it is not working. She wants to know if it can be upped or if she can have a different rx. Please advise if patient needs appointment.  Follow up with patient on Work # [phone] Thank you.

## 2016-11-13 ENCOUNTER — Ambulatory Visit (INDEPENDENT_AMBULATORY_CARE_PROVIDER_SITE_OTHER): Payer: 59 | Admitting: Internal Medicine

## 2016-11-13 VITALS — BP 124/86 | HR 66 | Ht 62.5 in | Wt 136.0 lb

## 2016-11-13 DIAGNOSIS — I10 Essential (primary) hypertension: Secondary | ICD-10-CM | POA: Diagnosis not present

## 2016-11-13 DIAGNOSIS — R05 Cough: Secondary | ICD-10-CM | POA: Diagnosis not present

## 2016-11-13 DIAGNOSIS — R059 Cough, unspecified: Secondary | ICD-10-CM

## 2016-11-13 MED ORDER — AZITHROMYCIN 250 MG PO TABS: ORAL_TABLET | ORAL | 1 refills | Status: DC

## 2016-11-13 MED ORDER — BENZONATATE 100 MG PO CAPS: ORAL_CAPSULE | ORAL | 0 refills | Status: DC

## 2016-11-13 MED ORDER — HYDROCODONE-HOMATROPINE 5-1.5 MG/5ML PO SYRP: 5.0000 mL | ORAL_SOLUTION | Freq: Four times a day (QID) | ORAL | 0 refills | Status: DC | PRN

## 2016-11-13 NOTE — Progress Notes (Signed)
Subjective:    Patient ID: [name], female    DOB: 1951/04/22, 66 y.o.   MRN: [id number]  HPI  Here with 7 days acute onset mild to mod 2-3 days ST, HA, general weakness and malaise, with prod cough greenish sputum, but Pt denies chest pain, increased sob or doe, wheezing, orthopnea, PND, increased LE swelling, palpitations, dizziness or syncope  Husband ill with similar last wk Past Medical History:  Diagnosis Date  . ANXIETY 12/01/2006  . Blepharospasm 12/01/2006  . BONE SPUR 12/01/2006  . Cellulitis and abscess of oral soft tissues 05/01/2008  . Cervicalgia 08/24/2008  . COLD SORE 05/30/2008  . DEPRESSION 12/01/2006  . EXCISION OF GANGLION CYST, WRIST, HX OF 12/01/2006  . GERD 12/01/2006  . HYPERLIPIDEMIA 12/01/2006  . HYPERTENSION, BORDERLINE 12/01/2006  . LOW BACK PAIN 12/01/2006  . MACULAR DEGENERATION 12/01/2006  . OTITIS MEDIA, ACUTE, LEFT 05/30/2008  . PARESTHESIA 08/24/2008  . SINUSITIS- ACUTE-NOS 01/07/2010   Past Surgical History:  Procedure Laterality Date  . ABDOMINAL HYSTERECTOMY    . blepharospasm    . bone spur     removal right foot  . BREAST LUMPECTOMY     right  . GANGLION CYST EXCISION    . H/O Macular degeneration      reports that she has been smoking.  She has never used smokeless tobacco. She reports that she does not drink alcohol or use drugs. family history includes Diabetes in her daughter; Heart attack in her father; Heart disease in her father; Hypertension in her mother; Pulmonary fibrosis in her mother. Allergies  Allergen Reactions  . Codeine   . Hydrocodone-Acetaminophen   . Loratadine-Pseudoephedrine Er   . Statins Other (See Comments)    Joint and muscle cramp   Current Outpatient Prescriptions on File Prior to Visit  Medication Sig Dispense Refill  . aspirin 81 MG tablet Take 81 mg by mouth daily.      Marland Kitchen buPROPion (WELLBUTRIN XL) 300 MG 24 hr tablet Take 1 tablet (300 mg total) by mouth daily. 90 tablet 3  . clonazePAM (KLONOPIN) 1 MG tablet TAKE  ONE-HALF TO ONE TABLET BY MOUTH TWICE DAILY AS NEEDED 60 tablet 2  . cyclobenzaprine (FLEXERIL) 5 MG tablet Take 1 tablet (5 mg total) by mouth 3 (three) times daily as needed for muscle spasms. 40 tablet 1  . diclofenac sodium (VOLTAREN) 1 % GEL Apply 4 g topically 4 (four) times daily as needed. 400 g 11  . ezetimibe (ZETIA) 10 MG tablet Take 1 tablet (10 mg total) by mouth daily. 90 tablet 3  . FLUoxetine (PROZAC) 40 MG capsule TAKE ONE CAPSULE BY MOUTH ONCE DAILY 90 capsule 3  . losartan (COZAAR) 50 MG tablet Take 1 tablet (50 mg total) by mouth daily. 90 tablet 3  . omeprazole (PRILOSEC) 20 MG capsule TAKE ONE CAPSULE BY MOUTH TWICE DAILY AS NEEDED 180 capsule 2  . valACYclovir (VALTREX) 1000 MG tablet TAKE ONE TABLET BY MOUTH TWICE DAILY 120 tablet 5   No current facility-administered medications on file prior to visit.    Review of Systems  All other system neg per pt    Objective:   Physical Exam BP 124/86   Pulse 66   Ht 5' 2.5" (1.588 m)   Wt 136 lb (61.7 kg)   SpO2 99%   BMI 24.48 kg/m  VS noted, mild ill appearing Constitutional: Pt appears in NAD HENT: Head: NCAT.  Right Ear: External ear normal.  Left Ear:  External ear normal.  Eyes: . Pupils are equal, round, and reactive to light. Conjunctivae and EOM are normal Nose: without d/c or deformity Neck: Neck supple. Gross normal ROM Cardiovascular: Normal rate and regular rhythm.   Pulmonary/Chest: Effort normal and breath sounds decreased without rales or wheezing.  Neurological: Pt is alert. At baseline orientation, motor grossly intact Skin: Skin is warm. No rashes, other new lesions, no LE edema Psychiatric: Pt behavior is normal without agitation  No other exam findings    Assessment & Plan:

## 2016-11-13 NOTE — Patient Instructions (Signed)
Please take all new medication as prescribed - the antibiotic, and the cough medicine  Please continue all other medications as before, and refills have been done if requested.  Please have the pharmacy call with any other refills you may need.  Please keep your appointments with your specialists as you may have planned

## 2016-11-15 NOTE — Assessment & Plan Note (Signed)
stable overall by history and exam, recent data reviewed with pt, and pt to continue medical treatment as before,  to f/u any worsening symptoms or concerns BP Readings from Last 3 Encounters:  11/13/16 124/86  08/07/16 (!) 156/88  04/08/16 140/80

## 2016-11-15 NOTE — Assessment & Plan Note (Signed)
Mild to mod, c/w bronchitis vs pna, declines cxr, for antibx course, cough med prn,  to f/u any worsening symptoms or concerns 

## 2016-12-17 ENCOUNTER — Ambulatory Visit (INDEPENDENT_AMBULATORY_CARE_PROVIDER_SITE_OTHER): Payer: 59 | Admitting: Internal Medicine

## 2016-12-17 ENCOUNTER — Encounter: Payer: Self-pay | Admitting: Internal Medicine

## 2016-12-17 VITALS — BP 138/84 | HR 69 | Temp 98.0°F | Ht 62.5 in | Wt 136.0 lb

## 2016-12-17 DIAGNOSIS — L03115 Cellulitis of right lower limb: Secondary | ICD-10-CM | POA: Diagnosis not present

## 2016-12-17 DIAGNOSIS — I10 Essential (primary) hypertension: Secondary | ICD-10-CM | POA: Diagnosis not present

## 2016-12-17 MED ORDER — DOXYCYCLINE HYCLATE 100 MG PO TABS: 100.0000 mg | ORAL_TABLET | Freq: Two times a day (BID) | ORAL | 0 refills | Status: DC

## 2016-12-17 NOTE — Progress Notes (Signed)
Subjective:    Patient ID: [name], female    DOB: 1950/12/14, 66 y.o.   MRN: [id number]  HPI  Here with c/o probable insect bite of some kind to medial mid right thigh x 4 days, now worsening red, tender, swelling without fever or drainage. Does have feverish feeling, dull HA, joint and muscle aching as well. Pt denies chest pain, increased sob or doe, wheezing, orthopnea, PND, increased LE swelling, palpitations, dizziness or syncope.   Pt denies polydipsia, polyuria Past Medical History:  Diagnosis Date  . ANXIETY 12/01/2006  . Blepharospasm 12/01/2006  . BONE SPUR 12/01/2006  . Cellulitis and abscess of oral soft tissues 05/01/2008  . Cervicalgia 08/24/2008  . COLD SORE 05/30/2008  . DEPRESSION 12/01/2006  . EXCISION OF GANGLION CYST, WRIST, HX OF 12/01/2006  . GERD 12/01/2006  . HYPERLIPIDEMIA 12/01/2006  . HYPERTENSION, BORDERLINE 12/01/2006  . LOW BACK PAIN 12/01/2006  . MACULAR DEGENERATION 12/01/2006  . OTITIS MEDIA, ACUTE, LEFT 05/30/2008  . PARESTHESIA 08/24/2008  . SINUSITIS- ACUTE-NOS 01/07/2010   Past Surgical History:  Procedure Laterality Date  . ABDOMINAL HYSTERECTOMY    . blepharospasm    . bone spur     removal right foot  . BREAST LUMPECTOMY     right  . GANGLION CYST EXCISION    . H/O Macular degeneration      reports that she has been smoking.  She has never used smokeless tobacco. She reports that she does not drink alcohol or use drugs. family history includes Diabetes in her daughter; Heart attack in her father; Heart disease in her father; Hypertension in her mother; Pulmonary fibrosis in her mother. Allergies  Allergen Reactions  . Codeine   . Hydrocodone-Acetaminophen   . Loratadine-Pseudoephedrine Er   . Statins Other (See Comments)    Joint and muscle cramp   Current Outpatient Prescriptions on File Prior to Visit  Medication Sig Dispense Refill  . aspirin 81 MG tablet Take 81 mg by mouth daily.      Marland Kitchen buPROPion (WELLBUTRIN XL) 300 MG 24 hr tablet Take 1 tablet  (300 mg total) by mouth daily. 90 tablet 3  . clonazePAM (KLONOPIN) 1 MG tablet TAKE ONE-HALF TO ONE TABLET BY MOUTH TWICE DAILY AS NEEDED 60 tablet 2  . cyclobenzaprine (FLEXERIL) 5 MG tablet Take 1 tablet (5 mg total) by mouth 3 (three) times daily as needed for muscle spasms. 40 tablet 1  . diclofenac sodium (VOLTAREN) 1 % GEL Apply 4 g topically 4 (four) times daily as needed. 400 g 11  . ezetimibe (ZETIA) 10 MG tablet Take 1 tablet (10 mg total) by mouth daily. 90 tablet 3  . FLUoxetine (PROZAC) 40 MG capsule TAKE ONE CAPSULE BY MOUTH ONCE DAILY 90 capsule 3  . losartan (COZAAR) 50 MG tablet Take 1 tablet (50 mg total) by mouth daily. 90 tablet 3  . omeprazole (PRILOSEC) 20 MG capsule TAKE ONE CAPSULE BY MOUTH TWICE DAILY AS NEEDED 180 capsule 2  . valACYclovir (VALTREX) 1000 MG tablet TAKE ONE TABLET BY MOUTH TWICE DAILY 120 tablet 5   No current facility-administered medications on file prior to visit.    Review of Systems All other system neg per pt    Objective:   Physical Exam BP 138/84   Pulse 69   Temp 98 F (36.7 C)   Ht 5' 2.5" (1.588 m)   Wt 136 lb (61.7 kg)   SpO2 99%   BMI 24.48 kg/m  VS noted,  Constitutional: Pt appears in NAD HENT: Head: NCAT.  Right Ear: External ear normal.  Left Ear: External ear normal.  Eyes: . Pupils are equal, round, and reactive to light. Conjunctivae and EOM are normal Nose: without d/c or deformity Neck: Neck supple. Gross normal ROM Cardiovascular: Normal rate and regular rhythm.   Pulmonary/Chest: Effort normal and breath sounds without rales or wheezing.  Neurological: Pt is alert. At baseline orientation, motor grossly intact Skin: Skin is warm, no LE edema , and right medial mid thigh with 3 cm area red, tender, swelling without fluctuance, drainage or red streaks Psychiatric: Pt behavior is normal without agitation  No other exam findings Lab Results  Component Value Date   WBC 10.7 (H) 03/13/2016   HGB 13.2 03/13/2016    HCT 39.4 03/13/2016   PLT 275.0 03/13/2016   GLUCOSE 89 03/13/2016   CHOL 355 (H) 03/13/2016   TRIG 383.0 (H) 03/13/2016   HDL 50.80 03/13/2016   LDLDIRECT 236.0 03/13/2016   ALT 11 03/13/2016   AST 13 03/13/2016   NA 139 03/13/2016   K 4.1 03/13/2016   CL 103 03/13/2016   CREATININE 0.99 03/13/2016   BUN 12 03/13/2016   CO2 29 03/13/2016   TSH 3.84 03/13/2016       Assessment & Plan:

## 2016-12-17 NOTE — Patient Instructions (Signed)
Please take all new medication as prescribed - the antibiotic  Please continue all other medications as before, and refills have been done if requested.  Please have the pharmacy call with any other refills you may need.  Please keep your appointments with your specialists as you may have planned   

## 2016-12-17 NOTE — Assessment & Plan Note (Signed)
stable overall by history and exam, recent data reviewed with pt, and pt to continue medical treatment as before,  to f/u any worsening symptoms or concerns BP Readings from Last 3 Encounters:  12/17/16 138/84  11/13/16 124/86  08/07/16 (!) 156/88

## 2016-12-17 NOTE — Assessment & Plan Note (Signed)
Mild to mod, for antibx course,  to f/u any worsening symptoms or concerns 

## 2017-01-06 ENCOUNTER — Other Ambulatory Visit: Payer: Self-pay | Admitting: Internal Medicine

## 2017-01-06 NOTE — Telephone Encounter (Signed)
Done hardcopy to Shirron  

## 2017-01-06 NOTE — Telephone Encounter (Signed)
Faxed

## 2017-01-15 ENCOUNTER — Encounter: Payer: Self-pay | Admitting: Internal Medicine

## 2017-01-15 ENCOUNTER — Ambulatory Visit (INDEPENDENT_AMBULATORY_CARE_PROVIDER_SITE_OTHER): Payer: 59 | Admitting: Internal Medicine

## 2017-01-15 VITALS — BP 134/80 | HR 66 | Temp 98.4°F | Ht 62.5 in | Wt 135.0 lb

## 2017-01-15 DIAGNOSIS — M542 Cervicalgia: Secondary | ICD-10-CM | POA: Diagnosis not present

## 2017-01-15 DIAGNOSIS — M79641 Pain in right hand: Secondary | ICD-10-CM

## 2017-01-15 DIAGNOSIS — I10 Essential (primary) hypertension: Secondary | ICD-10-CM | POA: Diagnosis not present

## 2017-01-15 MED ORDER — CYCLOBENZAPRINE HCL 5 MG PO TABS: 5.0000 mg | ORAL_TABLET | Freq: Three times a day (TID) | ORAL | 1 refills | Status: DC | PRN

## 2017-01-15 MED ORDER — TRAMADOL HCL 50 MG PO TABS: 50.0000 mg | ORAL_TABLET | Freq: Three times a day (TID) | ORAL | 0 refills | Status: DC | PRN

## 2017-01-15 MED ORDER — IBUPROFEN 800 MG PO TABS: 800.0000 mg | ORAL_TABLET | Freq: Three times a day (TID) | ORAL | 1 refills | Status: DC | PRN

## 2017-01-15 NOTE — Progress Notes (Signed)
Subjective:    Patient ID: [name], female    DOB: March 21, 1951, 66 y.o.   MRN: [id number]  HPI  Here with 1-2 wks constant, gradually worsening severe right hand pain mostly at the thumb, as well as new onset right neck, upper back, shoulder pain without trauma, fever and Pt denies new neurological symptoms such as new headache, or facial or extremity weakness or numbness  Works daily sitting at desk using mouse and computer with [employer].  No other recent overuse or trauma.  Swelling if found along the right thumb as well Past Medical History:  Diagnosis Date  . ANXIETY 12/01/2006  . Blepharospasm 12/01/2006  . BONE SPUR 12/01/2006  . Cellulitis and abscess of oral soft tissues 05/01/2008  . Cervicalgia 08/24/2008  . COLD SORE 05/30/2008  . DEPRESSION 12/01/2006  . EXCISION OF GANGLION CYST, WRIST, HX OF 12/01/2006  . GERD 12/01/2006  . HYPERLIPIDEMIA 12/01/2006  . HYPERTENSION, BORDERLINE 12/01/2006  . LOW BACK PAIN 12/01/2006  . MACULAR DEGENERATION 12/01/2006  . OTITIS MEDIA, ACUTE, LEFT 05/30/2008  . PARESTHESIA 08/24/2008  . SINUSITIS- ACUTE-NOS 01/07/2010   Past Surgical History:  Procedure Laterality Date  . ABDOMINAL HYSTERECTOMY    . blepharospasm    . bone spur     removal right foot  . BREAST LUMPECTOMY     right  . GANGLION CYST EXCISION    . H/O Macular degeneration      reports that she has been smoking.  She has never used smokeless tobacco. She reports that she does not drink alcohol or use drugs. family history includes Diabetes in her daughter; Heart attack in her father; Heart disease in her father; Hypertension in her mother; Pulmonary fibrosis in her mother. Allergies  Allergen Reactions  . Codeine   . Hydrocodone-Acetaminophen   . Loratadine-Pseudoephedrine Er   . Statins Other (See Comments)    Joint and muscle cramp   Current Outpatient Prescriptions on File Prior to Visit  Medication Sig Dispense Refill  . aspirin 81 MG tablet Take 81 mg by mouth daily.      Marland Kitchen buPROPion  (WELLBUTRIN XL) 300 MG 24 hr tablet Take 1 tablet (300 mg total) by mouth daily. 90 tablet 3  . clonazePAM (KLONOPIN) 1 MG tablet TAKE 1/2 TO 1 TABLET BY MOUTH TWICE DAILY AS NEEDED 80 tablet 2  . diclofenac sodium (VOLTAREN) 1 % GEL Apply 4 g topically 4 (four) times daily as needed. 400 g 11  . ezetimibe (ZETIA) 10 MG tablet Take 1 tablet (10 mg total) by mouth daily. 90 tablet 3  . FLUoxetine (PROZAC) 40 MG capsule TAKE ONE CAPSULE BY MOUTH ONCE DAILY 90 capsule 3  . losartan (COZAAR) 50 MG tablet Take 1 tablet (50 mg total) by mouth daily. 90 tablet 3  . omeprazole (PRILOSEC) 20 MG capsule TAKE ONE CAPSULE BY MOUTH TWICE DAILY AS NEEDED 180 capsule 2  . valACYclovir (VALTREX) 1000 MG tablet TAKE ONE TABLET BY MOUTH TWICE DAILY 120 tablet 5   No current facility-administered medications on file prior to visit.    Review of Systems  Constitutional: Negative for other unusual diaphoresis or sweats HENT: Negative for ear discharge or swelling Eyes: Negative for other worsening visual disturbances Respiratory: Negative for stridor or other swelling  Gastrointestinal: Negative for worsening distension or other blood Genitourinary: Negative for retention or other urinary change Musculoskeletal: Negative for other MSK pain or swelling Skin: Negative for color change or other new lesions Neurological: Negative for  worsening tremors and other numbness  Psychiatric/Behavioral: Negative for worsening agitation or other fatigue All other system neg per pt    Objective:   Physical Exam BP 134/80   Pulse 66   Temp 98.4 F (36.9 C) (Oral)   Ht 5' 2.5" (1.588 m)   Wt 135 lb (61.2 kg)   SpO2 100%   BMI 24.30 kg/m  VS noted,  Constitutional: Pt appears in NAD HENT: Head: NCAT.  Right Ear: External ear normal.  Left Ear: External ear normal.  Eyes: . Pupils are equal, round, and reactive to light. Conjunctivae and EOM are normal Nose: without d/c or deformity Neck: Neck supple. Gross normal  ROM Cardiovascular: Normal rate and regular rhythm.   Pulmonary/Chest: Effort normal and breath sounds without rales or wheezing.  Cervical spione: nontender in the midline, has some tenderness to right upper back/trapezoid but shoulder nontender Right hand with marked swelling and tenderness, mild erythema along the first dorsal compartment Neurological: Pt is alert. At baseline orientation, motor grossly intact Skin: Skin is warm. No rashes, other new lesions, no LE edema Psychiatric: Pt behavior is normal without agitation  No other exam fidnings    Assessment & Plan:

## 2017-01-15 NOTE — Patient Instructions (Signed)
Please take all new medication as prescribed - the tramadol and/or the ibuprofen for pain, and flexeril for muscle relaxer  Please continue all other medications as before, and refills have been done if requested.  Please have the pharmacy call with any other refills you may need.  Please keep your appointments with your specialists as you may have planned  You will be contacted regarding the referral for: Sports Medicine  You are given the work note

## 2017-01-17 NOTE — Assessment & Plan Note (Signed)
stable overall by history and exam, recent data reviewed with pt, and pt to continue medical treatment as before,  to f/u any worsening symptoms or concerns BP Readings from Last 3 Encounters:  01/15/17 134/80  12/17/16 138/84  11/13/16 124/86

## 2017-01-17 NOTE — Assessment & Plan Note (Signed)
Exam c/w MSK strain, likely again related to overuse, no evidence for radicular pain or weakness, for pain control as above, also flexeril prn,  to f/u any worsening symptoms or concerns

## 2017-01-17 NOTE — Assessment & Plan Note (Signed)
Most c/w dequervains tenosynovitis due to overuse, for tramadol prn, ibuprofen prn, and sport med referral, also work note given

## 2017-01-28 NOTE — Progress Notes (Signed)
[facility] [address] [place], [place] [address] Phone: [phone] Subjective:    I'm seeing this patient by the request  of:  Biagio Borg, MD   CC: Neck and right hand pain  [id number]  [name] is a 66 y.o. female coming in with complaint of neck and right hand pain. She had RTC surgery from Dr. Tamera Punt 2 years ago.Patient also had a ganglion cyst in the mid clavicle region. She has had surgery on her hand for a cyst removal. She feels a tightness and some pain in the right  pec over the scar.States it is more of a nervelike pain. Seems to come and go. When it does occur has to stop doing whatever she is doing but then seems to resolve after minutes.  Her pain in the hand is constant and sharp. She drops a lot of items due to a lack of strength. Her hand pain started 2 weeks ago out of the blue she states. She is going to get a standing desk and a new mouse to see if this will help. Patient states that sometimes with certain movements sharp pain. Denies any numbness. Possible some weakness. Can be very painful at night.      Past Medical History:  Diagnosis Date  . ANXIETY 12/01/2006  . Blepharospasm 12/01/2006  . BONE SPUR 12/01/2006  . Cellulitis and abscess of oral soft tissues 05/01/2008  . Cervicalgia 08/24/2008  . COLD SORE 05/30/2008  . DEPRESSION 12/01/2006  . EXCISION OF GANGLION CYST, WRIST, HX OF 12/01/2006  . GERD 12/01/2006  . HYPERLIPIDEMIA 12/01/2006  . HYPERTENSION, BORDERLINE 12/01/2006  . LOW BACK PAIN 12/01/2006  . MACULAR DEGENERATION 12/01/2006  . OTITIS MEDIA, ACUTE, LEFT 05/30/2008  . PARESTHESIA 08/24/2008  . SINUSITIS- ACUTE-NOS 01/07/2010   Past Surgical History:  Procedure Laterality Date  . ABDOMINAL HYSTERECTOMY    . blepharospasm    . bone spur     removal right foot  . BREAST LUMPECTOMY     right  . GANGLION CYST EXCISION    . H/O Macular degeneration     Social History   Social History  . Marital status: Married    Spouse name: N/A  . Number of children: 1  . Years of education: N/A   Occupational History  .  [employer]   Social History Main Topics  . Smoking status: Current Every Day Smoker  . Smokeless tobacco: Never Used  . Alcohol use No  . Drug use: No  . Sexual activity: Not on file   Other Topics Concern  . Not on file   Social History Narrative  . No narrative on file   Allergies  Allergen Reactions  . Codeine   . Hydrocodone-Acetaminophen   . Loratadine-Pseudoephedrine Er   . Statins Other (See Comments)    Joint and muscle cramp   Family History  Problem Relation Age of Onset  . Hypertension Mother   . Pulmonary fibrosis Mother   . Heart disease Father   . Heart attack Father   . Diabetes Daughter      Past medical history, social, surgical and family history all reviewed in electronic medical record.  No pertanent information unless stated regarding to the chief complaint.   Review of Systems:Review of systems updated and as accurate as of 01/28/17  No headache, visual changes, nausea, vomiting, diarrhea, constipation, dizziness, abdominal pain, skin rash, fevers, chills, night sweats, weight loss, swollen lymph nodes, body  aches, joint swelling, muscle aches, chest pain, shortness of breath, mood changes.   Objective  There were no vitals taken for this visit. Systems examined below as of 01/28/17   General: No apparent distress alert and oriented x3 mood and affect normal, dressed appropriately.  HEENT: Pupils equal, extraocular movements intact  Respiratory: Patient's speak in full sentences and does not appear short of breath  Cardiovascular: No lower extremity edema, non tender, no erythema  Skin: Warm dry intact with no signs of infection or rash on extremities or on axial skeleton.  Abdomen: Soft nontender  Neuro: Cranial nerves II through XII are intact, neurovascularly intact in all extremities with 2+ DTRs and 2+ pulses.  Lymph: No  lymphadenopathy of posterior or anterior cervical chain or axillae bilaterally.  Gait normal with good balance and coordination.  MSK:  Non tender with full range of motion and good stability and symmetric strength and tone of shoulders, elbows,  hip, knee and ankles bilaterally.  Neck: Inspection unremarkable. No palpable stepoffs. Negative Spurling's maneuver. Mild limitation in range of motion with side bending of 5 bilaterally Grip strength and sensation normal in bilateral hands Strength good C4 to T1 distribution No sensory change to C4 to T1 Negative Hoffman sign bilaterally Reflexes normal Patient scar from anterior chest Pundt well-healed. Nontender on exam today. over mid clavicle region.    Wrist: Right Inspection mild atrophy of the thenar eminence ROM smooth and normal with good flexion and extension and ulnar/radial deviation that is symmetrical with opposite wrist. Palpation is normal over metacarpals, navicular, lunate, and TFCC; tendons without tenderness/ swelling No snuffbox tenderness. No tenderness over Canal of Guyon. Strength 5/5 in all directions without pain. Negative Finkelstein, tinel's and phalens. Positive grind test Negative Watson's test.  MSK US performed of: Right thumb This study was ordered, performed, and interpreted by Charlann Boxer D.O.  Wrist: All extensor compartments visualized and tendons all normal in appearance without fraying, tears, or sheath effusions.  patient does have severe narrowing of the [facility] joint  IMPRESSION:  CMC arthritis  Procedure: Real-time Ultrasound Guided Injection of right CMC joint Device: GE Logiq Q7 Ultrasound guided injection is preferred based studies that show increased duration, increased effect, greater accuracy, decreased procedural pain, increased response rate, and decreased cost with ultrasound guided versus blind injection.  Verbal informed consent obtained.  Time-out conducted.  Noted no overlying  erythema, induration, or other signs of local infection.  Skin prepped in a sterile fashion.  Local anesthesia: Topical Ethyl chloride.  With sterile technique and under real time ultrasound guidance:  With a 25-gauge half-inch needle was injected with a total of 0.5 mL of 0.5% Marcaine and 0.5 mL of Kenalog 40 mg/dL Completed without difficulty  Pain immediately resolved suggesting accurate placement of the medication.  Advised to call if fevers/chills, erythema, induration, drainage, or persistent bleeding.  Images permanently stored and available for review in the ultrasound unit.  Impression: Technically successful ultrasound guided injection.   Impression and Recommendations:     This case required medical decision making of moderate complexity.      Note: This dictation was prepared with Dragon dictation along with smaller phrase technology. Any transcriptional errors that result from this process are unintentional.

## 2017-01-29 ENCOUNTER — Ambulatory Visit (INDEPENDENT_AMBULATORY_CARE_PROVIDER_SITE_OTHER): Payer: 59 | Admitting: Family Medicine

## 2017-01-29 ENCOUNTER — Ambulatory Visit: Payer: Self-pay

## 2017-01-29 ENCOUNTER — Encounter: Payer: Self-pay | Admitting: Family Medicine

## 2017-01-29 VITALS — BP 132/90 | HR 80 | Ht 63.0 in | Wt 135.0 lb

## 2017-01-29 DIAGNOSIS — M79642 Pain in left hand: Secondary | ICD-10-CM | POA: Diagnosis not present

## 2017-01-29 DIAGNOSIS — M1811 Unilateral primary osteoarthritis of first carpometacarpal joint, right hand: Secondary | ICD-10-CM | POA: Diagnosis not present

## 2017-01-29 MED ORDER — VITAMIN D (ERGOCALCIFEROL) 1.25 MG (50000 UNIT) PO CAPS: 50000.0000 [IU] | ORAL_CAPSULE | ORAL | 0 refills | Status: DC

## 2017-01-29 MED ORDER — DICLOFENAC SODIUM 2 % TD SOLN: 2.0000 "application " | Freq: Two times a day (BID) | TRANSDERMAL | 3 refills | Status: DC

## 2017-01-29 NOTE — Patient Instructions (Signed)
Good to see you  pennsaid pinkie amount topically 2 times daily as needed.   Ice 20 minutes 2 times daily. Usually after activity and before bed. Wear brace at night CMC arthritis is what you have Start once weekly vitamin D for 12 weeks.  Over the counter  B12 1038mcg daily  B6 200mg  daily  See me again in 4 weeks.

## 2017-01-29 NOTE — Assessment & Plan Note (Signed)
Given an injection. We discussed icing regimen and home exercises. We discussed objective is a doing which ones to avoid. Given brace, once weekly vitamin D  RTC in 3-4 weeks.

## 2017-02-28 NOTE — Progress Notes (Signed)
[name] [address] [place], [place] [address] Phone: [phone] Subjective:    I'm seeing this patient by the request  of:    CC: Hand follow-up  URK:[id number]  [name] is a 66 y.o. female coming in with complaint of right-sided CMC arthritis. Patient was given an injection 1 month ago. Patient was to do conservative therapy including home exercises, icing regimen and bracing. Patient states 95% better.  Continues to wear the brace at night.  Patient states that her daily activities is very easy at this time.  Happy with the results.      Past Medical History:  Diagnosis Date  . ANXIETY 12/01/2006  . Blepharospasm 12/01/2006  . BONE SPUR 12/01/2006  . Cellulitis and abscess of oral soft tissues 05/01/2008  . Cervicalgia 08/24/2008  . COLD SORE 05/30/2008  . DEPRESSION 12/01/2006  . EXCISION OF GANGLION CYST, WRIST, HX OF 12/01/2006  . GERD 12/01/2006  . HYPERLIPIDEMIA 12/01/2006  . HYPERTENSION, BORDERLINE 12/01/2006  . LOW BACK PAIN 12/01/2006  . MACULAR DEGENERATION 12/01/2006  . OTITIS MEDIA, ACUTE, LEFT 05/30/2008  . PARESTHESIA 08/24/2008  . SINUSITIS- ACUTE-NOS 01/07/2010   Past Surgical History:  Procedure Laterality Date  . ABDOMINAL HYSTERECTOMY    . blepharospasm    . bone spur     removal right foot  . BREAST LUMPECTOMY     right  . GANGLION CYST EXCISION    . H/O Macular degeneration     Social History   Socioeconomic History  . Marital status: Married    Spouse name: None  . Number of children: 1  . Years of education: None  . Highest education level: None  Social Needs  . Financial resource strain: None  . Food insecurity - worry: None  . Food insecurity - inability: None  . Transportation needs - medical: None  . Transportation needs - non-medical: None  Occupational History    Employer: [employer]  Tobacco Use  . Smoking status: Current Every Day Smoker  . Smokeless tobacco: Never Used  Substance and Sexual  Activity  . Alcohol use: No  . Drug use: No  . Sexual activity: None  Other Topics Concern  . None  Social History Narrative  . None   Allergies  Allergen Reactions  . Codeine   . Hydrocodone-Acetaminophen   . Loratadine-Pseudoephedrine Er   . Statins Other (See Comments)    Joint and muscle cramp   Family History  Problem Relation Age of Onset  . Hypertension Mother   . Pulmonary fibrosis Mother   . Heart disease Father   . Heart attack Father   . Diabetes Daughter      Past medical history, social, surgical and family history all reviewed in electronic medical record.  No pertanent information unless stated regarding to the chief complaint.   Review of Systems:Review of systems updated and as accurate as of 03/02/17  No headache, visual changes, nausea, vomiting, diarrhea, constipation, dizziness, abdominal pain, skin rash, fevers, chills, night sweats, weight loss, swollen lymph nodes, body aches, joint swelling, muscle aches, chest pain, shortness of breath, mood changes.   Objective  Blood pressure (!) 160/70, pulse 68, height 5\' 3"  (1.6 m), weight 135 lb (61.2 kg), SpO2 98 %. Systems examined below as of 03/02/17   General: No apparent distress alert and oriented x3 mood and affect normal, dressed appropriately.  HEENT: Pupils equal, extraocular movements intact  Respiratory: Patient's speak in full sentences and  does not appear short of breath  Cardiovascular: No lower extremity edema, non tender, no erythema  Skin: Warm dry intact with no signs of infection or rash on extremities or on axial skeleton.  Abdomen: Soft nontender  Neuro: Cranial nerves II through XII are intact, neurovascularly intact in all extremities with 2+ DTRs and 2+ pulses.  Lymph: No lymphadenopathy of posterior or anterior cervical chain or axillae bilaterally.  Gait normal with good balance and coordination.  MSK:  Non tender with full range of motion and good stability and symmetric  strength and tone of shoulders, elbows, wrist, hip, knee and ankles bilaterally.  Mild arthritic changes in multiple joints Right hand exam shows the patient does have arthritic changes of the [facility] with crepitus noted but no pain with grind test today.  Good grip strength.  Neurovascularly intact distally.    Impression and Recommendations:     This case required medical decision making of moderate complexity.      Note: This dictation was prepared with Dragon dictation along with smaller phrase technology. Any transcriptional errors that result from this process are unintentional.

## 2017-03-02 ENCOUNTER — Encounter: Payer: Self-pay | Admitting: Family Medicine

## 2017-03-02 ENCOUNTER — Ambulatory Visit: Payer: 59 | Admitting: Family Medicine

## 2017-03-02 DIAGNOSIS — M1811 Unilateral primary osteoarthritis of first carpometacarpal joint, right hand: Secondary | ICD-10-CM | POA: Diagnosis not present

## 2017-03-02 NOTE — Assessment & Plan Note (Signed)
Much better after the injection.  We discussed icing regimen and home exercises.  We discussed which activities to do well which wants to avoid.  Patient will increase activity slowly over the course the next several days.  Patient will follow up with me as needed

## 2017-04-09 ENCOUNTER — Other Ambulatory Visit (INDEPENDENT_AMBULATORY_CARE_PROVIDER_SITE_OTHER): Payer: 59

## 2017-04-09 ENCOUNTER — Encounter: Payer: Self-pay | Admitting: Internal Medicine

## 2017-04-09 ENCOUNTER — Ambulatory Visit: Payer: 59 | Admitting: Internal Medicine

## 2017-04-09 VITALS — BP 148/82 | HR 90 | Temp 98.2°F | Ht 63.0 in | Wt 132.0 lb

## 2017-04-09 DIAGNOSIS — R197 Diarrhea, unspecified: Secondary | ICD-10-CM | POA: Insufficient documentation

## 2017-04-09 DIAGNOSIS — E7800 Pure hypercholesterolemia, unspecified: Secondary | ICD-10-CM | POA: Diagnosis not present

## 2017-04-09 DIAGNOSIS — I10 Essential (primary) hypertension: Secondary | ICD-10-CM

## 2017-04-09 DIAGNOSIS — Z0001 Encounter for general adult medical examination with abnormal findings: Secondary | ICD-10-CM | POA: Diagnosis not present

## 2017-04-09 DIAGNOSIS — R42 Dizziness and giddiness: Secondary | ICD-10-CM | POA: Insufficient documentation

## 2017-04-09 DIAGNOSIS — E785 Hyperlipidemia, unspecified: Secondary | ICD-10-CM | POA: Diagnosis not present

## 2017-04-09 LAB — CBC WITH DIFFERENTIAL/PLATELET
BASOS ABS: 0.1 10*3/uL (ref 0.0–0.1)
Basophils Relative: 0.9 % (ref 0.0–3.0)
EOS ABS: 0.1 10*3/uL (ref 0.0–0.7)
Eosinophils Relative: 2.1 % (ref 0.0–5.0)
HEMATOCRIT: 40.8 % (ref 36.0–46.0)
HEMOGLOBIN: 13.7 g/dL (ref 12.0–15.0)
LYMPHS PCT: 28.6 % (ref 12.0–46.0)
Lymphs Abs: 1.8 10*3/uL (ref 0.7–4.0)
MCHC: 33.5 g/dL (ref 30.0–36.0)
MCV: 89.2 fl (ref 78.0–100.0)
MONOS PCT: 9.4 % (ref 3.0–12.0)
Monocytes Absolute: 0.6 10*3/uL (ref 0.1–1.0)
Neutro Abs: 3.7 10*3/uL (ref 1.4–7.7)
Neutrophils Relative %: 59 % (ref 43.0–77.0)
Platelets: 239 10*3/uL (ref 150.0–400.0)
RBC: 4.58 Mil/uL (ref 3.87–5.11)
RDW: 14.5 % (ref 11.5–15.5)
WBC: 6.3 10*3/uL (ref 4.0–10.5)

## 2017-04-09 LAB — LIPID PANEL
CHOL/HDL RATIO: 6
CHOLESTEROL: 260 mg/dL — AB (ref 0–200)
HDL: 42.3 mg/dL (ref >39.00)
NonHDL: 217.62
Triglycerides: 233 mg/dL — ABNORMAL HIGH (ref 0.0–149.0)
VLDL: 46.6 mg/dL — ABNORMAL HIGH (ref 0.0–40.0)

## 2017-04-09 LAB — URINALYSIS, ROUTINE W REFLEX MICROSCOPIC
BILIRUBIN URINE: NEGATIVE
HGB URINE DIPSTICK: NEGATIVE
KETONES UR: NEGATIVE
Leukocytes, UA: NEGATIVE
NITRITE: NEGATIVE
PH: 6 (ref 5.0–8.0)
RBC / HPF: NONE SEEN (ref 0–?)
Specific Gravity, Urine: 1.025 (ref 1.000–1.030)
TOTAL PROTEIN, URINE-UPE24: NEGATIVE
URINE GLUCOSE: NEGATIVE
UROBILINOGEN UA: 0.2 (ref 0.0–1.0)

## 2017-04-09 LAB — BASIC METABOLIC PANEL
BUN: 15 mg/dL (ref 6–23)
CALCIUM: 8.9 mg/dL (ref 8.4–10.5)
CHLORIDE: 103 meq/L (ref 96–112)
CO2: 28 mEq/L (ref 19–32)
CREATININE: 1.24 mg/dL — AB (ref 0.40–1.20)
GFR: 46.2 mL/min — AB (ref >60.00)
Glucose, Bld: 85 mg/dL (ref 70–99)
Potassium: 4.3 mEq/L (ref 3.5–5.1)
Sodium: 138 mEq/L (ref 135–145)

## 2017-04-09 LAB — HEPATIC FUNCTION PANEL
ALBUMIN: 4.2 g/dL (ref 3.5–5.2)
ALK PHOS: 93 U/L (ref 39–117)
ALT: 9 U/L (ref 0–35)
AST: 16 U/L (ref 0–37)
BILIRUBIN DIRECT: 0.1 mg/dL (ref 0.0–0.3)
TOTAL PROTEIN: 7.2 g/dL (ref 6.0–8.3)
Total Bilirubin: 0.3 mg/dL (ref 0.2–1.2)

## 2017-04-09 LAB — LDL CHOLESTEROL, DIRECT: Direct LDL: 194 mg/dL

## 2017-04-09 LAB — TSH: TSH: 2.71 u[IU]/mL (ref 0.35–4.50)

## 2017-04-09 MED ORDER — ROSUVASTATIN CALCIUM 40 MG PO TABS: ORAL_TABLET | ORAL | 3 refills | Status: DC

## 2017-04-09 MED ORDER — MECLIZINE HCL 12.5 MG PO TABS: 12.5000 mg | ORAL_TABLET | Freq: Three times a day (TID) | ORAL | 1 refills | Status: AC | PRN

## 2017-04-09 MED ORDER — EZETIMIBE 10 MG PO TABS: 10.0000 mg | ORAL_TABLET | Freq: Every day | ORAL | 3 refills | Status: DC

## 2017-04-09 NOTE — Assessment & Plan Note (Addendum)
Likely viral illness, for labs asd, immodium prn, consider stool studies if worsening, doubt c diff  In addition to the time spent performing CPE, I spent an additional 25 minutes face to face,in which greater than 50% of this time was spent in counseling and coordination of care for patient's acute illness as documented, including the differential dx, tx, further evaluation and other management of diarrhea, vertigo, HTN and HLD

## 2017-04-09 NOTE — Progress Notes (Signed)
Subjective:    Patient ID: [name], female    DOB: 1950-08-15, 66 y.o.   MRN: [id number]  HPI  Here for wellness and f/u;  Overall doing ok;  Pt denies Chest pain, worsening SOB, DOE, wheezing, orthopnea, PND, worsening LE edema, palpitations, dizziness or syncope.  Pt denies neurological change such as new headache, facial or extremity weakness.  Pt denies polydipsia, polyuria, or low sugar symptoms. Pt states overall good compliance with treatment and medications, good tolerability, and has been trying to follow appropriate diet.  Pt denies worsening depressive symptoms, suicidal ideation or panic. No fever, night sweats, wt loss, loss of appetite, or other constitutional symptoms.  Pt states good ability with ADL's, has low fall risk, home safety reviewed and adequate, no other significant changes in hearing or vision, and only occasionally active with exercise.  Plans to make appt with GYN for pap and mammogram.  Not taking statin or zetia, but willing to try again. Father died at 38 with HLD with MI 10 yrs after CABG.  Wt down several lbs with illness, c/o 3 days feeling poorly; Wt Readings from Last 3 Encounters:  04/09/17 132 lb (59.9 kg)  03/02/17 135 lb (61.2 kg)  01/29/17 135 lb (61.2 kg)  Husband drives her to work since mon with intermittent n/v, also some leg cramping in addition to profuse watery diarrhea without abd pain, fever , blood. Also with left ear itching and discomfort, but no d/c, or hearing loss, but with marked intermittent vertigo. Past Medical History:  Diagnosis Date  . ANXIETY 12/01/2006  . Blepharospasm 12/01/2006  . BONE SPUR 12/01/2006  . Cellulitis and abscess of oral soft tissues 05/01/2008  . Cervicalgia 08/24/2008  . COLD SORE 05/30/2008  . DEPRESSION 12/01/2006  . EXCISION OF GANGLION CYST, WRIST, HX OF 12/01/2006  . GERD 12/01/2006  . HYPERLIPIDEMIA 12/01/2006  . HYPERTENSION, BORDERLINE 12/01/2006  . LOW BACK PAIN 12/01/2006  . MACULAR DEGENERATION 12/01/2006  .  OTITIS MEDIA, ACUTE, LEFT 05/30/2008  . PARESTHESIA 08/24/2008  . SINUSITIS- ACUTE-NOS 01/07/2010   Past Surgical History:  Procedure Laterality Date  . ABDOMINAL HYSTERECTOMY    . blepharospasm    . bone spur     removal right foot  . BREAST LUMPECTOMY     right  . GANGLION CYST EXCISION    . H/O Macular degeneration      reports that she has been smoking.  she has never used smokeless tobacco. She reports that she does not drink alcohol or use drugs. family history includes Diabetes in her daughter; Heart attack in her father; Heart disease in her father; Hypertension in her mother; Pulmonary fibrosis in her mother. Allergies  Allergen Reactions  . Codeine   . Hydrocodone-Acetaminophen   . Loratadine-Pseudoephedrine Er   . Statins Other (See Comments)    Joint and muscle cramp   Current Outpatient Medications on File Prior to Visit  Medication Sig Dispense Refill  . aspirin 81 MG tablet Take 81 mg by mouth daily.      Marland Kitchen buPROPion (WELLBUTRIN XL) 300 MG 24 hr tablet Take 1 tablet (300 mg total) by mouth daily. 90 tablet 3  . clonazePAM (KLONOPIN) 1 MG tablet TAKE 1/2 TO 1 TABLET BY MOUTH TWICE DAILY AS NEEDED 80 tablet 2  . cyclobenzaprine (FLEXERIL) 5 MG tablet Take 1 tablet (5 mg total) by mouth 3 (three) times daily as needed for muscle spasms. 40 tablet 1  . Diclofenac Sodium (PENNSAID) 2 % SOLN  Place 2 application onto the skin 2 (two) times daily. 112 g 3  . FLUoxetine (PROZAC) 40 MG capsule TAKE ONE CAPSULE BY MOUTH ONCE DAILY 90 capsule 3  . ibuprofen (ADVIL,MOTRIN) 800 MG tablet Take 1 tablet (800 mg total) by mouth every 8 (eight) hours as needed. 40 tablet 1  . omeprazole (PRILOSEC) 20 MG capsule TAKE ONE CAPSULE BY MOUTH TWICE DAILY AS NEEDED 180 capsule 2  . traMADol (ULTRAM) 50 MG tablet Take 1 tablet (50 mg total) by mouth every 8 (eight) hours as needed. 40 tablet 0  . valACYclovir (VALTREX) 1000 MG tablet TAKE ONE TABLET BY MOUTH TWICE DAILY 120 tablet 5  . Vitamin  D, Ergocalciferol, (DRISDOL) 50000 units CAPS capsule Take 1 capsule (50,000 Units total) by mouth every 7 (seven) days. 12 capsule 0  . losartan (COZAAR) 50 MG tablet Take 1 tablet (50 mg total) by mouth daily. 90 tablet 3   No current facility-administered medications on file prior to visit.    Review of Systems Constitutional: Negative for other unusual diaphoresis, sweats, appetite or weight changes HENT: Negative for other worsening hearing loss, ear pain, facial swelling, mouth sores or neck stiffness.   Eyes: Negative for other worsening pain, redness or other visual disturbance.  Respiratory: Negative for other stridor or swelling Cardiovascular: Negative for other palpitations or other chest pain  Gastrointestinal: Negative for worsening diarrhea or loose stools, blood in stool, distention or other pain Genitourinary: Negative for hematuria, flank pain or other change in urine volume.  Musculoskeletal: Negative for myalgias or other joint swelling.  Skin: Negative for other color change, or other wound or worsening drainage.  Neurological: Negative for other syncope or numbness. Hematological: Negative for other adenopathy or swelling Psychiatric/Behavioral: Negative for hallucinations, other worsening agitation, SI, self-injury, or new decreased concentration All other system neg per pt    Objective:   Physical Exam BP (!) 148/82   Pulse 90   Temp 98.2 F (36.8 C)   Ht 5\' 3"  (1.6 m)   Wt 132 lb (59.9 kg)   SpO2 98%   BMI 23.38 kg/m  VS noted, mild ill Constitutional: Pt is oriented to person, place, and time. Appears well-developed and well-nourished, in no significant distress and comfortable Head: Normocephalic and atraumatic  Eyes: Conjunctivae and EOM are normal. Pupils are equal, round, and reactive to light Right Ear: External ear normal without discharge Left Ear: External ear normal without discharge;  Left TM mild erythema with small post fluid, right TM  clear Max sinus areas non tender.  Pharynx with mild erythema, no exudate Nose: Nose without discharge or deformity Mouth/Throat: Oropharynx is without other ulcerations and moist  Neck: Normal range of motion. Neck supple. No JVD present. No tracheal deviation present or significant neck LA or mass Cardiovascular: Normal rate, regular rhythm, normal heart sounds and intact distal pulses.   Pulmonary/Chest: WOB normal and breath sounds without rales or wheezing  Abdominal: Soft. Bowel sounds are normal. NT except for mild mid epigastric tender without guarding or rebound,  No HSM  Musculoskeletal: Normal range of motion. Exhibits no edema Lymphadenopathy: Has no other cervical adenopathy.  Neurological: Pt is alert and oriented to person, place, and time. Pt has normal reflexes. No cranial nerve deficit. Motor grossly intact, Gait intact Skin: Skin is warm and dry. No rash noted or new ulcerations Psychiatric:  Has normal mood and affect. Behavior is normal without agitation No other exam findings Lab Results  Component Value Date  WBC 6.3 04/09/2017   HGB 13.7 04/09/2017   HCT 40.8 04/09/2017   PLT 239.0 04/09/2017   GLUCOSE 85 04/09/2017   CHOL 260 (H) 04/09/2017   TRIG 233.0 (H) 04/09/2017   HDL 42.30 04/09/2017   LDLDIRECT 194.0 04/09/2017   ALT 9 04/09/2017   AST 16 04/09/2017   NA 138 04/09/2017   K 4.3 04/09/2017   CL 103 04/09/2017   CREATININE 1.24 (H) 04/09/2017   BUN 15 04/09/2017   CO2 28 04/09/2017   TSH 2.71 04/09/2017       Assessment & Plan:

## 2017-04-09 NOTE — Patient Instructions (Addendum)
Shirron should sign you up for the Cologuard  Please take all new medication as prescribed - the crestor twice per wk, and the zetia daily, and the Antivert (meclizine) for dizziness  You will be contacted regarding the referral for: Lipid clinic  Please continue all other medications as before, and refills have been done if requested.  Please have the pharmacy call with any other refills you may need.  Please continue your efforts at being more active, low cholesterol diet, and weight control.  You are otherwise up to date with prevention measures today.  Please keep your appointments with your specialists as you may have planned  Please go to the LAB in the Basement (turn left off the elevator) for the tests to be done today  You will be contacted by phone if any changes need to be made immediately.  Otherwise, you will receive a letter about your results with an explanation, but please check with MyChart first.  Please remember to sign up for MyChart if you have not done so, as this will be important to you in the future with finding out test results, communicating by private email, and scheduling acute appointments online when needed.  Please return in 1 year for your yearly visit, or sooner if needed, with Lab testing done 3-5 days before

## 2017-04-09 NOTE — Assessment & Plan Note (Signed)
stable overall by history and exam, recent data reviewed with pt, and pt to continue medical treatment as before,  to f/u any worsening symptoms or concerns  

## 2017-04-09 NOTE — Assessment & Plan Note (Signed)
Severe likely familial HLD, LDL chronically > 200, has not taken statin in the past yr, but willing to start crestor 40 twice per wk, zetia 10 qd, and refer lipid clinic - may benefit from pcsk9

## 2017-04-09 NOTE — Assessment & Plan Note (Signed)

## 2017-04-09 NOTE — Assessment & Plan Note (Signed)
Mild, likely related to left inner ear/peripheral, for meclizine prn,  to f/u any worsening symptoms or concerns

## 2017-04-11 ENCOUNTER — Telehealth: Payer: Self-pay | Admitting: Internal Medicine

## 2017-04-11 MED ORDER — DIPHENOXYLATE-ATROPINE 2.5-0.025 MG PO TABS: 1.0000 | ORAL_TABLET | Freq: Four times a day (QID) | ORAL | 0 refills | Status: DC | PRN

## 2017-04-11 MED ORDER — ONDANSETRON HCL 4 MG PO TABS: 4.0000 mg | ORAL_TABLET | Freq: Three times a day (TID) | ORAL | 0 refills | Status: DC | PRN

## 2017-04-11 NOTE — Telephone Encounter (Signed)
[facility] called Pt wanted to get lab results still not feeling well. Please advise? Still having diarrhea , abdominal pain and nausea.   Call pt @ [phone]. Thank you!

## 2017-04-11 NOTE — Telephone Encounter (Signed)
Please advise about symptoms and possible medication?

## 2017-04-11 NOTE — Telephone Encounter (Signed)
Pt called and would like to know if she can have something called in to help her? Please advise?

## 2017-04-11 NOTE — Telephone Encounter (Signed)
Pt.notified

## 2017-04-11 NOTE — Telephone Encounter (Signed)
[place] for lomotil prn - done erx, and zofran

## 2017-04-12 ENCOUNTER — Other Ambulatory Visit: Payer: Self-pay | Admitting: Internal Medicine

## 2017-04-12 DIAGNOSIS — E785 Hyperlipidemia, unspecified: Secondary | ICD-10-CM

## 2017-04-25 ENCOUNTER — Other Ambulatory Visit: Payer: Self-pay | Admitting: Family Medicine

## 2017-05-20 ENCOUNTER — Encounter: Payer: Self-pay | Admitting: Internal Medicine

## 2017-05-20 ENCOUNTER — Ambulatory Visit: Payer: 59 | Admitting: Internal Medicine

## 2017-05-20 VITALS — BP 132/78 | HR 60 | Temp 98.3°F | Ht 63.0 in

## 2017-05-20 DIAGNOSIS — I10 Essential (primary) hypertension: Secondary | ICD-10-CM | POA: Diagnosis not present

## 2017-05-20 DIAGNOSIS — R42 Dizziness and giddiness: Secondary | ICD-10-CM | POA: Diagnosis not present

## 2017-05-20 DIAGNOSIS — H9192 Unspecified hearing loss, left ear: Secondary | ICD-10-CM | POA: Diagnosis not present

## 2017-05-20 NOTE — Patient Instructions (Signed)
Your left ear was irrigated of wax today  Please continue all other medications as before, and refills have been done if requested.  Please have the pharmacy call with any other refills you may need.  Please keep your appointments with your specialists as you may have planned

## 2017-05-20 NOTE — Progress Notes (Signed)
Subjective:    Patient ID: [name], female    DOB: 1950-06-20, 67 y.o.   MRN: [id number]  HPI  Here with acute onset left hearing loss x 1 wk for unclear reasons.  She has some sinus congestion and was seen in ED Dec 13 with vertigo now improved, and has no fever, worsening pain of any kind, ST, or cough.  States she can "hear herself" talk and chew.  Pt denies chest pain, increased sob or doe, wheezing, orthopnea, PND, increased LE swelling, palpitations, dizziness or syncope.   Pt denies polydipsia, polyuria.   Past Medical History:  Diagnosis Date  . ANXIETY 12/01/2006  . Blepharospasm 12/01/2006  . BONE SPUR 12/01/2006  . Cellulitis and abscess of oral soft tissues 05/01/2008  . Cervicalgia 08/24/2008  . COLD SORE 05/30/2008  . DEPRESSION 12/01/2006  . EXCISION OF GANGLION CYST, WRIST, HX OF 12/01/2006  . GERD 12/01/2006  . HYPERLIPIDEMIA 12/01/2006  . HYPERTENSION, BORDERLINE 12/01/2006  . LOW BACK PAIN 12/01/2006  . MACULAR DEGENERATION 12/01/2006  . OTITIS MEDIA, ACUTE, LEFT 05/30/2008  . PARESTHESIA 08/24/2008  . SINUSITIS- ACUTE-NOS 01/07/2010   Past Surgical History:  Procedure Laterality Date  . ABDOMINAL HYSTERECTOMY    . blepharospasm    . bone spur     removal right foot  . BREAST LUMPECTOMY     right  . GANGLION CYST EXCISION    . H/O Macular degeneration      reports that she has been smoking.  she has never used smokeless tobacco. She reports that she does not drink alcohol or use drugs. family history includes Diabetes in her daughter; Heart attack in her father; Heart disease in her father; Hypertension in her mother; Pulmonary fibrosis in her mother. Allergies  Allergen Reactions  . Codeine   . Hydrocodone-Acetaminophen   . Loratadine-Pseudoephedrine Er   . Statins Other (See Comments)    Joint and muscle cramp   Current Outpatient Medications on File Prior to Visit  Medication Sig Dispense Refill  . aspirin 81 MG tablet Take 81 mg by mouth daily.      Marland Kitchen buPROPion  (WELLBUTRIN XL) 300 MG 24 hr tablet Take 1 tablet (300 mg total) by mouth daily. 90 tablet 3  . clonazePAM (KLONOPIN) 1 MG tablet TAKE 1/2 TO 1 TABLET BY MOUTH TWICE DAILY AS NEEDED 80 tablet 2  . cyclobenzaprine (FLEXERIL) 5 MG tablet Take 1 tablet (5 mg total) by mouth 3 (three) times daily as needed for muscle spasms. 40 tablet 1  . Diclofenac Sodium (PENNSAID) 2 % SOLN Place 2 application onto the skin 2 (two) times daily. 112 g 3  . diphenoxylate-atropine (LOMOTIL) 2.5-0.025 MG tablet Take 1 tablet by mouth 4 (four) times daily as needed for diarrhea or loose stools. 30 tablet 0  . ezetimibe (ZETIA) 10 MG tablet Take 1 tablet (10 mg total) by mouth daily. 90 tablet 3  . FLUoxetine (PROZAC) 40 MG capsule TAKE ONE CAPSULE BY MOUTH ONCE DAILY 90 capsule 3  . ibuprofen (ADVIL,MOTRIN) 800 MG tablet Take 1 tablet (800 mg total) by mouth every 8 (eight) hours as needed. 40 tablet 1  . meclizine (ANTIVERT) 12.5 MG tablet Take 1 tablet (12.5 mg total) by mouth 3 (three) times daily as needed for dizziness. 30 tablet 1  . omeprazole (PRILOSEC) 20 MG capsule TAKE ONE CAPSULE BY MOUTH TWICE DAILY AS NEEDED 180 capsule 2  . ondansetron (ZOFRAN) 4 MG tablet Take 1 tablet (4 mg total) by  mouth every 8 (eight) hours as needed for nausea or vomiting. 20 tablet 0  . rosuvastatin (CRESTOR) 40 MG tablet 1 by mouth twice per week 24 tablet 3  . traMADol (ULTRAM) 50 MG tablet Take 1 tablet (50 mg total) by mouth every 8 (eight) hours as needed. 40 tablet 0  . valACYclovir (VALTREX) 1000 MG tablet TAKE ONE TABLET BY MOUTH TWICE DAILY 120 tablet 5  . Vitamin D, Ergocalciferol, (DRISDOL) 50000 units CAPS capsule TAKE 1 CAPSULE BY MOUTH EVERY 7 DAYS 12 capsule 0  . losartan (COZAAR) 50 MG tablet Take 1 tablet (50 mg total) by mouth daily. 90 tablet 3   No current facility-administered medications on file prior to visit.    Review of Systems  Constitutional: Negative for other unusual diaphoresis or sweats HENT:  Negative for ear discharge or swelling Eyes: Negative for other worsening visual disturbances Respiratory: Negative for stridor or other swelling  Gastrointestinal: Negative for worsening distension or other blood Genitourinary: Negative for retention or other urinary change Musculoskeletal: Negative for other MSK pain or swelling Skin: Negative for color change or other new lesions Neurological: Negative for worsening tremors and other numbness  Psychiatric/Behavioral: Negative for worsening agitation or other fatigue All other system neg per pt    Objective:   Physical Exam BP 132/78   Pulse 60   Temp 98.3 F (36.8 C) (Oral)   Ht 5\' 3"  (1.6 m)   SpO2 98%   BMI 23.38 kg/m  VS noted, not ill appearing Constitutional: Pt appears in NAD HENT: Head: NCAT.  Right Ear: External ear normal.  Left Ear: External ear normal. , Left TM with wax impaction resolved with irrigation, and hearing improved Eyes: . Pupils are equal, round, and reactive to light. Conjunctivae and EOM are normal Nose: without d/c or deformity Neck: Neck supple. Gross normal ROM Cardiovascular: Normal rate and regular rhythm.   Pulmonary/Chest: Effort normal and breath sounds without rales or wheezing.  Neurological: Pt is alert. At baseline orientation, motor grossly intact Skin: Skin is warm. No rashes, other new lesions, no LE edema Psychiatric: Pt behavior is normal without agitation  No other exam findings    Assessment & Plan:

## 2017-05-23 NOTE — Assessment & Plan Note (Signed)
stable overall by history and exam, recent data reviewed with pt, and pt to continue medical treatment as before,  to f/u any worsening symptoms or concerns BP Readings from Last 3 Encounters:  05/20/17 132/78  04/09/17 (!) 148/82  03/02/17 (!) 160/70

## 2017-05-23 NOTE — Assessment & Plan Note (Signed)
Resolved at least currently, likely benign positional vertigo,  to f/u any worsening symptoms or concerns

## 2017-05-23 NOTE — Assessment & Plan Note (Signed)
Resolved with irrigation, exam benign o/w,  to f/u any worsening symptoms or concerns

## 2017-05-26 ENCOUNTER — Ambulatory Visit: Payer: Self-pay | Admitting: Obstetrics & Gynecology

## 2017-06-01 ENCOUNTER — Other Ambulatory Visit: Payer: Self-pay | Admitting: Family Medicine

## 2017-06-01 ENCOUNTER — Other Ambulatory Visit: Payer: Self-pay

## 2017-06-01 MED ORDER — DICLOFENAC SODIUM 2 % TD SOLN: 2.0000 "application " | Freq: Two times a day (BID) | TRANSDERMAL | 3 refills | Status: DC

## 2017-07-08 ENCOUNTER — Encounter: Payer: Self-pay | Admitting: Obstetrics & Gynecology

## 2017-07-08 ENCOUNTER — Telehealth: Payer: Self-pay | Admitting: *Deleted

## 2017-07-08 ENCOUNTER — Ambulatory Visit (INDEPENDENT_AMBULATORY_CARE_PROVIDER_SITE_OTHER): Payer: 59 | Admitting: Obstetrics & Gynecology

## 2017-07-08 VITALS — BP 140/80 | Ht 62.0 in | Wt 129.8 lb

## 2017-07-08 DIAGNOSIS — Z780 Asymptomatic menopausal state: Secondary | ICD-10-CM | POA: Diagnosis not present

## 2017-07-08 DIAGNOSIS — Z90710 Acquired absence of both cervix and uterus: Secondary | ICD-10-CM | POA: Diagnosis not present

## 2017-07-08 DIAGNOSIS — R6882 Decreased libido: Secondary | ICD-10-CM

## 2017-07-08 DIAGNOSIS — Z13820 Encounter for screening for osteoporosis: Secondary | ICD-10-CM

## 2017-07-08 DIAGNOSIS — N952 Postmenopausal atrophic vaginitis: Secondary | ICD-10-CM | POA: Diagnosis not present

## 2017-07-08 DIAGNOSIS — Z01411 Encounter for gynecological examination (general) (routine) with abnormal findings: Secondary | ICD-10-CM | POA: Diagnosis not present

## 2017-07-08 MED ORDER — TESTOSTERONE 50 MG/5GM (1%) TD GEL: 5.0000 g | Freq: Every day | TRANSDERMAL | 5 refills | Status: DC

## 2017-07-08 NOTE — Telephone Encounter (Signed)
Prior authorization done online via cover my meds for Testerone 1% gel. Will wait for response.

## 2017-07-08 NOTE — Progress Notes (Signed)
[name] [birth year] [id number]   History:    67 y.o. G1P1L1 Married.  Great GM x 2  RP:  Established patient presenting for annual gyn exam   HPI: S/P Total Hysterectomy.  No pelvic pain.  Low libido causing tensions in couple.  C/O small bumps near buttocks, not painful, no itching. Pain with IC d/t dryness.  Urine/BMs wnl.  Breasts wnl.  Stable depression on Wellbutrin and Prozac. Hypercholesterolemia, will start Statin.  Health labs with Fam MD.  Past medical history,surgical history, family history and social history were all reviewed and documented in the EPIC chart.  Gynecologic History No LMP recorded. Patient has had a hysterectomy. Contraception: status post hysterectomy Last Pap: 01/2015. Results were: Negative/HPV HR neg Last mammogram: 04/2016. Results were: Benign Bone Density: 2010 Colonoscopy: Never, declines, will plan alternatives with Fam MD  Obstetric History OB History  Gravida Para Term Preterm AB Living  1 1       1   SAB TAB Ectopic Multiple Live Births               # Outcome Date GA Lbr Len/2nd Weight Sex Delivery Anes PTL Lv  1 Para                ROS: A ROS was performed and pertinent positives and negatives are included in the history.  GENERAL: No fevers or chills. HEENT: No change in vision, no earache, sore throat or sinus congestion. NECK: No pain or stiffness. CARDIOVASCULAR: No chest pain or pressure. No palpitations. PULMONARY: No shortness of breath, cough or wheeze. GASTROINTESTINAL: No abdominal pain, nausea, vomiting or diarrhea, melena or bright red blood per rectum. GENITOURINARY: No urinary frequency, urgency, hesitancy or dysuria. MUSCULOSKELETAL: No joint or muscle pain, no back pain, no recent trauma. DERMATOLOGIC: No rash, no itching, no lesions. ENDOCRINE: No polyuria, polydipsia, no heat or cold intolerance. No recent change in weight. HEMATOLOGICAL: No anemia or easy bruising or bleeding. NEUROLOGIC: No headache, seizures,  numbness, tingling or weakness. PSYCHIATRIC: No depression, no loss of interest in normal activity or change in sleep pattern.     Exam:   BP 140/80   Ht 5\' 2"  (1.575 m)   Wt 129 lb 12.8 oz (58.9 kg)   BMI 23.74 kg/m   Body mass index is 23.74 kg/m.  General appearance : Well developed well nourished female. No acute distress HEENT: Eyes: no retinal hemorrhage or exudates,  Neck supple, trachea midline, no carotid bruits, no thyroidmegaly Lungs: Clear to auscultation, no rhonchi or wheezes, or rib retractions  Heart: Regular rate and rhythm, no murmurs or gallops Breast:Examined in sitting and supine position were symmetrical in appearance, no palpable masses or tenderness,  no skin retraction, no nipple inversion, no nipple discharge, no skin discoloration, no axillary or supraclavicular lymphadenopathy Abdomen: no palpable masses or tenderness, no rebound or guarding Extremities: no edema or skin discoloration or tenderness  Pelvic: Vulva: Normal.  A few sebaceous glands inflamed in buttocks area corresponding to the "bumps" patient had noticed.             Vagina: No gross lesions or discharge.  Pap reflex done  Cervix/Uterus Absent  Adnexa  Without masses or tenderness  Anus: Normal   Assessment/Plan:  67 y.o. female for annual exam   1. Encounter for gynecological examination with abnormal finding Gynecologic exam status post hysterectomy.  Pap reflex done.  Breast exam normal.  Patient will schedule an annual screening mammogram.  Health labs  with family physician.  Will organize: Screening with family physician.  2. History of total hysterectomy  3. Menopause present Well on no hormone replacement therapy.  4. Post-menopausal atrophic vaginitis Declines estrogen products.  Recommend coconut oil with intercourse.  5. Low libido Counseling done.  Given that the low libido is disrupting her relationship with her husband, decision to attempt improving her libido with a  testosterone gel.  Will also use coconut oil with intercourse as mentioned above.  6. Screening for osteoporosis Vitamin D supplements, calcium rich nutrition and regular weightbearing physical activity recommended. - DG Bone Density; Future  Other orders - testosterone (ANDROGEL) 50 MG/5GM (1%) GEL; Place 5 g onto the skin daily.  Counseling and coordination of care on above issues more than 50% for 10 minutes.  Princess Bruins MD, 10:25 AM 07/08/2017

## 2017-07-09 NOTE — Telephone Encounter (Signed)
Pt medication for testosterone gel 1% was denied by insurance with local pharmacy.  Maybe we can try testosterone cream 2% at compound pharmacy? The compound pharmacy only has 2% and 4% cream the 2% cream is typically the most common. Please advise

## 2017-07-10 MED ORDER — NONFORMULARY OR COMPOUNDED ITEM: 11 refills | Status: DC

## 2017-07-10 NOTE — Telephone Encounter (Signed)
Patient called in voice mail inquiring about this. I called her back and left message for her to call me.

## 2017-07-10 NOTE — Telephone Encounter (Signed)
Agree with 2% Testo cream at compound pharmacy.

## 2017-07-13 ENCOUNTER — Encounter: Payer: Self-pay | Admitting: Obstetrics & Gynecology

## 2017-07-13 LAB — PAP IG W/ RFLX HPV ASCU

## 2017-07-13 NOTE — Patient Instructions (Addendum)
1. Encounter for gynecological examination with abnormal finding Gynecologic exam status post hysterectomy.  Pap reflex done.  Breast exam normal.  Patient will schedule an annual screening mammogram.  Health labs with family physician.  Will organize: Screening with family physician.  2. History of total hysterectomy  3. Menopause present Well on no hormone replacement therapy.  4. Post-menopausal atrophic vaginitis Declines estrogen products.  Recommend coconut oil with intercourse.  5. Low libido Counseling done.  Given that the low libido is disrupting her relationship with her husband, decision to attempt improving her libido with a testosterone gel.  Will also use coconut oil with intercourse as mentioned above.  6. Screening for osteoporosis Vitamin D supplements, calcium rich nutrition and regular weightbearing physical activity recommended. - DG Bone Density; Future  Other orders - testosterone (ANDROGEL) 50 MG/5GM (1%) GEL; Place 5 g onto the skin daily.  Romi, it was a pleasure seeing you today!  I will inform you of your results as soon as they are available.   Health Maintenance for Postmenopausal Women Menopause is a normal process in which your reproductive ability comes to an end. This process happens gradually over a span of months to years, usually between the ages of 27 and 62. Menopause is complete when you have missed 12 consecutive menstrual periods. It is important to talk with your health care provider about some of the most common conditions that affect postmenopausal women, such as heart disease, cancer, and bone loss (osteoporosis). Adopting a healthy lifestyle and getting preventive care can help to promote your health and wellness. Those actions can also lower your chances of developing some of these common conditions. What should I know about menopause? During menopause, you may experience a number of symptoms, such as:  Moderate-to-severe hot  flashes.  Night sweats.  Decrease in sex drive.  Mood swings.  Headaches.  Tiredness.  Irritability.  Memory problems.  Insomnia.  Choosing to treat or not to treat menopausal changes is an individual decision that you make with your health care provider. What should I know about hormone replacement therapy and supplements? Hormone therapy products are effective for treating symptoms that are associated with menopause, such as hot flashes and night sweats. Hormone replacement carries certain risks, especially as you become older. If you are thinking about using estrogen or estrogen with progestin treatments, discuss the benefits and risks with your health care provider. What should I know about heart disease and stroke? Heart disease, heart attack, and stroke become more likely as you age. This may be due, in part, to the hormonal changes that your body experiences during menopause. These can affect how your body processes dietary fats, triglycerides, and cholesterol. Heart attack and stroke are both medical emergencies. There are many things that you can do to help prevent heart disease and stroke:  Have your blood pressure checked at least every 1-2 years. High blood pressure causes heart disease and increases the risk of stroke.  If you are 23-27 years old, ask your health care provider if you should take aspirin to prevent a heart attack or a stroke.  Do not use any tobacco products, including cigarettes, chewing tobacco, or electronic cigarettes. If you need help quitting, ask your health care provider.  It is important to eat a healthy diet and maintain a healthy weight. ? Be sure to include plenty of vegetables, fruits, low-fat dairy products, and lean protein. ? Avoid eating foods that are high in solid fats, added sugars, or salt (  sodium).  Get regular exercise. This is one of the most important things that you can do for your health. ? Try to exercise for at least 150  minutes each week. The type of exercise that you do should increase your heart rate and make you sweat. This is known as moderate-intensity exercise. ? Try to do strengthening exercises at least twice each week. Do these in addition to the moderate-intensity exercise.  Know your numbers.Ask your health care provider to check your cholesterol and your blood glucose. Continue to have your blood tested as directed by your health care provider.  What should I know about cancer screening? There are several types of cancer. Take the following steps to reduce your risk and to catch any cancer development as early as possible. Breast Cancer  Practice breast self-awareness. ? This means understanding how your breasts normally appear and feel. ? It also means doing regular breast self-exams. Let your health care provider know about any changes, no matter how small.  If you are 2 or older, have a clinician do a breast exam (clinical breast exam or CBE) every year. Depending on your age, family history, and medical history, it may be recommended that you also have a yearly breast X-ray (mammogram).  If you have a family history of breast cancer, talk with your health care provider about genetic screening.  If you are at high risk for breast cancer, talk with your health care provider about having an MRI and a mammogram every year.  Breast cancer (BRCA) gene test is recommended for women who have family members with BRCA-related cancers. Results of the assessment will determine the need for genetic counseling and BRCA1 and for BRCA2 testing. BRCA-related cancers include these types: ? Breast. This occurs in males or females. ? Ovarian. ? Tubal. This may also be called fallopian tube cancer. ? Cancer of the abdominal or pelvic lining (peritoneal cancer). ? Prostate. ? Pancreatic.  Cervical, Uterine, and Ovarian Cancer Your health care provider may recommend that you be screened regularly for cancer  of the pelvic organs. These include your ovaries, uterus, and vagina. This screening involves a pelvic exam, which includes checking for microscopic changes to the surface of your cervix (Pap test).  For women ages 21-65, health care providers may recommend a pelvic exam and a Pap test every three years. For women ages 69-65, they may recommend the Pap test and pelvic exam, combined with testing for human papilloma virus (HPV), every five years. Some types of HPV increase your risk of cervical cancer. Testing for HPV may also be done on women of any age who have unclear Pap test results.  Other health care providers may not recommend any screening for nonpregnant women who are considered low risk for pelvic cancer and have no symptoms. Ask your health care provider if a screening pelvic exam is right for you.  If you have had past treatment for cervical cancer or a condition that could lead to cancer, you need Pap tests and screening for cancer for at least 20 years after your treatment. If Pap tests have been discontinued for you, your risk factors (such as having a new sexual partner) need to be reassessed to determine if you should start having screenings again. Some women have medical problems that increase the chance of getting cervical cancer. In these cases, your health care provider may recommend that you have screening and Pap tests more often.  If you have a family history of uterine cancer  or ovarian cancer, talk with your health care provider about genetic screening.  If you have vaginal bleeding after reaching menopause, tell your health care provider.  There are currently no reliable tests available to screen for ovarian cancer.  Lung Cancer Lung cancer screening is recommended for adults 30-24 years old who are at high risk for lung cancer because of a history of smoking. A yearly low-dose CT scan of the lungs is recommended if you:  Currently smoke.  Have a history of at least 30  pack-years of smoking and you currently smoke or have quit within the past 15 years. A pack-year is smoking an average of one pack of cigarettes per day for one year.  Yearly screening should:  Continue until it has been 15 years since you quit.  Stop if you develop a health problem that would prevent you from having lung cancer treatment.  Colorectal Cancer  This type of cancer can be detected and can often be prevented.  Routine colorectal cancer screening usually begins at age 3 and continues through age 76.  If you have risk factors for colon cancer, your health care provider may recommend that you be screened at an earlier age.  If you have a family history of colorectal cancer, talk with your health care provider about genetic screening.  Your health care provider may also recommend using home test kits to check for hidden blood in your stool.  A small camera at the end of a tube can be used to examine your colon directly (sigmoidoscopy or colonoscopy). This is done to check for the earliest forms of colorectal cancer.  Direct examination of the colon should be repeated every 5-10 years until age 47. However, if early forms of precancerous polyps or small growths are found or if you have a family history or genetic risk for colorectal cancer, you may need to be screened more often.  Skin Cancer  Check your skin from head to toe regularly.  Monitor any moles. Be sure to tell your health care provider: ? About any new moles or changes in moles, especially if there is a change in a mole's shape or color. ? If you have a mole that is larger than the size of a pencil eraser.  If any of your family members has a history of skin cancer, especially at a young age, talk with your health care provider about genetic screening.  Always use sunscreen. Apply sunscreen liberally and repeatedly throughout the day.  Whenever you are outside, protect yourself by wearing long sleeves, pants,  a wide-brimmed hat, and sunglasses.  What should I know about osteoporosis? Osteoporosis is a condition in which bone destruction happens more quickly than new bone creation. After menopause, you may be at an increased risk for osteoporosis. To help prevent osteoporosis or the bone fractures that can happen because of osteoporosis, the following is recommended:  If you are 31-5 years old, get at least 1,000 mg of calcium and at least 600 mg of vitamin D per day.  If you are older than age 46 but younger than age 38, get at least 1,200 mg of calcium and at least 600 mg of vitamin D per day.  If you are older than age 31, get at least 1,200 mg of calcium and at least 800 mg of vitamin D per day.  Smoking and excessive alcohol intake increase the risk of osteoporosis. Eat foods that are rich in calcium and vitamin D, and do weight-bearing exercises  several times each week as directed by your health care provider. What should I know about how menopause affects my mental health? Depression may occur at any age, but it is more common as you become older. Common symptoms of depression include:  Low or sad mood.  Changes in sleep patterns.  Changes in appetite or eating patterns.  Feeling an overall lack of motivation or enjoyment of activities that you previously enjoyed.  Frequent crying spells.  Talk with your health care provider if you think that you are experiencing depression. What should I know about immunizations? It is important that you get and maintain your immunizations. These include:  Tetanus, diphtheria, and pertussis (Tdap) booster vaccine.  Influenza every year before the flu season begins.  Pneumonia vaccine.  Shingles vaccine.  Your health care provider may also recommend other immunizations. This information is not intended to replace advice given to you by your health care provider. Make sure you discuss any questions you have with your health care  provider. Document Released: 06/06/2005 Document Revised: 11/02/2015 Document Reviewed: 01/16/2015 Elsevier Interactive Patient Education  2018 [employer].

## 2017-07-15 NOTE — Telephone Encounter (Signed)
Spoke with patient and informed her. °

## 2017-07-15 NOTE — Telephone Encounter (Signed)
Patient called. When I called Rx for Testosterone Cream into [facility] pharmacist assisted me with directions.  Patient was instructed to [place] 1-2 clicks from the dispense to apply clitorally at bedtime. (1/4-1/2 gram).  Patient called today and said she thought you had told her to only use it twice weekly. Original Testosterone gel Rx that was not covered by insurance also had daily instructions.  Please advise how you would recommend her to use this compound Rx?

## 2017-07-15 NOTE — Telephone Encounter (Signed)
Daily application as indicated.

## 2017-07-29 ENCOUNTER — Other Ambulatory Visit: Payer: Self-pay | Admitting: Internal Medicine

## 2017-07-29 MED ORDER — CLONAZEPAM 1 MG PO TABS: ORAL_TABLET | ORAL | 2 refills | Status: DC

## 2017-07-29 NOTE — Telephone Encounter (Signed)
Copied from [place] [id number]. Topic: Quick Communication - See Telephone Encounter >> Jul 29, 2017  1:48 PM Conception Chancy, NT wrote: CRM for notification. See Telephone encounter for: 07/29/17.  Patient is calling and needing a refill on clonazePAM (KLONOPIN) 1 MG tablet. Please advise.  [facility] - [place], [place] - [address] AT [address] [place] [address] Phone: [phone] Fax: [phone]

## 2017-07-29 NOTE — Telephone Encounter (Signed)
Refill of Klonopin  LOV 04/09/18 when issue was addressed  Dr. [facility]erv[id number] - [place], [place] - [address] AT [address]

## 2017-07-29 NOTE — Telephone Encounter (Signed)
Last filled 06/01/2017 30#

## 2017-07-29 NOTE — Telephone Encounter (Signed)
Done erx 

## 2017-07-29 NOTE — Telephone Encounter (Signed)
Patient stated her pharmacy and her have called multiple times about this medication. I do not see any documentation of it. Please advise.

## 2017-08-18 NOTE — Progress Notes (Signed)
[facility] [address], [place] [address] Phone: [phone] Subjective:    CC: Hand pain  [id number]  [name] is a 67 y.o. female coming in with complaint of hand pain.  Bilateral arthritis.  Has had injections of the [facility] previously.  Last one in October.  Patient states having worsening pain again.  And seems to be bilateral.  Patient states that he is having some difficulty with daily activities such as opening jars or even holding children.      Past Medical History:  Diagnosis Date  . ANXIETY 12/01/2006  . Arthritis   . Blepharospasm 12/01/2006  . BONE SPUR 12/01/2006  . Cellulitis and abscess of oral soft tissues 05/01/2008  . Cervicalgia 08/24/2008  . COLD SORE 05/30/2008  . DEPRESSION 12/01/2006  . EXCISION OF GANGLION CYST, WRIST, HX OF 12/01/2006  . GERD 12/01/2006  . HYPERLIPIDEMIA 12/01/2006  . HYPERTENSION, BORDERLINE 12/01/2006  . LOW BACK PAIN 12/01/2006  . MACULAR DEGENERATION 12/01/2006  . OTITIS MEDIA, ACUTE, LEFT 05/30/2008  . PARESTHESIA 08/24/2008  . SINUSITIS- ACUTE-NOS 01/07/2010   Past Surgical History:  Procedure Laterality Date  . ABDOMINAL HYSTERECTOMY    . blepharospasm    . bone spur     removal right foot  . BREAST LUMPECTOMY     right  . GANGLION CYST EXCISION    . H/O Macular degeneration     Social History   Socioeconomic History  . Marital status: Married    Spouse name: Not on file  . Number of children: 1  . Years of education: Not on file  . Highest education level: Not on file  Occupational History    Employer: [employer]  Social Needs  . Financial resource strain: Not on file  . Food insecurity:    Worry: Not on file    Inability: Not on file  . Transportation needs:    Medical: Not on file    Non-medical: Not on file  Tobacco Use  . Smoking status: Current Every Day Smoker  . Smokeless tobacco: Never Used  Substance and Sexual Activity  . Alcohol use: No  . Drug use: No  . Sexual  activity: Never    Comment: 1st intercourse- 19, partners-  married- 21 yrs   Lifestyle  . Physical activity:    Days per week: Not on file    Minutes per session: Not on file  . Stress: Not on file  Relationships  . Social connections:    Talks on phone: Not on file    Gets together: Not on file    Attends religious service: Not on file    Active member of club or organization: Not on file    Attends meetings of clubs or organizations: Not on file    Relationship status: Not on file  Other Topics Concern  . Not on file  Social History Narrative  . Not on file   Allergies  Allergen Reactions  . Codeine   . Hydrocodone-Acetaminophen   . Loratadine-Pseudoephedrine Er   . Statins Other (See Comments)    Joint and muscle cramp   Family History  Problem Relation Age of Onset  . Hypertension Mother   . Pulmonary fibrosis Mother   . Heart disease Father   . Heart attack Father   . Diabetes Daughter      Past medical history, social, surgical and family history all reviewed in electronic medical record.  No pertanent information unless  stated regarding to the chief complaint.   Review of Systems:Review of systems updated and as accurate as of 08/19/17  No headache, visual changes, nausea, vomiting, diarrhea, constipation, dizziness, abdominal pain, skin rash, fevers, chills, night sweats, weight loss, swollen lymph nodes, body aches, joint swelling, chest pain, shortness of breath, mood changes.  Positive muscle aches  Objective  Blood pressure (!) 144/60, pulse (!) 42, height 5\' 2"  (1.575 m), weight 131 lb (59.4 kg), SpO2 99 %. Systems examined below as of 08/19/17   General: No apparent distress alert and oriented x3 mood and affect normal, dressed appropriately.  HEENT: Pupils equal, extraocular movements intact  Respiratory: Patient's speak in full sentences and does not appear short of breath  Cardiovascular: No lower extremity edema, non tender, no erythema  Skin:  Warm dry intact with no signs of infection or rash on extremities or on axial skeleton.  Abdomen: Soft nontender  Neuro: Cranial nerves II through XII are intact, neurovascularly intact in all extremities with 2+ DTRs and 2+ pulses.  Lymph: No lymphadenopathy of posterior or anterior cervical chain or axillae bilaterally.  Gait normal with good balance and coordination.  MSK:  Non tender with full range of motion and good stability and symmetric strength and tone of shoulders, elbows, wrist, hip, knee and ankles bilat erally.  Hand exam. Shows CMC arthritis bilaterally  4/5 strength + grind.    Procedure: Real-time Ultrasound Guided Injection of right CMC joint Device: GE Logiq Q7 Ultrasound guided injection is preferred based studies that show increased duration, increased effect, greater accuracy, decreased procedural pain, increased response rate, and decreased cost with ultrasound guided versus blind injection.  Verbal informed consent obtained.  Time-out conducted.  Noted no overlying erythema, induration, or other signs of local infection.  Skin prepped in a sterile fashion.  Local anesthesia: Topical Ethyl chloride.  With sterile technique and under real time ultrasound guidance: A 25-gauge half inch needle injected with 0.5 cc of 0.5% Marcaine and 0.5 cc of Kenalog 40 mill grams per mL Completed without difficulty  Pain immediately resolved suggesting accurate placement of the medication.  Advised to call if fevers/chills, erythema, induration, drainage, or persistent bleeding.  Images permanently stored and available for review in the ultrasound unit.  Impression: Technically successful ultrasound guided injection.  Procedure: Real-time Ultrasound Guided Injection of left CMC joint Device: GE Logiq Q7 Ultrasound guided injection is preferred based studies that show increased duration, increased effect, greater accuracy, decreased procedural pain, increased response rate, and  decreased cost with ultrasound guided versus blind injection.  Verbal informed consent obtained.  Time-out conducted.  Noted no overlying erythema, induration, or other signs of local infection.  Skin prepped in a sterile fashion.  Local anesthesia: Topical Ethyl chloride.  With sterile technique and under real time ultrasound guidance: With a 25-gauge half inch needle injected with 0.5 cc of 0.5% Marcaine and 0.5 cc of Kenalog 40 mg/mL Completed without difficulty  Pain immediately resolved suggesting accurate placement of the medication.  Advised to call if fevers/chills, erythema, induration, drainage, or persistent bleeding.  Images permanently stored and available for review in the ultrasound unit.  Impression: Technically successful ultrasound guided injection.   Impression and Recommendations:     This case required medical decision making of moderate complexity.      Note: This dictation was prepared with Dragon dictation along with smaller phrase technology. Any transcriptional errors that result from this process are unintentional.

## 2017-08-19 ENCOUNTER — Ambulatory Visit: Payer: 59 | Admitting: Family Medicine

## 2017-08-19 ENCOUNTER — Encounter: Payer: Self-pay | Admitting: Family Medicine

## 2017-08-19 ENCOUNTER — Ambulatory Visit: Payer: Self-pay

## 2017-08-19 VITALS — BP 144/60 | HR 42 | Ht 62.0 in | Wt 131.0 lb

## 2017-08-19 DIAGNOSIS — M79641 Pain in right hand: Secondary | ICD-10-CM

## 2017-08-19 DIAGNOSIS — M79642 Pain in left hand: Secondary | ICD-10-CM

## 2017-08-19 DIAGNOSIS — M180 Bilateral primary osteoarthritis of first carpometacarpal joints: Secondary | ICD-10-CM | POA: Diagnosis not present

## 2017-08-19 NOTE — Patient Instructions (Signed)
Good to see you  Injections again  pennsaid pinkie amount topically 2 times daily as needed.  Ice is your friend Follow up again in 4 weeks

## 2017-08-19 NOTE — Assessment & Plan Note (Signed)
Bilateral injections.  Discussed icing regimen and home exercises.  Discussed with activity which wants to avoid.  Patient was to increase activity slowly over the course the next several days.  Bracing still at night.  Topical anti-inflammatories.  Follow-up again in 4 weeks

## 2017-09-17 ENCOUNTER — Other Ambulatory Visit: Payer: Self-pay | Admitting: Internal Medicine

## 2017-09-22 ENCOUNTER — Other Ambulatory Visit: Payer: Self-pay | Admitting: Internal Medicine

## 2017-09-23 ENCOUNTER — Ambulatory Visit: Payer: 59 | Admitting: Obstetrics & Gynecology

## 2017-10-23 ENCOUNTER — Ambulatory Visit: Payer: 59 | Admitting: Internal Medicine

## 2017-10-23 ENCOUNTER — Encounter: Payer: Self-pay | Admitting: Internal Medicine

## 2017-10-23 VITALS — BP 142/86 | HR 77 | Temp 97.8°F | Ht 62.0 in | Wt 125.0 lb

## 2017-10-23 DIAGNOSIS — Z0000 Encounter for general adult medical examination without abnormal findings: Secondary | ICD-10-CM

## 2017-10-23 DIAGNOSIS — F411 Generalized anxiety disorder: Secondary | ICD-10-CM

## 2017-10-23 DIAGNOSIS — R51 Headache: Secondary | ICD-10-CM | POA: Diagnosis not present

## 2017-10-23 DIAGNOSIS — I10 Essential (primary) hypertension: Secondary | ICD-10-CM

## 2017-10-23 DIAGNOSIS — R519 Headache, unspecified: Secondary | ICD-10-CM

## 2017-10-23 MED ORDER — HYDROCODONE-ACETAMINOPHEN 5-325 MG PO TABS: 1.0000 | ORAL_TABLET | Freq: Four times a day (QID) | ORAL | 0 refills | Status: DC | PRN

## 2017-10-23 MED ORDER — CYCLOBENZAPRINE HCL 5 MG PO TABS: 5.0000 mg | ORAL_TABLET | Freq: Three times a day (TID) | ORAL | 1 refills | Status: DC | PRN

## 2017-10-23 MED ORDER — GABAPENTIN 100 MG PO CAPS: 100.0000 mg | ORAL_CAPSULE | Freq: Three times a day (TID) | ORAL | 3 refills | Status: DC

## 2017-10-23 NOTE — Patient Instructions (Addendum)
Please take all new medication as prescribed - the pain medication (vicodin), muscle relaxer, and gabapentin  OK to call in a few days if you need higher dose of the gabapentin for nerve pain  You are given the work note today  Please continue all other medications as before, and refills have been done if requested.  Please have the pharmacy call with any other refills you may need.  Please keep your appointments with your specialists as you may have planned  Please return in 6 months, or sooner if needed, with Lab testing done 3-5 days before

## 2017-10-23 NOTE — Assessment & Plan Note (Signed)
stable overall by history and exam, recent data reviewed with pt, and pt to continue medical treatment as before,  to f/u any worsening symptoms or concerns  

## 2017-10-23 NOTE — Assessment & Plan Note (Signed)
Mild elevated, likely reactive, o/w stable overall by history and exam, recent data reviewed with pt, and pt to continue medical treatment as before,  to f/u any worsening symptoms or concerns BP Readings from Last 3 Encounters:  10/23/17 (!) 142/86  08/19/17 (!) 144/60  07/08/17 140/80

## 2017-10-23 NOTE — Progress Notes (Signed)
Subjective:    Patient ID: [name], female    DOB: 1950/10/12, 67 y.o.   MRN: [id number]  HPI  Here with ? migrine vs other type of left occipital HA for 7 days since she was at the hair stylist and had to lean back over the edge of the porcelein sink, overall some better today mostly sharp pain but occasionlly burning; sleeps with taking 2 of her sleep meds;  Ibuprofen has not helped so far, tried sinus otc meds and excedrin migraine no help.  Leaning back makes worse, massage kind of helps. No other neck or arm pain.   Pt denies chest pain, increased sob or doe, wheezing, orthopnea, PND, increased LE swelling, palpitations, dizziness or syncope.   Pt denies fever, wt loss, night sweats, loss of appetite, or other constitutional symptoms  No trauma.  Denies worsening depressive symptoms, suicidal ideation, or panic; has ongoing anxiety, not increased recently.  Past Medical History:  Diagnosis Date  . ANXIETY 12/01/2006  . Arthritis   . Blepharospasm 12/01/2006  . BONE SPUR 12/01/2006  . Cellulitis and abscess of oral soft tissues 05/01/2008  . Cervicalgia 08/24/2008  . COLD SORE 05/30/2008  . DEPRESSION 12/01/2006  . EXCISION OF GANGLION CYST, WRIST, HX OF 12/01/2006  . GERD 12/01/2006  . HYPERLIPIDEMIA 12/01/2006  . HYPERTENSION, BORDERLINE 12/01/2006  . LOW BACK PAIN 12/01/2006  . MACULAR DEGENERATION 12/01/2006  . OTITIS MEDIA, ACUTE, LEFT 05/30/2008  . PARESTHESIA 08/24/2008  . SINUSITIS- ACUTE-NOS 01/07/2010   Past Surgical History:  Procedure Laterality Date  . ABDOMINAL HYSTERECTOMY    . blepharospasm    . bone spur     removal right foot  . BREAST LUMPECTOMY     right  . GANGLION CYST EXCISION    . H/O Macular degeneration      reports that she has been smoking.  She has never used smokeless tobacco. She reports that she does not drink alcohol or use drugs. family history includes Diabetes in her daughter; Heart attack in her father; Heart disease in her father; Hypertension in her mother;  Pulmonary fibrosis in her mother. Allergies  Allergen Reactions  . Codeine   . Hydrocodone-Acetaminophen   . Loratadine-Pseudoephedrine Er   . Statins Other (See Comments)    Joint and muscle cramp   Current Outpatient Medications on File Prior to Visit  Medication Sig Dispense Refill  . aspirin 81 MG tablet Take 81 mg by mouth daily.      Marland Kitchen buPROPion (WELLBUTRIN XL) 300 MG 24 hr tablet Take 1 tablet (300 mg total) by mouth daily. 90 tablet 3  . clonazePAM (KLONOPIN) 1 MG tablet TAKE 1/2 TO 1 TABLET BY MOUTH TWICE DAILY AS NEEDED 60 tablet 2  . FLUoxetine (PROZAC) 40 MG capsule TAKE ONE CAPSULE BY MOUTH ONCE DAILY 90 capsule 3  . ibuprofen (ADVIL,MOTRIN) 800 MG tablet Take 1 tablet (800 mg total) by mouth every 8 (eight) hours as needed. 40 tablet 1  . losartan (COZAAR) 50 MG tablet TAKE 1 TABLET BY MOUTH ONCE DAILY 30 tablet 5  . meclizine (ANTIVERT) 12.5 MG tablet Take 1 tablet (12.5 mg total) by mouth 3 (three) times daily as needed for dizziness. 30 tablet 1  . NONFORMULARY OR COMPOUNDED ITEM Testosterone Cream 2% 15 Gram dispenser S:  Apply clitorally 6/2-8/3 gram (1-2 clicks from dispenser) 1 each 11  . ondansetron (ZOFRAN) 4 MG tablet Take 1 tablet (4 mg total) by mouth every 8 (eight) hours as needed  for nausea or vomiting. 20 tablet 0  . testosterone (ANDROGEL) 50 MG/5GM (1%) GEL Place 5 g onto the skin daily. 30 Tube 5  . valACYclovir (VALTREX) 1000 MG tablet TAKE 1 TABLET BY MOUTH TWICE DAILY 120 tablet 0  . Vitamin D, Ergocalciferol, (DRISDOL) 50000 units CAPS capsule TAKE 1 CAPSULE BY MOUTH EVERY 7 DAYS 12 capsule 0   No current facility-administered medications on file prior to visit.    Review of Systems  Constitutional: Negative for other unusual diaphoresis or sweats HENT: Negative for ear discharge or swelling Eyes: Negative for other worsening visual disturbances Respiratory: Negative for stridor or other swelling  Gastrointestinal: Negative for worsening distension  or other blood Genitourinary: Negative for retention or other urinary change Musculoskeletal: Negative for other MSK pain or swelling Skin: Negative for color change or other new lesions Neurological: Negative for worsening tremors and other numbness  Psychiatric/Behavioral: Negative for worsening agitation or other fatigue All other system neg per pt    Objective:   Physical Exam BP (!) 142/86   Pulse 77   Temp 97.8 F (36.6 C) (Oral)   Ht 5\' 2"  (1.575 m)   Wt 125 lb (56.7 kg)   SpO2 96%   BMI 22.86 kg/m  VS noted,  Constitutional: Pt appears in NAD HENT: Head: NCAT.  Right Ear: External ear normal.  Left Ear: External ear normal.  Eyes: . Pupils are equal, round, and reactive to light. Conjunctivae and EOM are normal Nose: without d/c or deformity Neck: Neck supple. Gross normal ROM Cardiovascular: Normal rate and regular rhythm.   Pulmonary/Chest: Effort normal and breath sounds without rales or wheezing.  Abd:  Soft, NT, ND, + BS, no organomegaly + left mild trapezoid tender Neurological: Pt is alert. At baseline orientation, motor grossly intact Skin: Skin is warm. No rashes, other new lesions, no LE edema Psychiatric: Pt behavior is normal without agitation  No other exam findings Lab Results  Component Value Date   WBC 6.3 04/09/2017   HGB 13.7 04/09/2017   HCT 40.8 04/09/2017   PLT 239.0 04/09/2017   GLUCOSE 85 04/09/2017   CHOL 260 (H) 04/09/2017   TRIG 233.0 (H) 04/09/2017   HDL 42.30 04/09/2017   LDLDIRECT 194.0 04/09/2017   ALT 9 04/09/2017   AST 16 04/09/2017   NA 138 04/09/2017   K 4.3 04/09/2017   CL 103 04/09/2017   CREATININE 1.24 (H) 04/09/2017   BUN 15 04/09/2017   CO2 28 04/09/2017   TSH 2.71 04/09/2017       Assessment & Plan:

## 2017-10-23 NOTE — Assessment & Plan Note (Signed)
Left only, c/w ? Neuritic vs msk pain - for vicodin prn, muscle relaxer prn, and gabapentin asd, and work note for today

## 2017-11-23 ENCOUNTER — Telehealth: Payer: Self-pay | Admitting: Internal Medicine

## 2017-11-23 NOTE — Telephone Encounter (Signed)
Left message for patient to call back about brace.

## 2017-11-23 NOTE — Telephone Encounter (Signed)
Copied from [place] [phone]. Topic: General - Other >> Nov 23, 2017 10:39 AM Alfredia Ferguson R wrote: Pt called in and stated that her hand brace broke. She is requesting a call back.  CB# [phone]

## 2017-11-26 ENCOUNTER — Other Ambulatory Visit: Payer: Self-pay | Admitting: Internal Medicine

## 2017-11-26 NOTE — Telephone Encounter (Signed)
Done erx 

## 2017-11-30 ENCOUNTER — Other Ambulatory Visit: Payer: Self-pay | Admitting: Internal Medicine

## 2017-12-08 ENCOUNTER — Other Ambulatory Visit: Payer: Self-pay | Admitting: Internal Medicine

## 2018-01-12 ENCOUNTER — Other Ambulatory Visit: Payer: Self-pay | Admitting: Internal Medicine

## 2018-01-12 NOTE — Telephone Encounter (Signed)
Done erx 

## 2018-02-02 ENCOUNTER — Other Ambulatory Visit: Payer: Self-pay | Admitting: Obstetrics & Gynecology

## 2018-02-02 ENCOUNTER — Ambulatory Visit (HOSPITAL_BASED_OUTPATIENT_CLINIC_OR_DEPARTMENT_OTHER)
Admission: RE | Admit: 2018-02-02 | Discharge: 2018-02-02 | Disposition: A | Discharge status: Home / Self Care | Payer: 59 | Source: Ambulatory Visit | Attending: Obstetrics & Gynecology | Admitting: Obstetrics & Gynecology

## 2018-02-02 DIAGNOSIS — Z1231 Encounter for screening mammogram for malignant neoplasm of breast: Secondary | ICD-10-CM

## 2018-02-25 ENCOUNTER — Other Ambulatory Visit: Payer: Self-pay | Admitting: Internal Medicine

## 2018-03-07 ENCOUNTER — Other Ambulatory Visit: Payer: Self-pay | Admitting: Internal Medicine

## 2018-05-24 ENCOUNTER — Other Ambulatory Visit: Payer: Self-pay | Admitting: Internal Medicine

## 2018-05-24 NOTE — Telephone Encounter (Signed)
Done erx 

## 2018-06-07 ENCOUNTER — Other Ambulatory Visit: Payer: Self-pay | Admitting: Internal Medicine

## 2018-07-18 ENCOUNTER — Other Ambulatory Visit: Payer: Self-pay | Admitting: Internal Medicine

## 2018-07-19 ENCOUNTER — Other Ambulatory Visit: Payer: Self-pay | Admitting: Internal Medicine

## 2018-07-19 MED ORDER — TRAMADOL HCL 50 MG PO TABS: 50.0000 mg | ORAL_TABLET | Freq: Three times a day (TID) | ORAL | 1 refills | Status: DC | PRN

## 2018-07-19 NOTE — Telephone Encounter (Signed)
Done erx 

## 2018-09-16 ENCOUNTER — Telehealth: Payer: Self-pay | Admitting: Internal Medicine

## 2018-09-16 MED ORDER — FLUOXETINE HCL 40 MG PO CAPS: 40.0000 mg | ORAL_CAPSULE | Freq: Every day | ORAL | 0 refills | Status: DC

## 2018-09-16 NOTE — Telephone Encounter (Signed)
[facility]ldon for prozac refill x 3 mo -   Please let pt know her last OV was June 2019, so will need yearly OV for further refills

## 2018-09-22 ENCOUNTER — Other Ambulatory Visit: Payer: Self-pay | Admitting: Internal Medicine

## 2018-09-22 DIAGNOSIS — E7800 Pure hypercholesterolemia, unspecified: Secondary | ICD-10-CM

## 2018-09-23 ENCOUNTER — Telehealth: Payer: Self-pay | Admitting: Internal Medicine

## 2018-09-23 MED ORDER — CLONAZEPAM 1 MG PO TABS: ORAL_TABLET | ORAL | 0 refills | Status: DC

## 2018-09-23 NOTE — Telephone Encounter (Signed)
Done erx  Please to call pt to make Visit (in person or telehealth) for further refills  Ok even for tonight telehealth if she wants

## 2018-10-27 ENCOUNTER — Telehealth: Payer: Self-pay | Admitting: Internal Medicine

## 2018-10-27 NOTE — Telephone Encounter (Signed)
Ok to not take the zetia for now.  This may have been done last year when the dec 2018 LDL was 194 (severely high) and "statins" are listed as intolerant on her allergy list   OK to continue the crestor if this is tolerable. thanks

## 2018-10-27 NOTE — Telephone Encounter (Signed)
Pt has been informed and expressed understanding.  

## 2018-10-27 NOTE — Telephone Encounter (Signed)
Pt says that she went to the pharmacy to pick up her medications and ezetimibe (ZETIA) 10 MG tablet was also sent in, pt says that she's not familiar with this medication and would like to know why was it called in?    Pt says that she already take a Cholesterol medication.   CB: [phone]

## 2018-11-23 ENCOUNTER — Telehealth: Payer: Self-pay | Admitting: Internal Medicine

## 2018-11-23 MED ORDER — CLONAZEPAM 1 MG PO TABS: ORAL_TABLET | ORAL | 0 refills | Status: DC

## 2018-11-23 NOTE — Telephone Encounter (Signed)
Done erx  Please to contact pt - due for yearly ROV

## 2018-11-23 NOTE — Telephone Encounter (Signed)
Pt has appt on 11/24/18../l,mb

## 2018-11-24 ENCOUNTER — Encounter: Payer: Self-pay | Admitting: Internal Medicine

## 2018-11-24 ENCOUNTER — Other Ambulatory Visit (INDEPENDENT_AMBULATORY_CARE_PROVIDER_SITE_OTHER): Payer: 59

## 2018-11-24 ENCOUNTER — Ambulatory Visit (INDEPENDENT_AMBULATORY_CARE_PROVIDER_SITE_OTHER): Payer: 59 | Admitting: Internal Medicine

## 2018-11-24 ENCOUNTER — Ambulatory Visit (INDEPENDENT_AMBULATORY_CARE_PROVIDER_SITE_OTHER)
Admission: RE | Admit: 2018-11-24 | Discharge: 2018-11-24 | Disposition: A | Discharge status: Home / Self Care | Payer: 59 | Source: Ambulatory Visit | Attending: Internal Medicine | Admitting: Internal Medicine

## 2018-11-24 ENCOUNTER — Other Ambulatory Visit: Payer: Self-pay

## 2018-11-24 VITALS — BP 136/84 | HR 80 | Temp 98.8°F | Ht 62.0 in | Wt 134.0 lb

## 2018-11-24 DIAGNOSIS — Z0001 Encounter for general adult medical examination with abnormal findings: Secondary | ICD-10-CM

## 2018-11-24 DIAGNOSIS — E538 Deficiency of other specified B group vitamins: Secondary | ICD-10-CM

## 2018-11-24 DIAGNOSIS — M545 Low back pain, unspecified: Secondary | ICD-10-CM

## 2018-11-24 DIAGNOSIS — R05 Cough: Secondary | ICD-10-CM

## 2018-11-24 DIAGNOSIS — F411 Generalized anxiety disorder: Secondary | ICD-10-CM | POA: Diagnosis not present

## 2018-11-24 DIAGNOSIS — W57XXXS Bitten or stung by nonvenomous insect and other nonvenomous arthropods, sequela: Secondary | ICD-10-CM | POA: Diagnosis not present

## 2018-11-24 DIAGNOSIS — E611 Iron deficiency: Secondary | ICD-10-CM

## 2018-11-24 DIAGNOSIS — Z20828 Contact with and (suspected) exposure to other viral communicable diseases: Secondary | ICD-10-CM | POA: Diagnosis not present

## 2018-11-24 DIAGNOSIS — Z20822 Contact with and (suspected) exposure to covid-19: Secondary | ICD-10-CM

## 2018-11-24 DIAGNOSIS — E559 Vitamin D deficiency, unspecified: Secondary | ICD-10-CM | POA: Diagnosis not present

## 2018-11-24 DIAGNOSIS — F17200 Nicotine dependence, unspecified, uncomplicated: Secondary | ICD-10-CM

## 2018-11-24 DIAGNOSIS — R059 Cough, unspecified: Secondary | ICD-10-CM

## 2018-11-24 LAB — BASIC METABOLIC PANEL
BUN: 15 mg/dL (ref 6–23)
CO2: 26 mEq/L (ref 19–32)
Calcium: 9.7 mg/dL (ref 8.4–10.5)
Chloride: 101 mEq/L (ref 96–112)
Creatinine, Ser: 1.13 mg/dL (ref 0.40–1.20)
GFR: 48.14 mL/min — ABNORMAL LOW (ref >60.00)
Glucose, Bld: 93 mg/dL (ref 70–99)
Potassium: 4 mEq/L (ref 3.5–5.1)
Sodium: 136 mEq/L (ref 135–145)

## 2018-11-24 LAB — CBC WITH DIFFERENTIAL/PLATELET
Basophils Absolute: 0.1 10*3/uL (ref 0.0–0.1)
Basophils Relative: 0.8 % (ref 0.0–3.0)
Eosinophils Absolute: 0.1 10*3/uL (ref 0.0–0.7)
Eosinophils Relative: 0.9 % (ref 0.0–5.0)
HCT: 43.7 % (ref 36.0–46.0)
Hemoglobin: 14.6 g/dL (ref 12.0–15.0)
Lymphocytes Relative: 29.2 % (ref 12.0–46.0)
Lymphs Abs: 3.2 10*3/uL (ref 0.7–4.0)
MCHC: 33.5 g/dL (ref 30.0–36.0)
MCV: 90.3 fl (ref 78.0–100.0)
Monocytes Absolute: 0.7 10*3/uL (ref 0.1–1.0)
Monocytes Relative: 6.9 % (ref 3.0–12.0)
Neutro Abs: 6.7 10*3/uL (ref 1.4–7.7)
Neutrophils Relative %: 62.2 % (ref 43.0–77.0)
Platelets: 282 10*3/uL (ref 150.0–400.0)
RBC: 4.84 Mil/uL (ref 3.87–5.11)
RDW: 15 % (ref 11.5–15.5)
WBC: 10.8 10*3/uL — ABNORMAL HIGH (ref 4.0–10.5)

## 2018-11-24 LAB — HEPATIC FUNCTION PANEL
ALT: 10 U/L (ref 0–35)
AST: 14 U/L (ref 0–37)
Albumin: 4.6 g/dL (ref 3.5–5.2)
Alkaline Phosphatase: 88 U/L (ref 39–117)
Bilirubin, Direct: 0.1 mg/dL (ref 0.0–0.3)
Total Bilirubin: 0.5 mg/dL (ref 0.2–1.2)
Total Protein: 7.7 g/dL (ref 6.0–8.3)

## 2018-11-24 LAB — IBC PANEL
Iron: 61 ug/dL (ref 42–145)
Saturation Ratios: 15.9 % — ABNORMAL LOW (ref 20.0–50.0)
Transferrin: 274 mg/dL (ref 212.0–360.0)

## 2018-11-24 LAB — TSH: TSH: 2.17 u[IU]/mL (ref 0.35–4.50)

## 2018-11-24 LAB — LIPID PANEL
Cholesterol: 334 mg/dL — ABNORMAL HIGH (ref 0–200)
HDL: 53.1 mg/dL (ref >39.00)
NonHDL: 281.39
Total CHOL/HDL Ratio: 6
Triglycerides: 230 mg/dL — ABNORMAL HIGH (ref 0.0–149.0)
VLDL: 46 mg/dL — ABNORMAL HIGH (ref 0.0–40.0)

## 2018-11-24 LAB — LDL CHOLESTEROL, DIRECT: Direct LDL: 231 mg/dL

## 2018-11-24 MED ORDER — CYCLOBENZAPRINE HCL 5 MG PO TABS: 5.0000 mg | ORAL_TABLET | Freq: Three times a day (TID) | ORAL | 1 refills | Status: DC | PRN

## 2018-11-24 MED ORDER — BUPROPION HCL ER (XL) 300 MG PO TB24: 300.0000 mg | ORAL_TABLET | Freq: Every day | ORAL | 3 refills | Status: DC

## 2018-11-24 NOTE — Progress Notes (Signed)
Subjective:    Patient ID: [name], female    DOB: [birth year], 68 y.o.   MRN: [id number]  HPI  Here for wellness and f/u;  Overall doing ok;  Pt denies Chest pain, worsening SOB, DOE, wheezing, orthopnea, PND, worsening LE edema, palpitations, dizziness or syncope.  Pt denies neurological change such as new headache, facial or extremity weakness.  Pt denies polydipsia, polyuria, or low sugar symptoms. Pt states overall good compliance with treatment and medications, good tolerability, and has been trying to follow appropriate diet. . No fever, night sweats, wt loss, loss of appetite, or other constitutional symptoms.  Pt states good ability with ADL's, has low fall risk, home safety reviewed and adequate, no other significant changes in hearing or vision, and only occasionally active with exercise. Also c/o increased stress - overworked with covid pandemic months, doing other persons work.  Asks for restart wellbutrin due to anxiety and stress, cannot really say why she stopped it, but asking to restart.  Also c/o increased cough x 2-3 months somewhat productive, white, no blood, no pain, no fever, still smokes, something around 1 ppd for 40 yrs overall.  Also with 3 recent tick bites, inflamed now improved, concerned about lyme due to joint pains.  Also with muscular spasms recurring to just below the right scapula and left post back worse with coughing.  Denies urinary symptoms such as dysuria, frequency, urgency, flank pain, hematuria or n/v, fever, chills.  Denies worsening reflux, abd pain, dysphagia, n/v, bowel change or blood. Past Medical History:  Diagnosis Date  . ANXIETY 12/01/2006  . Arthritis   . Blepharospasm 12/01/2006  . BONE SPUR 12/01/2006  . Cellulitis and abscess of oral soft tissues 05/01/2008  . Cervicalgia 08/24/2008  . COLD SORE 05/30/2008  . DEPRESSION 12/01/2006  . EXCISION OF GANGLION CYST, WRIST, HX OF 12/01/2006  . GERD 12/01/2006  . HYPERLIPIDEMIA 12/01/2006  . HYPERTENSION,  BORDERLINE 12/01/2006  . LOW BACK PAIN 12/01/2006  . MACULAR DEGENERATION 12/01/2006  . OTITIS MEDIA, ACUTE, LEFT 05/30/2008  . PARESTHESIA 08/24/2008  . SINUSITIS- ACUTE-NOS 01/07/2010   Past Surgical History:  Procedure Laterality Date  . ABDOMINAL HYSTERECTOMY    . blepharospasm    . bone spur     removal right foot  . BREAST LUMPECTOMY     right  . GANGLION CYST EXCISION    . H/O Macular degeneration      reports that she has been smoking. She has never used smokeless tobacco. She reports that she does not drink alcohol or use drugs. family history includes Diabetes in her daughter; Heart attack in her father; Heart disease in her father; Hypertension in her mother; Pulmonary fibrosis in her mother. Allergies  Allergen Reactions  . Codeine   . Hydrocodone-Acetaminophen   . Loratadine-Pseudoephedrine Er   . Statins Other (See Comments)    Joint and muscle cramp   Current Outpatient Medications on File Prior to Visit  Medication Sig Dispense Refill  . aspirin 81 MG tablet Take 81 mg by mouth daily.      . clonazePAM (KLONOPIN) 1 MG tablet 1/2 - 1 tab by mouth twice per day as needed 60 tablet 0  . ezetimibe (ZETIA) 10 MG tablet TAKE 1 TABLET(10 MG) BY MOUTH DAILY 30 tablet 0  . FLUoxetine (PROZAC) 40 MG capsule Take 1 capsule (40 mg total) by mouth daily. 90 capsule 0  . gabapentin (NEURONTIN) 100 MG capsule Take 1 capsule (100 mg total) by mouth 3 (  three) times daily. 90 capsule 3  . ibuprofen (ADVIL,MOTRIN) 800 MG tablet Take 1 tablet (800 mg total) by mouth every 8 (eight) hours as needed. 40 tablet 1  . losartan (COZAAR) 50 MG tablet TAKE 1 TABLET BY MOUTH ONCE DAILY 30 tablet 5  . NONFORMULARY OR COMPOUNDED ITEM Testosterone Cream 2% 15 Gram dispenser S:  Apply clitorally 9/3-7/9 gram (1-2 clicks from dispenser) 1 each 11  . ondansetron (ZOFRAN) 4 MG tablet Take 1 tablet (4 mg total) by mouth every 8 (eight) hours as needed for nausea or vomiting. 20 tablet 0  . rosuvastatin  (CRESTOR) 40 MG tablet TAKE 1 TABLET BY MOUTH 2 TIMES EVERY WEEK 24 tablet 2  . testosterone (ANDROGEL) 50 MG/5GM (1%) GEL Place 5 g onto the skin daily. 30 Tube 5  . traMADol (ULTRAM) 50 MG tablet Take 1 tablet (50 mg total) by mouth every 8 (eight) hours as needed. 40 tablet 1  . valACYclovir (VALTREX) 1000 MG tablet TAKE 1 TABLET BY MOUTH TWICE DAILY 120 tablet 0  . Vitamin D, Ergocalciferol, (DRISDOL) 50000 units CAPS capsule TAKE 1 CAPSULE BY MOUTH EVERY 7 DAYS 12 capsule 0   No current facility-administered medications on file prior to visit.    Review of Systems Constitutional: Negative for other unusual diaphoresis, sweats, appetite or weight changes HENT: Negative for other worsening hearing loss, ear pain, facial swelling, mouth sores or neck stiffness.   Eyes: Negative for other worsening pain, redness or other visual disturbance.  Respiratory: Negative for other stridor or swelling Cardiovascular: Negative for other palpitations or other chest pain  Gastrointestinal: Negative for worsening diarrhea or loose stools, blood in stool, distention or other pain Genitourinary: Negative for hematuria, flank pain or other change in urine volume.  Musculoskeletal: Negative for myalgias or other joint swelling.  Skin: Negative for other color change, or other wound or worsening drainage.  Neurological: Negative for other syncope or numbness. Hematological: Negative for other adenopathy or swelling Psychiatric/Behavioral: Negative for hallucinations, other worsening agitation, SI, self-injury, or new decreased concentration All other system neg per pt    Objective:   Physical Exam BP 136/84   Pulse 80   Temp 98.8 F (37.1 C) (Oral)   Ht 5\' 2"  (1.575 m)   Wt 134 lb (60.8 kg)   SpO2 96%   BMI 24.51 kg/m  VS noted, not ill appearing Constitutional: Pt appears in NAD HENT: Head: NCAT.  Right Ear: External ear normal.  Left Ear: External ear normal.  Eyes: . Pupils are equal, round,  and reactive to light. Conjunctivae and EOM are normal Nose: without d/c or deformity Neck: Neck supple. Gross normal ROM Cardiovascular: Normal rate and regular rhythm.   Pulmonary/Chest: Effort normal and breath sounds without rales or wheezing.  Abd:  Soft, NT, ND, + BS, no organomegaly Neurological: Pt is alert. At baseline orientation, motor grossly intact Skin: Skin is warm. No rashes, other new lesions, no LE edema Psychiatric: Pt behavior is normal without agitation, 2+ nervous  No other exam findings  Lab Results  Component Value Date   WBC 6.3 04/09/2017   HGB 13.7 04/09/2017   HCT 40.8 04/09/2017   PLT 239.0 04/09/2017   GLUCOSE 85 04/09/2017   CHOL 260 (H) 04/09/2017   TRIG 233.0 (H) 04/09/2017   HDL 42.30 04/09/2017   LDLDIRECT 194.0 04/09/2017   ALT 9 04/09/2017   AST 16 04/09/2017   NA 138 04/09/2017   K 4.3 04/09/2017   CL 103 04/09/2017  CREATININE 1.24 (H) 04/09/2017   BUN 15 04/09/2017   CO2 28 04/09/2017   TSH 2.71 04/09/2017        Assessment & Plan:

## 2018-11-24 NOTE — Patient Instructions (Signed)
Please take all new medication as prescribed - the muscle relaxer as needed  Please continue all other medications as before, including the refill of the wellbutrin  Please have the pharmacy call with any other refills you may need.  Please continue your efforts at being more active, low cholesterol diet, and weight control.  You are otherwise up to date with prevention measures today.  Please keep your appointments with your specialists as you may have planned  Please go to the XRAY Department in the Basement (go straight as you get off the elevator) for the x-ray testing  /Please go to the LAB in the Basement (turn left off the elevator) for the tests to be done today  You will be contacted by phone if any changes need to be made immediately.  Otherwise, you will receive a letter about your results with an explanation, but please check with MyChart first.  Please remember to sign up for MyChart if you have not done so, as this will be important to you in the future with finding out test results, communicating by private email, and scheduling acute appointments online when needed.  Please return in 1 year for your yearly visit, or sooner if needed

## 2018-11-24 NOTE — Assessment & Plan Note (Signed)
Reports URI symptoms in Feb 2020 - asks for covid serology testing,  to f/u any worsening symptoms or concerns

## 2018-11-24 NOTE — Assessment & Plan Note (Signed)
Moderate worse, no Si or HI, for restart wellbutrin

## 2018-11-24 NOTE — Assessment & Plan Note (Signed)

## 2018-11-24 NOTE — Assessment & Plan Note (Signed)
Ok for muscle relaxer prn,  to f/u any worsening symptoms or concerns 

## 2018-11-24 NOTE — Assessment & Plan Note (Signed)
Urged referral to pulm for LDCT yearly screening but declines

## 2018-11-24 NOTE — Assessment & Plan Note (Signed)
[place] for lyme serology

## 2018-11-24 NOTE — Assessment & Plan Note (Addendum)
Etiology unclear, for cxr  In addition to the time spent performing CPE, I spent an additional 40 minutes face to face,in which greater than 50% of this time was spent in counseling and coordination of care for patient's acute illness as documented, including the differential dx, treatment, further evaluation and other management of cough, smoker, exposure to covid19, tick bite infections, anxiety, back pain

## 2018-11-25 ENCOUNTER — Encounter: Payer: Self-pay | Admitting: Internal Medicine

## 2018-11-25 ENCOUNTER — Telehealth: Payer: Self-pay

## 2018-11-25 LAB — VITAMIN D 25 HYDROXY (VIT D DEFICIENCY, FRACTURES): VITD: 30.53 ng/mL (ref 30.00–100.00)

## 2018-11-25 LAB — URINALYSIS, ROUTINE W REFLEX MICROSCOPIC
Bilirubin Urine: NEGATIVE
Hgb urine dipstick: NEGATIVE
Ketones, ur: NEGATIVE
Leukocytes,Ua: NEGATIVE
Nitrite: NEGATIVE
RBC / HPF: NONE SEEN (ref 0–?)
Specific Gravity, Urine: 1.015 (ref 1.000–1.030)
Total Protein, Urine: NEGATIVE
Urine Glucose: NEGATIVE
Urobilinogen, UA: 0.2 (ref 0.0–1.0)
pH: 5.5 (ref 5.0–8.0)

## 2018-11-25 LAB — VITAMIN B12: Vitamin B-12: 185 pg/mL — ABNORMAL LOW (ref 211–911)

## 2018-11-25 NOTE — Telephone Encounter (Signed)
-----   Message from Biagio Borg, MD sent at 11/25/2018  8:12 AM EDT ----- Left message on MyChart, pt to cont same tx except  The test results show that your current treatment is OK, as the tests are stable, except the LDL cholesterol is high, and the Vitamin B12 is low.  Please follow a lower cholesterol diet, and also the office should call to help arrange Vitamin B12 shots for at least 6 months, after which pills can most likely be taken.[name] to please inform pt, and help arrange monthly b12 shots

## 2018-11-26 ENCOUNTER — Ambulatory Visit (INDEPENDENT_AMBULATORY_CARE_PROVIDER_SITE_OTHER): Payer: 59

## 2018-11-26 ENCOUNTER — Encounter: Payer: Self-pay | Admitting: Internal Medicine

## 2018-11-26 DIAGNOSIS — E538 Deficiency of other specified B group vitamins: Secondary | ICD-10-CM

## 2018-11-26 MED ORDER — CYANOCOBALAMIN 1000 MCG/ML IJ SOLN
1000.0000 ug | Freq: Once | INTRAMUSCULAR | Status: AC
  Administered 2018-11-26: 1000 ug via INTRAMUSCULAR

## 2018-11-26 MED ORDER — OMEPRAZOLE 40 MG PO CPDR: 40.0000 mg | DELAYED_RELEASE_CAPSULE | Freq: Every day | ORAL | 3 refills | Status: DC

## 2018-11-26 NOTE — Progress Notes (Signed)
Medical screening examination/treatment/procedure(s) were performed by non-physician practitioner and as supervising physician I was immediately available for consultation/collaboration. I agree with above. Beyonce Sawatzky, MD   

## 2018-12-03 ENCOUNTER — Encounter: Payer: Self-pay | Admitting: Internal Medicine

## 2018-12-03 DIAGNOSIS — W57XXXD Bitten or stung by nonvenomous insect and other nonvenomous arthropods, subsequent encounter: Secondary | ICD-10-CM

## 2018-12-13 ENCOUNTER — Other Ambulatory Visit: Payer: 59

## 2018-12-13 DIAGNOSIS — W57XXXD Bitten or stung by nonvenomous insect and other nonvenomous arthropods, subsequent encounter: Secondary | ICD-10-CM

## 2018-12-14 LAB — B. BURGDORFI ANTIBODIES: B burgdorferi Ab IgG+IgM: <0.9 index

## 2018-12-17 NOTE — Telephone Encounter (Signed)
err

## 2018-12-28 ENCOUNTER — Ambulatory Visit: Payer: 59 | Admitting: Internal Medicine

## 2018-12-30 ENCOUNTER — Ambulatory Visit: Payer: 59

## 2019-01-05 ENCOUNTER — Ambulatory Visit (INDEPENDENT_AMBULATORY_CARE_PROVIDER_SITE_OTHER): Payer: 59

## 2019-01-05 DIAGNOSIS — E538 Deficiency of other specified B group vitamins: Secondary | ICD-10-CM

## 2019-01-05 MED ORDER — CYANOCOBALAMIN 1000 MCG/ML IJ SOLN
1000.0000 ug | Freq: Once | INTRAMUSCULAR | Status: AC
  Administered 2019-01-05: 1000 ug via INTRAMUSCULAR

## 2019-01-05 NOTE — Progress Notes (Signed)
Medical screening examination/treatment/procedure(s) were performed by non-physician practitioner and as supervising physician I was immediately available for consultation/collaboration. I agree with above. Kamaljit Hizer, MD   

## 2019-01-12 ENCOUNTER — Encounter: Payer: Self-pay | Admitting: Internal Medicine

## 2019-01-12 ENCOUNTER — Other Ambulatory Visit: Payer: Self-pay | Admitting: Internal Medicine

## 2019-01-12 MED ORDER — FLUOXETINE HCL 40 MG PO CAPS: 40.0000 mg | ORAL_CAPSULE | Freq: Every day | ORAL | 0 refills | Status: DC

## 2019-01-12 MED ORDER — VALACYCLOVIR HCL 1 G PO TABS: 1000.0000 mg | ORAL_TABLET | Freq: Two times a day (BID) | ORAL | 0 refills | Status: DC

## 2019-01-17 ENCOUNTER — Telehealth: Payer: Self-pay | Admitting: Internal Medicine

## 2019-01-17 MED ORDER — CLONAZEPAM 1 MG PO TABS: ORAL_TABLET | ORAL | 5 refills | Status: DC

## 2019-01-17 NOTE — Telephone Encounter (Signed)
Done erx 

## 2019-02-04 ENCOUNTER — Ambulatory Visit (INDEPENDENT_AMBULATORY_CARE_PROVIDER_SITE_OTHER): Payer: Self-pay

## 2019-02-04 DIAGNOSIS — E538 Deficiency of other specified B group vitamins: Secondary | ICD-10-CM

## 2019-02-04 MED ORDER — CYANOCOBALAMIN 1000 MCG/ML IJ SOLN
1000.0000 ug | Freq: Once | INTRAMUSCULAR | Status: AC
  Administered 2019-02-04: 1000 ug via INTRAMUSCULAR

## 2019-02-04 NOTE — Progress Notes (Signed)
b12 Injection given.   [name] J Khalin Royce, MD  

## 2019-02-07 ENCOUNTER — Telehealth: Payer: Self-pay | Admitting: Emergency Medicine

## 2019-02-07 NOTE — Telephone Encounter (Signed)
Pt states she has had a cough xs 1 mo, no other symptoms related to Covid, denies fever, has temp checked every day at work. States she does has hx of allergies.   Appt has been scheduled for tomorrow, as pt did not want to see anyone but Dr Jenny Reichmann. Are you okay with this being an in office visit?

## 2019-02-07 NOTE — Telephone Encounter (Signed)
Yes, thanks

## 2019-02-08 ENCOUNTER — Ambulatory Visit (INDEPENDENT_AMBULATORY_CARE_PROVIDER_SITE_OTHER): Payer: 59 | Admitting: Internal Medicine

## 2019-02-08 DIAGNOSIS — R059 Cough, unspecified: Secondary | ICD-10-CM

## 2019-02-08 DIAGNOSIS — R05 Cough: Secondary | ICD-10-CM

## 2019-02-08 MED ORDER — TRIAMCINOLONE ACETONIDE 55 MCG/ACT NA AERO: 2.0000 | INHALATION_SPRAY | Freq: Every day | NASAL | 12 refills | Status: DC

## 2019-02-08 MED ORDER — PREDNISONE 10 MG PO TABS: ORAL_TABLET | ORAL | 0 refills | Status: DC

## 2019-02-08 NOTE — Patient Instructions (Signed)
Please take all new medication as prescribed - the nasacort and the prednisone  Please continue all other medications as before, including the mucinex  Please have the pharmacy call with any other refills you may need.  Please continue your efforts at being more active, low cholesterol diet, and weight control.  Please keep your appointments with your specialists as you may have planned

## 2019-02-08 NOTE — Progress Notes (Signed)
Patient ID: [name], female   DOB: [birth year], 68 y.o.   MRN: [id number]  Phone visit  Cumulative time during 7-day interval 14 min, there was not an associated office visit for this concern within a 7 day period.  Verbal consent for services obtained from patient prior to services given.  Names of all persons present for services: Cathlean Cower, MD, patient  Chief complaint: cough, wheezing  History, background, results pertinent:  Here with c/o 2 days onset mild cough, feeling off balance, ears full and blocked up, not sure about fever but denies ST.  Pt denies chest pain, orthopnea, PND, increased LE swelling, palpitations, dizziness or syncope, but with mild sob/doe/wheezing since yesterday.  cxr July 2020 neg.  Does also have several wks ongoing nasal allergy symptoms with clearish congestion, itch and sneezing, without fever, pain, ST, cough, swelling or wheezing.  Past Medical History:  Diagnosis Date  . ANXIETY 12/01/2006  . Arthritis   . Blepharospasm 12/01/2006  . BONE SPUR 12/01/2006  . Cellulitis and abscess of oral soft tissues 05/01/2008  . Cervicalgia 08/24/2008  . COLD SORE 05/30/2008  . DEPRESSION 12/01/2006  . EXCISION OF GANGLION CYST, WRIST, HX OF 12/01/2006  . GERD 12/01/2006  . HYPERLIPIDEMIA 12/01/2006  . HYPERTENSION, BORDERLINE 12/01/2006  . LOW BACK PAIN 12/01/2006  . MACULAR DEGENERATION 12/01/2006  . OTITIS MEDIA, ACUTE, LEFT 05/30/2008  . PARESTHESIA 08/24/2008  . SINUSITIS- ACUTE-NOS 01/07/2010   No results found for this or any previous visit (from the past 48 hour(s)). Lab Results  Component Value Date   WBC 10.8 (H) 11/24/2018   HGB 14.6 11/24/2018   HCT 43.7 11/24/2018   PLT 282.0 11/24/2018   GLUCOSE 93 11/24/2018   CHOL 334 (H) 11/24/2018   TRIG 230.0 (H) 11/24/2018   HDL 53.10 11/24/2018   LDLDIRECT 231.0 11/24/2018   ALT 10 11/24/2018   AST 14 11/24/2018   NA 136 11/24/2018   K 4.0 11/24/2018   CL 101 11/24/2018   CREATININE 1.13 11/24/2018   BUN 15  11/24/2018   CO2 26 11/24/2018   TSH 2.17 11/24/2018   A/P/next steps:   Cough  - Mild to mod, most likely due to allergies and post nasal gtt, declines repeat cxr, for antibx course,  to f/u any worsening symptoms or concerns  Allergic rhinitis - for nasacort asd,  to f/u any worsening symptoms or concerns  Wheezing - likely bronchospasm, for predpac asd, cont all other tx  Cathlean Cower MD

## 2019-02-08 NOTE — Telephone Encounter (Signed)
Per Dr Jenny Reichmann, this visit was approved to be IN OFFICE.

## 2019-02-08 NOTE — Telephone Encounter (Signed)
Pt was set up for a phone visit yesterday.

## 2019-02-13 ENCOUNTER — Encounter: Payer: Self-pay | Admitting: Internal Medicine

## 2019-02-13 NOTE — Assessment & Plan Note (Signed)
See notes

## 2019-03-07 ENCOUNTER — Encounter: Payer: Self-pay | Admitting: Internal Medicine

## 2019-03-07 ENCOUNTER — Other Ambulatory Visit: Payer: Self-pay

## 2019-03-07 ENCOUNTER — Ambulatory Visit (INDEPENDENT_AMBULATORY_CARE_PROVIDER_SITE_OTHER): Payer: PRIVATE HEALTH INSURANCE

## 2019-03-07 DIAGNOSIS — E538 Deficiency of other specified B group vitamins: Secondary | ICD-10-CM | POA: Diagnosis not present

## 2019-03-07 MED ORDER — LOSARTAN POTASSIUM 50 MG PO TABS: 50.0000 mg | ORAL_TABLET | Freq: Every day | ORAL | 5 refills | Status: DC

## 2019-03-07 MED ORDER — CYANOCOBALAMIN 1000 MCG/ML IJ SOLN
1000.0000 ug | Freq: Once | INTRAMUSCULAR | Status: AC
  Administered 2019-03-07: 1000 ug via INTRAMUSCULAR

## 2019-03-07 MED ORDER — ROSUVASTATIN CALCIUM 40 MG PO TABS: ORAL_TABLET | ORAL | 2 refills | Status: DC

## 2019-03-07 NOTE — Progress Notes (Signed)
Medical screening examination/treatment/procedure(s) were performed by non-physician practitioner and as supervising physician I was immediately available for consultation/collaboration. I agree with above. Seville Brick, MD   

## 2019-03-08 MED ORDER — FAMOTIDINE 20 MG PO TABS: 20.0000 mg | ORAL_TABLET | Freq: Two times a day (BID) | ORAL | 3 refills | Status: DC

## 2019-04-06 ENCOUNTER — Ambulatory Visit (INDEPENDENT_AMBULATORY_CARE_PROVIDER_SITE_OTHER): Payer: PRIVATE HEALTH INSURANCE | Admitting: *Deleted

## 2019-04-06 ENCOUNTER — Other Ambulatory Visit: Payer: Self-pay

## 2019-04-06 DIAGNOSIS — E538 Deficiency of other specified B group vitamins: Secondary | ICD-10-CM | POA: Diagnosis not present

## 2019-04-06 MED ORDER — CYANOCOBALAMIN 1000 MCG/ML IJ SOLN
1000.0000 ug | Freq: Once | INTRAMUSCULAR | Status: AC
  Administered 2019-04-06: 1000 ug via INTRAMUSCULAR

## 2019-04-06 NOTE — Progress Notes (Signed)
Medical screening examination/treatment/procedure(s) were performed by non-physician practitioner and as supervising physician I was immediately available for consultation/collaboration. I agree with above. Mart Colpitts, MD   

## 2019-04-25 ENCOUNTER — Other Ambulatory Visit: Payer: Self-pay | Admitting: Internal Medicine

## 2019-04-25 MED ORDER — FLUOXETINE HCL 40 MG PO CAPS: 40.0000 mg | ORAL_CAPSULE | Freq: Every day | ORAL | 0 refills | Status: DC

## 2019-07-26 ENCOUNTER — Encounter: Payer: Self-pay | Admitting: Internal Medicine

## 2019-07-26 MED ORDER — CYCLOBENZAPRINE HCL 5 MG PO TABS: 5.0000 mg | ORAL_TABLET | Freq: Three times a day (TID) | ORAL | 1 refills | Status: DC | PRN

## 2019-07-26 MED ORDER — LOSARTAN POTASSIUM 50 MG PO TABS: 50.0000 mg | ORAL_TABLET | Freq: Every day | ORAL | 4 refills | Status: DC

## 2019-08-05 ENCOUNTER — Encounter: Payer: Self-pay | Admitting: Internal Medicine

## 2019-08-05 MED ORDER — CLONAZEPAM 1 MG PO TABS: ORAL_TABLET | ORAL | 5 refills | Status: DC

## 2019-08-05 NOTE — Telephone Encounter (Signed)
Done erx 

## 2019-08-09 ENCOUNTER — Other Ambulatory Visit: Payer: Self-pay | Admitting: Internal Medicine

## 2019-12-11 ENCOUNTER — Other Ambulatory Visit: Payer: Self-pay | Admitting: Internal Medicine

## 2019-12-11 NOTE — Telephone Encounter (Signed)
Please refill as per office routine med refill policy (all routine meds refilled for 3 mo or monthly per pt preference up to one year from last visit, then month to month grace period for 3 mo, then further med refills will have to be denied)  

## 2020-03-16 ENCOUNTER — Telehealth: Payer: Self-pay | Admitting: Internal Medicine

## 2020-03-16 MED ORDER — CLONAZEPAM 1 MG PO TABS: ORAL_TABLET | ORAL | 0 refills | Status: DC

## 2020-03-16 NOTE — Telephone Encounter (Signed)
1.Medication Requested:clonazePAM (KLONOPIN) 1 MG tablet  2. Pharmacy (Name, [facility]):  [facility] [phone] - [place], [place] - [address] AT [address] Phone:  [phone]  Fax:  [phone]       3. On Med List: Y  4. Last Visit with PCP: 02/08/19  5. Next visit date with PCP: 05/07/2020   Agent: Please be advised that RX refills may take up to 3 business days. We ask that you follow-up with your pharmacy.

## 2020-03-16 NOTE — Telephone Encounter (Signed)
Sent to Dr. John. 

## 2020-03-16 NOTE — Telephone Encounter (Signed)
Klonopin done erx x 1 mo  please to let pt know - needs rov for further refills

## 2020-03-28 ENCOUNTER — Ambulatory Visit: Payer: PRIVATE HEALTH INSURANCE | Admitting: Internal Medicine

## 2020-03-28 DIAGNOSIS — Z0289 Encounter for other administrative examinations: Secondary | ICD-10-CM

## 2020-05-01 ENCOUNTER — Other Ambulatory Visit: Payer: Self-pay | Admitting: Internal Medicine

## 2020-05-01 NOTE — Telephone Encounter (Signed)
Ok to let pt know  Klonopin done 1 mo only  Needs ROV for further refills

## 2020-05-07 ENCOUNTER — Ambulatory Visit (INDEPENDENT_AMBULATORY_CARE_PROVIDER_SITE_OTHER): Payer: Medicare Other | Admitting: Internal Medicine

## 2020-05-07 ENCOUNTER — Other Ambulatory Visit: Payer: Self-pay

## 2020-05-07 ENCOUNTER — Encounter: Payer: Self-pay | Admitting: Internal Medicine

## 2020-05-07 VITALS — BP 140/76 | HR 45 | Temp 98.1°F | Ht 62.0 in | Wt 129.0 lb

## 2020-05-07 DIAGNOSIS — N1831 Chronic kidney disease, stage 3a: Secondary | ICD-10-CM | POA: Diagnosis not present

## 2020-05-07 DIAGNOSIS — R011 Cardiac murmur, unspecified: Secondary | ICD-10-CM | POA: Diagnosis not present

## 2020-05-07 DIAGNOSIS — Z0000 Encounter for general adult medical examination without abnormal findings: Secondary | ICD-10-CM | POA: Diagnosis not present

## 2020-05-07 DIAGNOSIS — E538 Deficiency of other specified B group vitamins: Secondary | ICD-10-CM | POA: Diagnosis not present

## 2020-05-07 DIAGNOSIS — E782 Mixed hyperlipidemia: Secondary | ICD-10-CM

## 2020-05-07 DIAGNOSIS — Z23 Encounter for immunization: Secondary | ICD-10-CM

## 2020-05-07 DIAGNOSIS — F17200 Nicotine dependence, unspecified, uncomplicated: Secondary | ICD-10-CM | POA: Diagnosis not present

## 2020-05-07 DIAGNOSIS — N183 Chronic kidney disease, stage 3 unspecified: Secondary | ICD-10-CM | POA: Insufficient documentation

## 2020-05-07 DIAGNOSIS — I10 Essential (primary) hypertension: Secondary | ICD-10-CM

## 2020-05-07 DIAGNOSIS — F411 Generalized anxiety disorder: Secondary | ICD-10-CM | POA: Diagnosis not present

## 2020-05-07 DIAGNOSIS — F32A Depression, unspecified: Secondary | ICD-10-CM | POA: Diagnosis not present

## 2020-05-07 DIAGNOSIS — R001 Bradycardia, unspecified: Secondary | ICD-10-CM

## 2020-05-07 DIAGNOSIS — E7800 Pure hypercholesterolemia, unspecified: Secondary | ICD-10-CM

## 2020-05-07 DIAGNOSIS — E559 Vitamin D deficiency, unspecified: Secondary | ICD-10-CM | POA: Diagnosis not present

## 2020-05-07 DIAGNOSIS — Z0001 Encounter for general adult medical examination with abnormal findings: Secondary | ICD-10-CM

## 2020-05-07 LAB — URINALYSIS, ROUTINE W REFLEX MICROSCOPIC
Bilirubin Urine: NEGATIVE
Hgb urine dipstick: NEGATIVE
Ketones, ur: NEGATIVE
Leukocytes,Ua: NEGATIVE
Nitrite: NEGATIVE
RBC / HPF: NONE SEEN (ref 0–?)
Specific Gravity, Urine: <=1.005 — AB (ref 1.000–1.030)
Total Protein, Urine: NEGATIVE
Urine Glucose: NEGATIVE
Urobilinogen, UA: 0.2 (ref 0.0–1.0)
WBC, UA: NONE SEEN (ref 0–?)
pH: 6.5 (ref 5.0–8.0)

## 2020-05-07 LAB — VITAMIN B12: Vitamin B-12: 416 pg/mL (ref 211–911)

## 2020-05-07 LAB — LDL CHOLESTEROL, DIRECT: Direct LDL: 195 mg/dL

## 2020-05-07 LAB — BASIC METABOLIC PANEL
BUN: 10 mg/dL (ref 6–23)
CO2: 29 mEq/L (ref 19–32)
Calcium: 10.3 mg/dL (ref 8.4–10.5)
Chloride: 104 mEq/L (ref 96–112)
Creatinine, Ser: 1.07 mg/dL (ref 0.40–1.20)
GFR: 53.69 mL/min — ABNORMAL LOW (ref >60.00)
Glucose, Bld: 77 mg/dL (ref 70–99)
Potassium: 4 mEq/L (ref 3.5–5.1)
Sodium: 139 mEq/L (ref 135–145)

## 2020-05-07 LAB — CBC WITH DIFFERENTIAL/PLATELET
Basophils Absolute: 0 10*3/uL (ref 0.0–0.1)
Basophils Relative: 0.3 % (ref 0.0–3.0)
Eosinophils Absolute: 0.3 10*3/uL (ref 0.0–0.7)
Eosinophils Relative: 2.4 % (ref 0.0–5.0)
HCT: 44.1 % (ref 36.0–46.0)
Hemoglobin: 14.5 g/dL (ref 12.0–15.0)
Lymphocytes Relative: 26.5 % (ref 12.0–46.0)
Lymphs Abs: 3 10*3/uL (ref 0.7–4.0)
MCHC: 33 g/dL (ref 30.0–36.0)
MCV: 89.1 fl (ref 78.0–100.0)
Monocytes Absolute: 0.7 10*3/uL (ref 0.1–1.0)
Monocytes Relative: 5.9 % (ref 3.0–12.0)
Neutro Abs: 7.3 10*3/uL (ref 1.4–7.7)
Neutrophils Relative %: 64.9 % (ref 43.0–77.0)
Platelets: 269 10*3/uL (ref 150.0–400.0)
RBC: 4.95 Mil/uL (ref 3.87–5.11)
RDW: 14.3 % (ref 11.5–15.5)
WBC: 11.3 10*3/uL — ABNORMAL HIGH (ref 4.0–10.5)

## 2020-05-07 LAB — TSH: TSH: 1.73 u[IU]/mL (ref 0.35–4.50)

## 2020-05-07 LAB — VITAMIN D 25 HYDROXY (VIT D DEFICIENCY, FRACTURES): VITD: 35.48 ng/mL (ref 30.00–100.00)

## 2020-05-07 LAB — HEPATIC FUNCTION PANEL
ALT: 7 U/L (ref 0–35)
AST: 12 U/L (ref 0–37)
Albumin: 4.6 g/dL (ref 3.5–5.2)
Alkaline Phosphatase: 72 U/L (ref 39–117)
Bilirubin, Direct: 0.1 mg/dL (ref 0.0–0.3)
Total Bilirubin: 0.5 mg/dL (ref 0.2–1.2)
Total Protein: 7.5 g/dL (ref 6.0–8.3)

## 2020-05-07 LAB — PHOSPHORUS: Phosphorus: 3.9 mg/dL (ref 2.3–4.6)

## 2020-05-07 MED ORDER — EZETIMIBE 10 MG PO TABS: ORAL_TABLET | ORAL | 3 refills | Status: DC

## 2020-05-07 MED ORDER — BUPROPION HCL ER (XL) 300 MG PO TB24: 300.0000 mg | ORAL_TABLET | Freq: Every day | ORAL | 3 refills | Status: DC

## 2020-05-07 MED ORDER — ROSUVASTATIN CALCIUM 40 MG PO TABS: ORAL_TABLET | ORAL | 3 refills | Status: DC

## 2020-05-07 MED ORDER — FAMOTIDINE 20 MG PO TABS: 20.0000 mg | ORAL_TABLET | Freq: Two times a day (BID) | ORAL | 3 refills | Status: DC

## 2020-05-07 MED ORDER — GABAPENTIN 100 MG PO CAPS
100.0000 mg | ORAL_CAPSULE | Freq: Three times a day (TID) | ORAL | 5 refills | Status: DC
Start: 2020-05-07 — End: 2021-06-07

## 2020-05-07 MED ORDER — CLONAZEPAM 1 MG PO TABS: ORAL_TABLET | ORAL | 5 refills | Status: DC

## 2020-05-07 MED ORDER — FLUOXETINE HCL 40 MG PO CAPS: 40.0000 mg | ORAL_CAPSULE | Freq: Every day | ORAL | 0 refills | Status: DC

## 2020-05-07 MED ORDER — LOSARTAN POTASSIUM 50 MG PO TABS: ORAL_TABLET | ORAL | 3 refills | Status: DC

## 2020-05-07 NOTE — Assessment & Plan Note (Signed)
Severe, likely genetic component, for statin restart, consider refer lipid clinic

## 2020-05-07 NOTE — Progress Notes (Signed)
Established Patient Office Visit  Subjective:  Patient ID: [name], female    DOB: June 05, 1950  Age: 69 y.o. MRN: [id number]       Chief Complaint:: wellness exam and Annual Exam (CPE/labs.  Not fasting today.  Wants flu shot today. )       HPI:  [name] is a 70 y.o. female here for wellness exam   Out of meds since jul 2020y as no insurnace and made things last until now. Last mammo x 2 yrs, but plans to f/u with this with GYN.  Unfortunately now with marked increased depressive anxiety symptoms, husband very supportive, denies HI or SI.  ALso noted is initial HR 45 at intake?  No prior hx of this or heart murmurs.  Also has new low Vit D and B12 by labs. Pt denies chest pain, increased sob or doe, wheezing, orthopnea, PND, increased LE swelling, palpitations, dizziness or syncope.  Pt denies new neurological symptoms such as new headache, or facial or extremity weakness or numbness   Pt denies polydipsia, polyuria,  Pt states overall good compliance with meds, trying to follow lower cholesterol diet, wt overall stable but little exercise however.     Wt Readings from Last 3 Encounters:  05/07/20 129 lb (58.5 kg)  11/24/18 134 lb (60.8 kg)  10/23/17 125 lb (56.7 kg)   BP Readings from Last 3 Encounters:  05/07/20 140/76  11/24/18 136/84  10/23/17 (!) 142/86    Past Medical History:  Diagnosis Date  . ANXIETY 12/01/2006  . Arthritis   . Blepharospasm 12/01/2006  . BONE SPUR 12/01/2006  . Cellulitis and abscess of oral soft tissues 05/01/2008  . Cervicalgia 08/24/2008  . COLD SORE 05/30/2008  . DEPRESSION 12/01/2006  . EXCISION OF GANGLION CYST, WRIST, HX OF 12/01/2006  . GERD 12/01/2006  . HYPERLIPIDEMIA 12/01/2006  . HYPERTENSION, BORDERLINE 12/01/2006  . LOW BACK PAIN 12/01/2006  . MACULAR DEGENERATION 12/01/2006  . OTITIS MEDIA, ACUTE, LEFT 05/30/2008  . PARESTHESIA 08/24/2008  . SINUSITIS- ACUTE-NOS 01/07/2010   Past Surgical History:  Procedure Laterality Date  . ABDOMINAL HYSTERECTOMY     . blepharospasm    . bone spur     removal right foot  . BREAST LUMPECTOMY     right  . GANGLION CYST EXCISION    . H/O Macular degeneration      reports that she has been smoking. She has never used smokeless tobacco. She reports that she does not drink alcohol and does not use drugs. family history includes Diabetes in her daughter; Heart attack in her father; Heart disease in her father; Hypertension in her mother; Pulmonary fibrosis in her mother. Allergies  Allergen Reactions  . Codeine   . Hydrocodone-Acetaminophen   . Loratadine-Pseudoephedrine Er   . Statins Other (See Comments)    Joint and muscle cramp   Current Outpatient Medications on File Prior to Visit  Medication Sig Dispense Refill  . aspirin 81 MG tablet Take 81 mg by mouth daily.    Marland Kitchen ibuprofen (ADVIL,MOTRIN) 800 MG tablet Take 1 tablet (800 mg total) by mouth every 8 (eight) hours as needed. 40 tablet 1  . cyclobenzaprine (FLEXERIL) 5 MG tablet Take 1 tablet (5 mg total) by mouth 3 (three) times daily as needed for muscle spasms. 60 tablet 1  . NONFORMULARY OR COMPOUNDED ITEM Testosterone Cream 2% 15 Gram dispenser S:  Apply clitorally 6/1-6/0 gram (1-2 clicks from dispenser) (Patient not taking: Reported on 05/07/2020) 1 each  11  . ondansetron (ZOFRAN) 4 MG tablet Take 1 tablet (4 mg total) by mouth every 8 (eight) hours as needed for nausea or vomiting. (Patient not taking: Reported on 05/07/2020) 20 tablet 0  . triamcinolone (NASACORT) 55 MCG/ACT AERO nasal inhaler Place 2 sprays into the nose daily. (Patient not taking: Reported on 05/07/2020) 1 Inhaler 12  . valACYclovir (VALTREX) 1000 MG tablet Take 1 tablet (1,000 mg total) by mouth 2 (two) times daily. (Patient not taking: Reported on 05/07/2020) 120 tablet 0   No current facility-administered medications on file prior to visit.        ROS:  All others reviewed and negative.  Objective        PE:  BP 140/76   Pulse (!) 45   Temp 98.1 F (36.7 C) (Oral)    Ht 5\' 2"  (1.575 m)   Wt 129 lb (58.5 kg)   SpO2 98%   BMI 23.59 kg/m                 Constitutional: Pt appears in NAD               HENT: Head: NCAT.                Right Ear: External ear normal.                 Left Ear: External ear normal.                Eyes: . Pupils are equal, round, and reactive to light. Conjunctivae and EOM are normal               Nose: without d/c or deformity               Neck: Neck supple. Gross normal ROM               Cardiovascular: Normal rate and regular rhythm.  With gr 2/6 sys murmur RUSB ? Somewhat irregular               Pulmonary/Chest: Effort normal and breath sounds without rales or wheezing.                Abd:  Soft, NT, ND, + BS, no organomegaly               Neurological: Pt is alert. At baseline orientation, motor grossly intact               Skin: Skin is warm. No rashes, no other new lesions, LE edema - none               Psychiatric: Pt behavior is normal without agitation   Assessment/Plan:  [name] is a 70 y.o. White or Caucasian [1] female with  has a past medical history of ANXIETY (12/01/2006), Arthritis, Blepharospasm (12/01/2006), BONE SPUR (12/01/2006), Cellulitis and abscess of oral soft tissues (05/01/2008), Cervicalgia (08/24/2008), COLD SORE (05/30/2008), DEPRESSION (12/01/2006), EXCISION OF GANGLION CYST, WRIST, HX OF (12/01/2006), GERD (12/01/2006), HYPERLIPIDEMIA (12/01/2006), HYPERTENSION, BORDERLINE (12/01/2006), LOW BACK PAIN (12/01/2006), MACULAR DEGENERATION (12/01/2006), OTITIS MEDIA, ACUTE, LEFT (05/30/2008), PARESTHESIA (08/24/2008), and SINUSITIS- ACUTE-NOS (01/07/2010).  Assessment Plan  See notes Labs reviewed for each problem: Lab Results  Component Value Date   WBC 11.3 (H) 05/07/2020   HGB 14.5 05/07/2020   HCT 44.1 05/07/2020   PLT 269.0 05/07/2020   GLUCOSE 77 05/07/2020   CHOL 289 (H) 05/07/2020   TRIG 245.0 (H) 05/07/2020   HDL  53.50 05/07/2020   LDLDIRECT 195.0 05/07/2020   ALT 7 05/07/2020   AST 12 05/07/2020    NA 139 05/07/2020   K 4.0 05/07/2020   CL 104 05/07/2020   CREATININE 1.07 05/07/2020   BUN 10 05/07/2020   CO2 29 05/07/2020   TSH 1.73 05/07/2020    Micro: none  Cardiac tracings I have personally interpreted today:  ECG - SR with PAC at 69  Pertinent Radiological findings (summarize): none    There are no preventive care reminders to display for this patient.  There are no preventive care reminders to display for this patient.  Lab Results  Component Value Date   TSH 1.73 05/07/2020   Lab Results  Component Value Date   WBC 11.3 (H) 05/07/2020   HGB 14.5 05/07/2020   HCT 44.1 05/07/2020   MCV 89.1 05/07/2020   PLT 269.0 05/07/2020   Lab Results  Component Value Date   NA 139 05/07/2020   K 4.0 05/07/2020   CO2 29 05/07/2020   GLUCOSE 77 05/07/2020   BUN 10 05/07/2020   CREATININE 1.07 05/07/2020   BILITOT 0.5 05/07/2020   ALKPHOS 72 05/07/2020   AST 12 05/07/2020   ALT 7 05/07/2020   PROT 7.5 05/07/2020   ALBUMIN 4.6 05/07/2020   CALCIUM CANCELED 05/07/2020   CALCIUM 10.3 05/07/2020   GFR 53.69 (L) 05/07/2020   Lab Results  Component Value Date   CHOL 289 (H) 05/07/2020   Lab Results  Component Value Date   HDL 53.50 05/07/2020   No results found for: [facility] Lab Results  Component Value Date   TRIG 245.0 (H) 05/07/2020   Lab Results  Component Value Date   CHOLHDL 5 05/07/2020   No results found for: HGBA1C    Assessment & Plan:   Problem List Items Addressed This Visit      High   Encounter for well adult exam with abnormal findings    Overall doing well, age appropriate education and counseling updated, referrals for preventative services and immunizations addressed, dietary and smoking counseling addressed, most recent labs reviewed.  I have personally reviewed and have noted:  1) the patient's medical and social history 2) The pt's use of alcohol, tobacco, and illicit drugs 3) The patient's current medications and supplements 4)  Functional ability including ADL's, fall risk, home safety risk, hearing and visual impairment 5) Diet and physical activities 6) Evidence for depression or mood disorder 7) The patient's height, weight, and BMI have been recorded in the chart  I have made referrals, and provided counseling and education based on review of the above         Medium   Vitamin D deficiency    Last vitamin D Lab Results  Component Value Date   VD25OH 35.48 05/07/2020   Stable, start oral replacement vit d3 2000 u qd      Relevant Orders   VITAMIN D 25 Hydroxy (Vit-D Deficiency, Fractures) (Completed)   Smoker    Counseled to quit, declines chantix      Hyperlipidemia    Severe, likely genetic component, for statin restart, consider refer lipid clinic      Relevant Medications   ezetimibe (ZETIA) 10 MG tablet   losartan (COZAAR) 50 MG tablet   rosuvastatin (CRESTOR) 40 MG tablet   Other Relevant Orders   Lipid panel (Completed)   Hepatic function panel (Completed)   CBC with Differential/Platelet (Completed)   TSH (Completed)   Urinalysis, Routine w reflex microscopic (  Completed)   Basic metabolic panel (Completed)   Heart murmur    New finging, etiology unclear, for echo      Relevant Orders   ECHOCARDIOGRAM COMPLETE   Essential hypertension    BP Readings from Last 3 Encounters:  05/07/20 140/76  11/24/18 136/84  10/23/17 (!) 142/86   Stable, pt to restart losartn 50   Current Outpatient Medications (Cardiovascular):  .  ezetimibe (ZETIA) 10 MG tablet, TAKE 1 TABLET(10 MG) BY MOUTH DAILY .  losartan (COZAAR) 50 MG tablet, TAKE 1 TABLET(50 MG) BY MOUTH DAILY .  rosuvastatin (CRESTOR) 40 MG tablet, TAKE 1 TABLET BY MOUTH 2 TIMES EVERY WEEK  Current Outpatient Medications (Respiratory):  .  triamcinolone (NASACORT) 55 MCG/ACT AERO nasal inhaler, Place 2 sprays into the nose daily. (Patient not taking: Reported on 05/07/2020)  Current Outpatient Medications (Analgesics):  .   aspirin 81 MG tablet, Take 81 mg by mouth daily. Marland Kitchen  ibuprofen (ADVIL,MOTRIN) 800 MG tablet, Take 1 tablet (800 mg total) by mouth every 8 (eight) hours as needed.   Current Outpatient Medications (Other):  Marland Kitchen  buPROPion (WELLBUTRIN XL) 300 MG 24 hr tablet, Take 1 tablet (300 mg total) by mouth daily. .  clonazePAM (KLONOPIN) 1 MG tablet, TAKE 1/2 TO 1 TABLET BY MOUTH TWICE DAILY AS NEEDED .  cyclobenzaprine (FLEXERIL) 5 MG tablet, Take 1 tablet (5 mg total) by mouth 3 (three) times daily as needed for muscle spasms. .  famotidine (PEPCID) 20 MG tablet, Take 1 tablet (20 mg total) by mouth 2 (two) times daily. Marland Kitchen  FLUoxetine (PROZAC) 40 MG capsule, Take 1 capsule (40 mg total) by mouth daily. Annual appt due in [place] must see provider for future refills .  gabapentin (NEURONTIN) 100 MG capsule, Take 1 capsule (100 mg total) by mouth 3 (three) times daily. .  NONFORMULARY OR COMPOUNDED ITEM, Testosterone Cream 2% 15 Gram dispenser S:  Apply clitorally 99991111 gram (1-2 clicks from dispenser) (Patient not taking: Reported on 05/07/2020) .  ondansetron (ZOFRAN) 4 MG tablet, Take 1 tablet (4 mg total) by mouth every 8 (eight) hours as needed for nausea or vomiting. (Patient not taking: Reported on 05/07/2020) .  valACYclovir (VALTREX) 1000 MG tablet, Take 1 tablet (1,000 mg total) by mouth 2 (two) times daily. (Patient not taking: Reported on 05/07/2020)       Relevant Medications   ezetimibe (ZETIA) 10 MG tablet   losartan (COZAAR) 50 MG tablet   rosuvastatin (CRESTOR) 40 MG tablet   Depression    No SI or HI, declines behavioral health referral, for prozac 40 restart      Relevant Medications   buPROPion (WELLBUTRIN XL) 300 MG 24 hr tablet   FLUoxetine (PROZAC) 40 MG capsule   CKD (chronic kidney disease) stage 3, GFR 30-59 ml/min (HCC)    Lab Results  Component Value Date   CREATININE 1.07 05/07/2020   Stable overall, cont to avoid nephrotoxins       Relevant Orders   PTH, intact and  calcium (Completed)   Phosphorus (Completed)   Bradycardia    Noted in initial VS, but suspect automatic VS in error due to PACs on ECG, ok to follow      Relevant Orders   ECHOCARDIOGRAM COMPLETE   B12 deficiency    Lab Results  Component Value Date   VITAMINB12 416 05/07/2020   Stable, start oral replacement - b12 1000 mcg qd       Relevant Orders   Vitamin B12 (Completed)  Anxiety state    Markedly worsening out of prozac for 6 mo- for restart      Relevant Medications   buPROPion (WELLBUTRIN XL) 300 MG 24 hr tablet   FLUoxetine (PROZAC) 40 MG capsule    Other Visit Diagnoses    Need for influenza vaccination    -  Primary   Relevant Orders   Flu Vaccine QUAD High Dose(Fluad) (Completed)   Hypercholesterolemia       Relevant Medications   ezetimibe (ZETIA) 10 MG tablet   losartan (COZAAR) 50 MG tablet   rosuvastatin (CRESTOR) 40 MG tablet      Meds ordered this encounter  Medications  . buPROPion (WELLBUTRIN XL) 300 MG 24 hr tablet    Sig: Take 1 tablet (300 mg total) by mouth daily.    Dispense:  90 tablet    Refill:  3  . clonazePAM (KLONOPIN) 1 MG tablet    Sig: TAKE 1/2 TO 1 TABLET BY MOUTH TWICE DAILY AS NEEDED    Dispense:  60 tablet    Refill:  5  . ezetimibe (ZETIA) 10 MG tablet    Sig: TAKE 1 TABLET(10 MG) BY MOUTH DAILY    Dispense:  90 tablet    Refill:  3    APPOINTMENT NEEDED FOR ADDITIONAL REFILLS  . famotidine (PEPCID) 20 MG tablet    Sig: Take 1 tablet (20 mg total) by mouth 2 (two) times daily.    Dispense:  180 tablet    Refill:  3  . FLUoxetine (PROZAC) 40 MG capsule    Sig: Take 1 capsule (40 mg total) by mouth daily. Annual appt due in [place] must see provider for future refills    Dispense:  90 capsule    Refill:  0  . gabapentin (NEURONTIN) 100 MG capsule    Sig: Take 1 capsule (100 mg total) by mouth 3 (three) times daily.    Dispense:  90 capsule    Refill:  5  . losartan (COZAAR) 50 MG tablet    Sig: TAKE 1 TABLET(50 MG)  BY MOUTH DAILY    Dispense:  90 tablet    Refill:  3    Pt has to see PCP for any further rx refills.  . rosuvastatin (CRESTOR) 40 MG tablet    Sig: TAKE 1 TABLET BY MOUTH 2 TIMES EVERY WEEK    Dispense:  24 tablet    Refill:  3    Follow-up: Return in about 6 months (around 11/04/2020).   Cathlean Cower, MD 05/07/2020 11:54 AM [facility]

## 2020-05-07 NOTE — Patient Instructions (Signed)
You had the flu shot today  Your EKG was OK today  Please continue all other medications as before, and refills have been done if requested  Please have the pharmacy call with any other refills you may need.  Please continue your efforts at being more active, low cholesterol diet, and weight control.  You are otherwise up to date with prevention measures today.  Please keep your appointments with your specialists as you may have planned  You will be contacted regarding the referral for: echocardiogram  Please go to the LAB at the blood drawing area for the tests to be done  You will be contacted by phone if any changes need to be made immediately.  Otherwise, you will receive a letter about your results with an explanation, but please check with MyChart first.  Please remember to sign up for MyChart if you have not done so, as this will be important to you in the future with finding out test results, communicating by private email, and scheduling acute appointments online when needed.  Please make an Appointment to return in 6 months, or sooner if needed

## 2020-05-08 ENCOUNTER — Encounter: Payer: Self-pay | Admitting: Internal Medicine

## 2020-05-08 LAB — LIPID PANEL
Cholesterol: 289 mg/dL — ABNORMAL HIGH (ref 0–200)
HDL: 53.5 mg/dL (ref >39.00)
NonHDL: 235.28
Total CHOL/HDL Ratio: 5
Triglycerides: 245 mg/dL — ABNORMAL HIGH (ref 0.0–149.0)
VLDL: 49 mg/dL — ABNORMAL HIGH (ref 0.0–40.0)

## 2020-05-09 LAB — PTH, INTACT AND CALCIUM: PTH: 28 pg/mL (ref 14–64)

## 2020-05-10 ENCOUNTER — Encounter: Payer: Self-pay | Admitting: Internal Medicine

## 2020-05-14 ENCOUNTER — Encounter: Payer: Self-pay | Admitting: Internal Medicine

## 2020-05-14 NOTE — Assessment & Plan Note (Signed)
Lab Results  Component Value Date   CREATININE 1.07 05/07/2020   Stable overall, cont to avoid nephrotoxins

## 2020-05-14 NOTE — Assessment & Plan Note (Signed)
New finging, etiology unclear, for echo

## 2020-05-14 NOTE — Assessment & Plan Note (Signed)
Last vitamin D Lab Results  Component Value Date   VD25OH 35.48 05/07/2020   Stable, start oral replacement vit d3 2000 u qd

## 2020-05-14 NOTE — Assessment & Plan Note (Signed)
Lab Results  Component Value Date   VITAMINB12 416 05/07/2020   Stable, start oral replacement - b12 1000 mcg qd

## 2020-05-14 NOTE — Assessment & Plan Note (Signed)
No SI or HI, declines behavioral health referral, for prozac 40 restart

## 2020-05-14 NOTE — Assessment & Plan Note (Signed)

## 2020-05-14 NOTE — Assessment & Plan Note (Addendum)
Counseled to quit, declines chantix 

## 2020-05-14 NOTE — Assessment & Plan Note (Signed)
Noted in initial VS, but suspect automatic VS in error due to PACs on ECG, ok to follow

## 2020-05-14 NOTE — Assessment & Plan Note (Signed)
BP Readings from Last 3 Encounters:  05/07/20 140/76  11/24/18 136/84  10/23/17 (!) 142/86   Stable, pt to restart losartn 50   Current Outpatient Medications (Cardiovascular):  .  ezetimibe (ZETIA) 10 MG tablet, TAKE 1 TABLET(10 MG) BY MOUTH DAILY .  losartan (COZAAR) 50 MG tablet, TAKE 1 TABLET(50 MG) BY MOUTH DAILY .  rosuvastatin (CRESTOR) 40 MG tablet, TAKE 1 TABLET BY MOUTH 2 TIMES EVERY WEEK  Current Outpatient Medications (Respiratory):  .  triamcinolone (NASACORT) 55 MCG/ACT AERO nasal inhaler, Place 2 sprays into the nose daily. (Patient not taking: Reported on 05/07/2020)  Current Outpatient Medications (Analgesics):  .  aspirin 81 MG tablet, Take 81 mg by mouth daily. Marland Kitchen  ibuprofen (ADVIL,MOTRIN) 800 MG tablet, Take 1 tablet (800 mg total) by mouth every 8 (eight) hours as needed.   Current Outpatient Medications (Other):  Marland Kitchen  buPROPion (WELLBUTRIN XL) 300 MG 24 hr tablet, Take 1 tablet (300 mg total) by mouth daily. .  clonazePAM (KLONOPIN) 1 MG tablet, TAKE 1/2 TO 1 TABLET BY MOUTH TWICE DAILY AS NEEDED .  cyclobenzaprine (FLEXERIL) 5 MG tablet, Take 1 tablet (5 mg total) by mouth 3 (three) times daily as needed for muscle spasms. .  famotidine (PEPCID) 20 MG tablet, Take 1 tablet (20 mg total) by mouth 2 (two) times daily. Marland Kitchen  FLUoxetine (PROZAC) 40 MG capsule, Take 1 capsule (40 mg total) by mouth daily. Annual appt due in [place] must see provider for future refills .  gabapentin (NEURONTIN) 100 MG capsule, Take 1 capsule (100 mg total) by mouth 3 (three) times daily. .  NONFORMULARY OR COMPOUNDED ITEM, Testosterone Cream 2% 15 Gram dispenser S:  Apply clitorally 4/4-9/6 gram (1-2 clicks from dispenser) (Patient not taking: Reported on 05/07/2020) .  ondansetron (ZOFRAN) 4 MG tablet, Take 1 tablet (4 mg total) by mouth every 8 (eight) hours as needed for nausea or vomiting. (Patient not taking: Reported on 05/07/2020) .  valACYclovir (VALTREX) 1000 MG tablet, Take 1 tablet  (1,000 mg total) by mouth 2 (two) times daily. (Patient not taking: Reported on 05/07/2020)

## 2020-05-14 NOTE — Assessment & Plan Note (Signed)
Markedly worsening out of prozac for 6 mo- for restart

## 2020-05-29 ENCOUNTER — Encounter: Payer: Self-pay | Admitting: Internal Medicine

## 2020-05-29 ENCOUNTER — Ambulatory Visit (HOSPITAL_COMMUNITY): Payer: Medicare Other | Attending: Internal Medicine

## 2020-05-29 ENCOUNTER — Other Ambulatory Visit: Payer: Self-pay

## 2020-05-29 DIAGNOSIS — R011 Cardiac murmur, unspecified: Secondary | ICD-10-CM | POA: Diagnosis not present

## 2020-05-29 DIAGNOSIS — R001 Bradycardia, unspecified: Secondary | ICD-10-CM | POA: Diagnosis not present

## 2020-05-29 LAB — ECHOCARDIOGRAM COMPLETE
Area-P 1/2: 1.79 cm2
P 1/2 time: 352 msec
S' Lateral: 2.8 cm

## 2020-08-30 ENCOUNTER — Other Ambulatory Visit: Payer: Self-pay | Admitting: Internal Medicine

## 2020-12-03 ENCOUNTER — Encounter: Payer: Self-pay | Admitting: Internal Medicine

## 2020-12-04 ENCOUNTER — Telehealth: Payer: Self-pay | Admitting: Internal Medicine

## 2020-12-04 MED ORDER — CLONAZEPAM 1 MG PO TABS: ORAL_TABLET | ORAL | 4 refills | Status: DC

## 2020-12-04 MED ORDER — FAMOTIDINE 20 MG PO TABS: 20.0000 mg | ORAL_TABLET | Freq: Two times a day (BID) | ORAL | 1 refills | Status: DC

## 2020-12-04 MED ORDER — CYCLOBENZAPRINE HCL 5 MG PO TABS: ORAL_TABLET | ORAL | 1 refills | Status: DC

## 2020-12-04 NOTE — Telephone Encounter (Signed)
1.Medication Requested: famotidine (PEPCID) 20 MG tablet  clonazePAM (KLONOPIN) 1 MG tablet  cyclobenzaprine (FLEXERIL) 5 MG tablet   2. Pharmacy (Name, [facility]): [id number] - [place], [address] AT [facility] [address]  Phone:  [phone]   3. On Med List: yes  4. Last Visit with PCP: 01.10.22  5. Next visit date with PCP: n/a   Agent: Please be advised that RX refills may take up to 3 business days. We ask that you follow-up with your pharmacy.

## 2020-12-05 NOTE — Telephone Encounter (Signed)
Already done last PM - see emr

## 2021-03-02 ENCOUNTER — Other Ambulatory Visit: Payer: Self-pay | Admitting: Internal Medicine

## 2021-03-02 NOTE — Telephone Encounter (Signed)
Please refill as per office routine med refill policy (all routine meds to be refilled for 3 mo or monthly (per pt preference) up to one year from last visit, then month to month grace period for 3 mo, then further med refills will have to be denied) ? ?

## 2021-05-21 ENCOUNTER — Encounter: Payer: Medicare Other | Admitting: Internal Medicine

## 2021-06-06 ENCOUNTER — Other Ambulatory Visit: Payer: Self-pay

## 2021-06-06 ENCOUNTER — Ambulatory Visit (INDEPENDENT_AMBULATORY_CARE_PROVIDER_SITE_OTHER): Payer: Medicare Other | Admitting: Internal Medicine

## 2021-06-06 VITALS — BP 140/80 | HR 87 | Temp 98.2°F | Ht 63.0 in | Wt 120.4 lb

## 2021-06-06 DIAGNOSIS — Z0001 Encounter for general adult medical examination with abnormal findings: Secondary | ICD-10-CM

## 2021-06-06 DIAGNOSIS — R739 Hyperglycemia, unspecified: Secondary | ICD-10-CM

## 2021-06-06 DIAGNOSIS — E538 Deficiency of other specified B group vitamins: Secondary | ICD-10-CM

## 2021-06-06 DIAGNOSIS — Z1211 Encounter for screening for malignant neoplasm of colon: Secondary | ICD-10-CM | POA: Diagnosis not present

## 2021-06-06 DIAGNOSIS — N1831 Chronic kidney disease, stage 3a: Secondary | ICD-10-CM | POA: Diagnosis not present

## 2021-06-06 DIAGNOSIS — H6121 Impacted cerumen, right ear: Secondary | ICD-10-CM

## 2021-06-06 DIAGNOSIS — Z23 Encounter for immunization: Secondary | ICD-10-CM | POA: Diagnosis not present

## 2021-06-06 DIAGNOSIS — I10 Essential (primary) hypertension: Secondary | ICD-10-CM

## 2021-06-06 DIAGNOSIS — E782 Mixed hyperlipidemia: Secondary | ICD-10-CM

## 2021-06-06 DIAGNOSIS — E559 Vitamin D deficiency, unspecified: Secondary | ICD-10-CM

## 2021-06-06 DIAGNOSIS — F32A Depression, unspecified: Secondary | ICD-10-CM | POA: Diagnosis not present

## 2021-06-06 LAB — CBC WITH DIFFERENTIAL/PLATELET
Basophils Absolute: 0.1 10*3/uL (ref 0.0–0.1)
Basophils Relative: 0.5 % (ref 0.0–3.0)
Eosinophils Absolute: 0.1 10*3/uL (ref 0.0–0.7)
Eosinophils Relative: 0.6 % (ref 0.0–5.0)
HCT: 42.5 % (ref 36.0–46.0)
Hemoglobin: 13.9 g/dL (ref 12.0–15.0)
Lymphocytes Relative: 15.1 % (ref 12.0–46.0)
Lymphs Abs: 2.1 10*3/uL (ref 0.7–4.0)
MCHC: 32.7 g/dL (ref 30.0–36.0)
MCV: 86.1 fl (ref 78.0–100.0)
Monocytes Absolute: 0.7 10*3/uL (ref 0.1–1.0)
Monocytes Relative: 4.9 % (ref 3.0–12.0)
Neutro Abs: 11.1 10*3/uL — ABNORMAL HIGH (ref 1.4–7.7)
Neutrophils Relative %: 78.9 % — ABNORMAL HIGH (ref 43.0–77.0)
Platelets: 237 10*3/uL (ref 150.0–400.0)
RBC: 4.94 Mil/uL (ref 3.87–5.11)
RDW: 14.1 % (ref 11.5–15.5)
WBC: 14.1 10*3/uL — ABNORMAL HIGH (ref 4.0–10.5)

## 2021-06-06 LAB — LIPID PANEL
Cholesterol: 189 mg/dL (ref 0–200)
HDL: 50.4 mg/dL (ref >39.00)
NonHDL: 138.14
Total CHOL/HDL Ratio: 4
Triglycerides: 241 mg/dL — ABNORMAL HIGH (ref 0.0–149.0)
VLDL: 48.2 mg/dL — ABNORMAL HIGH (ref 0.0–40.0)

## 2021-06-06 LAB — URINALYSIS, ROUTINE W REFLEX MICROSCOPIC
Bilirubin Urine: NEGATIVE
Hgb urine dipstick: NEGATIVE
Ketones, ur: NEGATIVE
Leukocytes,Ua: NEGATIVE
Nitrite: NEGATIVE
RBC / HPF: NONE SEEN (ref 0–?)
Specific Gravity, Urine: <=1.005 — AB (ref 1.000–1.030)
Total Protein, Urine: NEGATIVE
Urine Glucose: NEGATIVE
Urobilinogen, UA: 0.2 (ref 0.0–1.0)
WBC, UA: NONE SEEN (ref 0–?)
pH: 6 (ref 5.0–8.0)

## 2021-06-06 LAB — HEPATIC FUNCTION PANEL
ALT: 10 U/L (ref 0–35)
AST: 15 U/L (ref 0–37)
Albumin: 4.3 g/dL (ref 3.5–5.2)
Alkaline Phosphatase: 90 U/L (ref 39–117)
Bilirubin, Direct: 0 mg/dL (ref 0.0–0.3)
Total Bilirubin: 0.4 mg/dL (ref 0.2–1.2)
Total Protein: 7.4 g/dL (ref 6.0–8.3)

## 2021-06-06 LAB — BASIC METABOLIC PANEL
BUN: 10 mg/dL (ref 6–23)
CO2: 31 mEq/L (ref 19–32)
Calcium: 9.7 mg/dL (ref 8.4–10.5)
Chloride: 101 mEq/L (ref 96–112)
Creatinine, Ser: 1.18 mg/dL (ref 0.40–1.20)
GFR: 47.38 mL/min — ABNORMAL LOW (ref >60.00)
Glucose, Bld: 85 mg/dL (ref 70–99)
Potassium: 4 mEq/L (ref 3.5–5.1)
Sodium: 139 mEq/L (ref 135–145)

## 2021-06-06 LAB — HEMOGLOBIN A1C: Hgb A1c MFr Bld: 5.7 % (ref 4.6–6.5)

## 2021-06-06 LAB — TSH: TSH: 2.27 u[IU]/mL (ref 0.35–5.50)

## 2021-06-06 LAB — LDL CHOLESTEROL, DIRECT: Direct LDL: 106 mg/dL

## 2021-06-06 LAB — VITAMIN D 25 HYDROXY (VIT D DEFICIENCY, FRACTURES): VITD: 26.98 ng/mL — ABNORMAL LOW (ref 30.00–100.00)

## 2021-06-06 LAB — VITAMIN B12: Vitamin B-12: 251 pg/mL (ref 211–911)

## 2021-06-06 MED ORDER — ROSUVASTATIN CALCIUM 40 MG PO TABS: ORAL_TABLET | ORAL | 3 refills | Status: DC

## 2021-06-06 MED ORDER — CYCLOBENZAPRINE HCL 5 MG PO TABS: ORAL_TABLET | ORAL | 1 refills | Status: DC

## 2021-06-06 MED ORDER — VALACYCLOVIR HCL 1 G PO TABS: 1000.0000 mg | ORAL_TABLET | Freq: Two times a day (BID) | ORAL | 5 refills | Status: AC

## 2021-06-06 MED ORDER — LOSARTAN POTASSIUM 100 MG PO TABS: 100.0000 mg | ORAL_TABLET | Freq: Every day | ORAL | 3 refills | Status: DC

## 2021-06-06 NOTE — Progress Notes (Signed)
Patient ID: [name], female   DOB: July 23, 1950, 71 y.o.   MRN: [id number]         Chief Complaint:: wellness exam and allergies, smoking, hld, low vit d, ckd, htn       HPI:  [name] is a 71 y.o. female here for wellness exam; due for colonoscopy, declines covid booster, shingrix, but ok for flu shot, prevnar 20 but decines mammogram, dxa, tdap o/w up to date                Also only taking wellbutrin, not taking prozac for now, Denies worsening depressive symptoms, suicidal ideation, or panic; has ongoing anxiety  Does have several wks ongoing nasal allergy symptoms with clearish congestion, itch and sneezing, without fever, pain, ST, cough, swelling or wheezing.  Still smoking, not ready to quit.  Trying to follow lower chol diet, cannot tolerate statin but interested in card Ct score and lipid clinic.  Not taking Vit D.  BP has been mild elevated at home as well as today.  Pt denies chest pain, increased sob or doe, wheezing, orthopnea, PND, increased LE swelling, palpitations, dizziness or syncope.   Pt denies polydipsia, polyuria, or new focal neuro s/s.    Pt denies fever, wt loss, night sweats, loss of appetite, or other constitutional symptoms  Also has worsening right hearing loss, likley wax impactions Wt Readings from Last 3 Encounters:  06/06/21 120 lb 6 oz (54.6 kg)  05/07/20 129 lb (58.5 kg)  11/24/18 134 lb (60.8 kg)   BP Readings from Last 3 Encounters:  06/06/21 140/80  05/07/20 140/76  11/24/18 136/84   Immunization History  Administered Date(s) Administered   Fluad Quad(high Dose 65+) 05/07/2020, 06/06/2021   Influenza Nasal 01/19/2016   Influenza-Unspecified 01/26/2014, 02/02/2015, 03/08/2018   PNEUMOCOCCAL CONJUGATE-20 06/06/2021   Td 08/23/2009   Health Maintenance Due  Topic Date Due   COLONOSCOPY (Pts 45-46yrs Insurance coverage will need to be confirmed)  Never done      Past Medical History:  Diagnosis Date   ANXIETY 12/01/2006   Arthritis     Blepharospasm 12/01/2006   BONE SPUR 12/01/2006   Cellulitis and abscess of oral soft tissues 05/01/2008   Cervicalgia 08/24/2008   COLD SORE 05/30/2008   DEPRESSION 12/01/2006   EXCISION OF GANGLION CYST, WRIST, HX OF 12/01/2006   GERD 12/01/2006   HYPERLIPIDEMIA 12/01/2006   HYPERTENSION, BORDERLINE 12/01/2006   LOW BACK PAIN 12/01/2006   MACULAR DEGENERATION 12/01/2006   OTITIS MEDIA, ACUTE, LEFT 05/30/2008   PARESTHESIA 08/24/2008   SINUSITIS- ACUTE-NOS 01/07/2010   Past Surgical History:  Procedure Laterality Date   ABDOMINAL HYSTERECTOMY     blepharospasm     bone spur     removal right foot   BREAST LUMPECTOMY     right   GANGLION CYST EXCISION     H/O Macular degeneration      reports that she has been smoking. She has never used smokeless tobacco. She reports that she does not drink alcohol and does not use drugs. family history includes Diabetes in her daughter; Heart attack in her father; Heart disease in her father; Hypertension in her mother; Pulmonary fibrosis in her mother. Allergies  Allergen Reactions   Codeine    Hydrocodone-Acetaminophen    Loratadine-Pseudoephedrine Er    Statins Other (See Comments)    Joint and muscle cramp   Current Outpatient Medications on File Prior to Visit  Medication Sig Dispense Refill   aspirin 81  MG tablet Take 81 mg by mouth daily.     No current facility-administered medications on file prior to visit.        ROS:  All others reviewed and negative.  Objective        PE:  BP 140/80    Pulse 87    Temp 98.2 F (36.8 C)    Ht 5\' 3"  (1.6 m)    Wt 120 lb 6 oz (54.6 kg)    SpO2 99%    BMI 21.32 kg/m                 Constitutional: Pt appears in NAD               HENT: Head: NCAT.                Right Ear: External ear normal.  Right wax impaction resolved with irrigation and hearing improved               Left Ear: External ear normal.                Eyes: . Pupils are equal, round, and reactive to light. Conjunctivae and EOM are normal                Nose: without d/c or deformity               Neck: Neck supple. Gross normal ROM               Cardiovascular: Normal rate and regular rhythm.                 Pulmonary/Chest: Effort normal and breath sounds without rales or wheezing.                Abd:  Soft, NT, ND, + BS, no organomegaly               Neurological: Pt is alert. At baseline orientation, motor grossly intact               Skin: Skin is warm. No rashes, no other new lesions, LE edema - none               Psychiatric: Pt behavior is normal without agitation , depressed affect  Micro: none  Cardiac tracings I have personally interpreted today:  none  Pertinent Radiological findings (summarize): none   Lab Results  Component Value Date   WBC 14.1 (H) 06/06/2021   HGB 13.9 06/06/2021   HCT 42.5 06/06/2021   PLT 237.0 06/06/2021   GLUCOSE 85 06/06/2021   CHOL 189 06/06/2021   TRIG 241.0 (H) 06/06/2021   HDL 50.40 06/06/2021   LDLDIRECT 106.0 06/06/2021   ALT 10 06/06/2021   AST 15 06/06/2021   NA 139 06/06/2021   K 4.0 06/06/2021   CL 101 06/06/2021   CREATININE 1.18 06/06/2021   BUN 10 06/06/2021   CO2 31 06/06/2021   TSH 2.27 06/06/2021   HGBA1C 5.7 06/06/2021   Assessment/Plan:  [name] is a 71 y.o. White or Caucasian [1] female with  has a past medical history of ANXIETY (12/01/2006), Arthritis, Blepharospasm (12/01/2006), BONE SPUR (12/01/2006), Cellulitis and abscess of oral soft tissues (05/01/2008), Cervicalgia (08/24/2008), COLD SORE (05/30/2008), DEPRESSION (12/01/2006), EXCISION OF GANGLION CYST, WRIST, HX OF (12/01/2006), GERD (12/01/2006), HYPERLIPIDEMIA (12/01/2006), HYPERTENSION, BORDERLINE (12/01/2006), LOW BACK PAIN (12/01/2006), MACULAR DEGENERATION (12/01/2006), OTITIS MEDIA, ACUTE, LEFT (05/30/2008), PARESTHESIA (08/24/2008), and SINUSITIS- ACUTE-NOS (01/07/2010).  B12 deficiency  Lab Results  Component Value Date   CHENIDPO24 235 06/06/2021   Low, to start oral replacement - b12 1000 mcg qd  CKD (chronic  kidney disease) stage 3, GFR 30-59 ml/min (HCC) Lab Results  Component Value Date   CREATININE 1.18 06/06/2021   Stable overall, cont to avoid nephrotoxins  Depression I suspect mild worsening but decliens restart prozac  Essential hypertension BP Readings from Last 3 Encounters:  06/06/21 140/80  05/07/20 140/76  11/24/18 136/84   Mild uncontrolled, for incresaed losartan 100 qd,   Hyperlipidemia  Severe uncontrolled with LDL > 190 and goal ldl < 100, pt to continue current crestor, zetia and refer lipid clinic, for CT card score   Vitamin D deficiency Last vitamin D Lab Results  Component Value Date   VD25OH 26.98 (L) 06/06/2021   Low, to start oral replacement   Encounter for well adult exam with abnormal findings Age and sex appropriate education and counseling updated with regular exercise and diet Referrals for preventative services - for colonoscopy, but declines mammogram, dxa Immunizations addressed - for flu shot, prevnar 20 but declines covid booster, shingrix, tdap Smoking counseling  - none needed Evidence for depression or other mood disorder - chronic depression provably worsening  - declines restart SSRI Most recent labs reviewed. I have personally reviewed and have noted: 1) the patient's medical and social history 2) The patient's current medications and supplements 3) The patient's height, weight, and BMI have been recorded in the chart   Impacted cerumen of right ear Resolved and hearing improved with irrigation  Ceruminosis is noted.  Wax is removed by syringing and manual debridement. Instructions for home care to prevent wax buildup are given.  Followup: Return in about 6 months (around 12/04/2021).  Cathlean Cower, MD 06/09/2021 7:01 PM [facility]

## 2021-06-06 NOTE — Patient Instructions (Addendum)
You will be contacted regarding the referral for: colonoscopy  You had the flu shot today, and Prevnar 20  Your right ear was cleared of wax today  We have discussed the Cardiac CT Score test to measure the calcification level (if any) in your heart arteries.  This test has been ordered in our [place], so please call  CT directly, as they prefer this, at [phone] to be scheduled.  Please quit smoking  You will be contacted regarding the referral for: lipid clinic  Please take OTC Vitamin D3 at 2000 units per day, indefinitely, or 4000 units if you already take the 2000 units.    Ok to increase the losartan to 100 mg per day  Please continue all other medications as before, and refills have been done if requested.  Please have the pharmacy call with any other refills you may need.  Please continue your efforts at being more active, low cholesterol diet, and weight control.  You are otherwise up to date with prevention measures today.  Please keep your appointments with your specialists as you may have planned  Please go to the LAB at the blood drawing area for the tests to be done  You will be contacted by phone if any changes need to be made immediately.  Otherwise, you will receive a letter about your results with an explanation, but please check with MyChart first.  Please remember to sign up for MyChart if you have not done so, as this will be important to you in the future with finding out test results, communicating by private email, and scheduling acute appointments online when needed.  Please make an Appointment to return in 6 months, or sooner if needed

## 2021-06-07 ENCOUNTER — Encounter: Payer: Self-pay | Admitting: Internal Medicine

## 2021-06-07 ENCOUNTER — Other Ambulatory Visit: Payer: Self-pay | Admitting: Internal Medicine

## 2021-06-07 DIAGNOSIS — E7800 Pure hypercholesterolemia, unspecified: Secondary | ICD-10-CM

## 2021-06-07 DIAGNOSIS — N1831 Chronic kidney disease, stage 3a: Secondary | ICD-10-CM

## 2021-06-07 MED ORDER — EZETIMIBE 10 MG PO TABS: ORAL_TABLET | ORAL | 3 refills | Status: DC

## 2021-06-07 MED ORDER — FAMOTIDINE 20 MG PO TABS: 20.0000 mg | ORAL_TABLET | Freq: Two times a day (BID) | ORAL | 3 refills | Status: DC

## 2021-06-07 MED ORDER — GABAPENTIN 100 MG PO CAPS
100.0000 mg | ORAL_CAPSULE | Freq: Three times a day (TID) | ORAL | 1 refills | Status: DC
Start: 2021-06-07 — End: 2021-10-17

## 2021-06-07 MED ORDER — BUPROPION HCL ER (XL) 300 MG PO TB24: 300.0000 mg | ORAL_TABLET | Freq: Every day | ORAL | 3 refills | Status: DC

## 2021-06-07 MED ORDER — CLONAZEPAM 1 MG PO TABS: ORAL_TABLET | ORAL | 5 refills | Status: DC

## 2021-06-07 MED ORDER — IBUPROFEN 800 MG PO TABS: 800.0000 mg | ORAL_TABLET | Freq: Three times a day (TID) | ORAL | 1 refills | Status: DC | PRN

## 2021-06-07 NOTE — Telephone Encounter (Signed)
Prescription sent to the pharmacy. Medications pended that I am unable to send.

## 2021-06-09 ENCOUNTER — Encounter: Payer: Self-pay | Admitting: Internal Medicine

## 2021-06-09 DIAGNOSIS — H6122 Impacted cerumen, left ear: Secondary | ICD-10-CM | POA: Insufficient documentation

## 2021-06-09 DIAGNOSIS — H6121 Impacted cerumen, right ear: Secondary | ICD-10-CM | POA: Insufficient documentation

## 2021-06-09 NOTE — Assessment & Plan Note (Signed)
°  Severe uncontrolled with LDL > 190 and goal ldl < 100, pt to continue current crestor, zetia and refer lipid clinic, for CT card score

## 2021-06-09 NOTE — Assessment & Plan Note (Signed)
Last vitamin D Lab Results  Component Value Date   VD25OH 26.98 (L) 06/06/2021   Low, to start oral replacement

## 2021-06-09 NOTE — Assessment & Plan Note (Signed)
Lab Results  Component Value Date   VITAMINB12 251 06/06/2021   Low, to start oral replacement - b12 1000 mcg qd

## 2021-06-09 NOTE — Assessment & Plan Note (Signed)
BP Readings from Last 3 Encounters:  06/06/21 140/80  05/07/20 140/76  11/24/18 136/84   Mild uncontrolled, for incresaed losartan 100 qd,

## 2021-06-09 NOTE — Assessment & Plan Note (Signed)
Lab Results  Component Value Date   CREATININE 1.18 06/06/2021   Stable overall, cont to avoid nephrotoxins

## 2021-06-09 NOTE — Assessment & Plan Note (Signed)
Resolved and hearing improved with irrigation  Ceruminosis is noted.  Wax is removed by syringing and manual debridement. Instructions for home care to prevent wax buildup are given.

## 2021-06-09 NOTE — Assessment & Plan Note (Signed)
I suspect mild worsening but decliens restart prozac

## 2021-06-09 NOTE — Assessment & Plan Note (Signed)
Age and sex appropriate education and counseling updated with regular exercise and diet Referrals for preventative services - for colonoscopy, but declines mammogram, dxa Immunizations addressed - for flu shot, prevnar 20 but declines covid booster, shingrix, tdap Smoking counseling  - none needed Evidence for depression or other mood disorder - chronic depression provably worsening  - declines restart SSRI Most recent labs reviewed. I have personally reviewed and have noted: 1) the patient's medical and social history 2) The patient's current medications and supplements 3) The patient's height, weight, and BMI have been recorded in the chart

## 2021-06-11 ENCOUNTER — Encounter: Payer: Self-pay | Admitting: Internal Medicine

## 2021-06-11 NOTE — Telephone Encounter (Signed)
Called patient to clarify that she should not take her Advil 800 mg per recommendation from Dr. Jenny Reichmann. Patient verbalize understanding. No further questions.

## 2021-06-19 ENCOUNTER — Other Ambulatory Visit: Payer: Self-pay | Admitting: Nephrology

## 2021-06-19 DIAGNOSIS — D631 Anemia in chronic kidney disease: Secondary | ICD-10-CM | POA: Diagnosis not present

## 2021-06-19 DIAGNOSIS — N1831 Chronic kidney disease, stage 3a: Secondary | ICD-10-CM

## 2021-06-19 DIAGNOSIS — K219 Gastro-esophageal reflux disease without esophagitis: Secondary | ICD-10-CM | POA: Diagnosis not present

## 2021-06-19 DIAGNOSIS — I129 Hypertensive chronic kidney disease with stage 1 through stage 4 chronic kidney disease, or unspecified chronic kidney disease: Secondary | ICD-10-CM | POA: Diagnosis not present

## 2021-06-19 DIAGNOSIS — N2581 Secondary hyperparathyroidism of renal origin: Secondary | ICD-10-CM | POA: Diagnosis not present

## 2021-06-24 ENCOUNTER — Other Ambulatory Visit: Payer: Medicare Other

## 2021-06-25 ENCOUNTER — Encounter: Payer: Self-pay | Admitting: Internal Medicine

## 2021-06-26 ENCOUNTER — Ambulatory Visit
Admission: RE | Admit: 2021-06-26 | Discharge: 2021-06-26 | Disposition: A | Discharge status: Home / Self Care | Payer: Medicare Other | Source: Ambulatory Visit | Attending: Nephrology | Admitting: Nephrology

## 2021-06-26 DIAGNOSIS — I129 Hypertensive chronic kidney disease with stage 1 through stage 4 chronic kidney disease, or unspecified chronic kidney disease: Secondary | ICD-10-CM | POA: Diagnosis not present

## 2021-06-26 DIAGNOSIS — N189 Chronic kidney disease, unspecified: Secondary | ICD-10-CM | POA: Diagnosis not present

## 2021-06-26 DIAGNOSIS — N281 Cyst of kidney, acquired: Secondary | ICD-10-CM | POA: Diagnosis not present

## 2021-06-26 DIAGNOSIS — N1831 Chronic kidney disease, stage 3a: Secondary | ICD-10-CM

## 2021-06-26 NOTE — Telephone Encounter (Signed)
Very sorry I have no information to offer about htis ?

## 2021-07-11 ENCOUNTER — Ambulatory Visit: Payer: Medicare Other

## 2021-07-22 NOTE — Progress Notes (Signed)
?[name] D.O. ?[facility] ?[place] ?Phone: [phone] ?Subjective:   ?I, [name], am serving as a Education administrator for Dr. Hulan Saas. ?This visit occurred during the SARS-CoV-2 public health emergency.  Safety protocols were in place, including screening questions prior to the visit, additional usage of staff PPE, and extensive cleaning of exam room while observing appropriate contact time as indicated for disinfecting solutions.  ? ?I'm seeing this patient by the request  of:  Biagio Borg, MD ? ?CC: bilateral hand pain  ? ?[id number]  ?[name] is a 71 y.o. female coming in with complaint of B hand pain. Known arthritis of the [place]. Last seen in 2019 Patient states thumb pain bilaterally. Back pain as well. Can't take muscle relaxer because of kidney's. No interventions. Been getting worse over last few months, losing grip strength. Back pain off and on 3 years its sporadic. Pain will last about 2-3 weeks.  ? ? ?  ? ?Past Medical History:  ?Diagnosis Date  ? ANXIETY 12/01/2006  ? Arthritis   ? Blepharospasm 12/01/2006  ? BONE SPUR 12/01/2006  ? Cellulitis and abscess of oral soft tissues 05/01/2008  ? Cervicalgia 08/24/2008  ? COLD SORE 05/30/2008  ? DEPRESSION 12/01/2006  ? EXCISION OF GANGLION CYST, WRIST, HX OF 12/01/2006  ? GERD 12/01/2006  ? HYPERLIPIDEMIA 12/01/2006  ? HYPERTENSION, BORDERLINE 12/01/2006  ? LOW BACK PAIN 12/01/2006  ? MACULAR DEGENERATION 12/01/2006  ? OTITIS MEDIA, ACUTE, LEFT 05/30/2008  ? PARESTHESIA 08/24/2008  ? SINUSITIS- ACUTE-NOS 01/07/2010  ? ?Past Surgical History:  ?Procedure Laterality Date  ? ABDOMINAL HYSTERECTOMY    ? blepharospasm    ? bone spur    ? removal right foot  ? BREAST LUMPECTOMY    ? right  ? GANGLION CYST EXCISION    ? H/O Macular degeneration    ? ?Social History  ? ?Socioeconomic History  ? Marital status: Married  ?  Spouse name: Not on file  ? Number of children: 1  ? Years of education: Not on file  ? Highest education level: Not  on file  ?Occupational History  ?  Employer: [employer]  ?Tobacco Use  ? Smoking status: Every Day  ? Smokeless tobacco: Never  ?Vaping Use  ? Vaping Use: Never used  ?Substance and Sexual Activity  ? Alcohol use: No  ? Drug use: No  ? Sexual activity: Yes  ?  Comment: 1st intercourse- 19, partners-  married- 52 yrs   ?Other Topics Concern  ? Not on file  ?Social History Narrative  ? Not on file  ? ?Social Determinants of Health  ? ?Financial Resource Strain: Not on file  ?Food Insecurity: Not on file  ?Transportation Needs: Not on file  ?Physical Activity: Not on file  ?Stress: Not on file  ?Social Connections: Not on file  ? ?Allergies  ?Allergen Reactions  ? Codeine   ? Hydrocodone-Acetaminophen   ? Loratadine-Pseudoephedrine Er   ? Statins Other (See Comments)  ?  Joint and muscle cramp  ? ?Family History  ?Problem Relation Age of Onset  ? Hypertension Mother   ? Pulmonary fibrosis Mother   ? Heart disease Father   ? Heart attack Father   ? Diabetes Daughter   ? ? ? ?Current Outpatient Medications (Cardiovascular):  ?  ezetimibe (ZETIA) 10 MG tablet, TAKE 1 TABLET(10 MG) BY MOUTH DAILY ?  losartan (COZAAR) 100 MG tablet, Take 1 tablet (100 mg total) by mouth  daily. ?  rosuvastatin (CRESTOR) 40 MG tablet, TAKE 1 TABLET BY MOUTH once daily ? ? ?Current Outpatient Medications (Analgesics):  ?  aspirin 81 MG tablet, Take 81 mg by mouth daily. ?  ibuprofen (ADVIL) 800 MG tablet, Take 1 tablet (800 mg total) by mouth every 8 (eight) hours as needed. ? ? ?Current Outpatient Medications (Other):  ?  buPROPion (WELLBUTRIN XL) 300 MG 24 hr tablet, Take 1 tablet (300 mg total) by mouth daily. ?  clonazePAM (KLONOPIN) 1 MG tablet, TAKE 1/2 TO 1 TABLET BY MOUTH TWICE DAILY AS NEEDED ?  cyclobenzaprine (FLEXERIL) 5 MG tablet, TAKE 1 TABLET BY MOUTH THREE TIMES A DAY AS NEEDED MUSCLE SPASMS ?  famotidine (PEPCID) 20 MG tablet, Take 1 tablet (20 mg total) by mouth 2 (two) times daily. ?  gabapentin (NEURONTIN) 100 MG  capsule, Take 1 capsule (100 mg total) by mouth 3 (three) times daily. ? ? ?Reviewed prior external information including notes and imaging from  ?primary care provider ?As well as notes that were available from care everywhere and other healthcare systems. ? ?Past medical history, social, surgical and family history all reviewed in electronic medical record.  No pertanent information unless stated regarding to the chief complaint.  ? ?Review of Systems: ? No headache, visual changes, nausea, vomiting, diarrhea, constipation, dizziness, abdominal pain, skin rash, fevers, chills, night sweats, weight loss, swollen lymph nodes, body aches, joint swelling, chest pain, shortness of breath, mood changes. POSITIVE muscle aches ? ?Objective  ?Blood pressure 138/70, pulse 75, height '5\' 3"'$  (1.6 m), weight 121 lb (54.9 kg), SpO2 96 %. ?  ?General: No apparent distress alert and oriented x3 mood and affect normal, dressed appropriately.  ?HEENT: Pupils equal, extraocular movements intact  ?Respiratory: Patient's speak in full sentences and does not appear short of breath  ?Cardiovascular: No lower extremity edema, non tender, no erythema  ?Gait normal with good balance and coordination.  ?MSK: Hand exam bilaterally shows the patient does have some mild atrophy of the thenar eminence.  Does have swelling noted of the MCP joint bilaterally.  Positive grind test noted bilaterally. ? ?Procedure: Real-time Ultrasound Guided Injection of right CMC joint ?Device: GE Logiq Q7 ?Ultrasound guided injection is preferred based studies that show increased duration, increased effect, greater accuracy, decreased procedural pain, increased response rate, and decreased cost with ultrasound guided versus blind injection.  ?Verbal informed consent obtained.  ?Time-out conducted.  ?Noted no overlying erythema, induration, or other signs of local infection.  ?Skin prepped in a sterile fashion.  ?Local anesthesia: Topical Ethyl chloride.  ?With  sterile technique and under real time ultrasound guidance: With a 25-gauge half inch needle injected with 0.5 cc of 0.5% Marcaine and 0.5 cc of Kenalog 40 mg/mL ?Completed without difficulty  ?Pain immediately resolved suggesting accurate placement of the medication.  ?Advised to call if fevers/chills, erythema, induration, drainage, or persistent bleeding.  ?Impression: Technically successful ultrasound guided injection. ? ?Procedure: Real-time Ultrasound Guided Injection of left CMC joint ?Device: GE Logiq Q7 ?Ultrasound guided injection is preferred based studies that show increased duration, increased effect, greater accuracy, decreased procedural pain, increased response rate, and decreased cost with ultrasound guided versus blind injection.  ?Verbal informed consent obtained.  ?Time-out conducted.  ?Noted no overlying erythema, induration, or other signs of local infection.  ?Skin prepped in a sterile fashion.  ?Local anesthesia: Topical Ethyl chloride.  ?With sterile technique and under real time ultrasound guidance: With a 25-gauge half inch needle injecting 0.5 cc  of 0.5% Marcaine and 0.5 cc of Kenalog 40 mg per ?Completed without difficulty  ?Pain immediately resolved suggesting accurate placement of the medication.  ?Advised to call if fevers/chills, erythema, induration, drainage, or persistent bleeding.  ?Impression: Technically successful ultrasound guided injection. ? ?  ?Impression and Recommendations:  ?  ? ?The above documentation has been reviewed and is accurate and complete Lyndal Pulley, DO ? ? ? ?

## 2021-07-23 ENCOUNTER — Other Ambulatory Visit: Payer: Self-pay

## 2021-07-23 ENCOUNTER — Ambulatory Visit (INDEPENDENT_AMBULATORY_CARE_PROVIDER_SITE_OTHER): Payer: Medicare Other

## 2021-07-23 ENCOUNTER — Encounter: Payer: Self-pay | Admitting: Family Medicine

## 2021-07-23 ENCOUNTER — Ambulatory Visit: Payer: Self-pay

## 2021-07-23 ENCOUNTER — Ambulatory Visit: Payer: Medicare Other | Admitting: Family Medicine

## 2021-07-23 VITALS — BP 138/70 | HR 75 | Ht 63.0 in | Wt 121.0 lb

## 2021-07-23 DIAGNOSIS — M545 Low back pain, unspecified: Secondary | ICD-10-CM

## 2021-07-23 DIAGNOSIS — M180 Bilateral primary osteoarthritis of first carpometacarpal joints: Secondary | ICD-10-CM | POA: Diagnosis not present

## 2021-07-23 DIAGNOSIS — M546 Pain in thoracic spine: Secondary | ICD-10-CM | POA: Diagnosis not present

## 2021-07-23 DIAGNOSIS — M4316 Spondylolisthesis, lumbar region: Secondary | ICD-10-CM | POA: Diagnosis not present

## 2021-07-23 NOTE — Assessment & Plan Note (Signed)
Patient has had difficulty with low back pain for quite some time.  Recently did have a renal ultrasound secondary to chronic kidney disease showing more of a possibility on the contralateral side.  Some simple cyst noted but nothing on the left.  Discussed with patient about the possibility of CT scan if necessary.  Patient will get x-rays of the lower back today and given home exercises.  Patient is on gabapentin and encouraged her to take it on a more regular basis.  Follow-up with me again in 6 to 8 weeks ?

## 2021-07-23 NOTE — Assessment & Plan Note (Signed)
Chronic, with exacerbation.  Discussed with patient icing regimen and home exercises, bracing also at night that could be more beneficial, topical anti-inflammatories.  Follow-up again in 6 to 8 weeks ?

## 2021-07-23 NOTE — Patient Instructions (Addendum)
Injections today ?Do prescribed exercises at least 3x a week ?Xray today ?See you again in 6-8 weeks ?

## 2021-08-28 ENCOUNTER — Encounter: Payer: Self-pay | Admitting: Internal Medicine

## 2021-09-05 ENCOUNTER — Ambulatory Visit: Payer: Medicare Other | Admitting: Family Medicine

## 2021-09-11 NOTE — Progress Notes (Signed)
Patient was not seen due to feeling ill, told to potentially go to ER if she starts feeling worse.  Patient states that she did have a viral illness going around her house.  Patient was scheduled at a different date.

## 2021-09-12 ENCOUNTER — Encounter: Payer: Medicare Other | Admitting: Family Medicine

## 2021-09-19 NOTE — Progress Notes (Signed)
[place] Phone: [phone] Subjective:   [name], am serving as a scribe for Dr. Hulan Saas.   I'm seeing this patient by the request  of:  Kim Borg, MD  CC: back and thumb pain   [id number]  07/23/2021 Patient has had difficulty with low back pain for quite some time.  Recently did have a renal ultrasound secondary to chronic kidney disease showing more of a possibility on the contralateral side.  Some simple cyst noted but nothing on the left.  Discussed with patient about the possibility of CT scan if necessary.  Patient will get x-rays of the lower back today and given home exercises.  Patient is on gabapentin and encouraged her to take it on a more regular basis.  Follow-up with me again in 6 to 8 weeks  Chronic, with exacerbation.  Discussed with patient icing regimen and home exercises, bracing also at night that could be more beneficial, topical anti-inflammatories.  Follow-up again in 6 to 8 weeks  Updated 09/24/2021 [name] is a 71 y.o. female coming in with complaint of back and thumb pain. Pain in back got worse when she was holding grandson and she fell to her knees in pain. Pain will increase easily but is not constant. Patient said she went to chiro years ago and this helped.         Past Medical History:  Diagnosis Date   ANXIETY 12/01/2006   Arthritis    Blepharospasm 12/01/2006   BONE SPUR 12/01/2006   Cellulitis and abscess of oral soft tissues 05/01/2008   Cervicalgia 08/24/2008   COLD SORE 05/30/2008   DEPRESSION 12/01/2006   EXCISION OF GANGLION CYST, WRIST, HX OF 12/01/2006   GERD 12/01/2006   HYPERLIPIDEMIA 12/01/2006   HYPERTENSION, BORDERLINE 12/01/2006   LOW BACK PAIN 12/01/2006   MACULAR DEGENERATION 12/01/2006   OTITIS MEDIA, ACUTE, LEFT 05/30/2008   PARESTHESIA 08/24/2008   SINUSITIS- ACUTE-NOS 01/07/2010   Past Surgical History:  Procedure Laterality Date   ABDOMINAL  HYSTERECTOMY     blepharospasm     bone spur     removal right foot   BREAST LUMPECTOMY     right   GANGLION CYST EXCISION     H/O Macular degeneration     Social History   Socioeconomic History   Marital status: Married    Spouse name: Not on file   Number of children: 1   Years of education: Not on file   Highest education level: Not on file  Occupational History    Employer: [employer]  Tobacco Use   Smoking status: Every Day   Smokeless tobacco: Never  Vaping Use   Vaping Use: Never used  Substance and Sexual Activity   Alcohol use: No   Drug use: No   Sexual activity: Yes    Comment: 1st intercourse- 68, partners-  married- 23 yrs   Other Topics Concern   Not on file  Social History Narrative   Not on file   Social Determinants of Health   Financial Resource Strain: Not on file  Food Insecurity: Not on file  Transportation Needs: Not on file  Physical Activity: Not on file  Stress: Not on file  Social Connections: Not on file   Allergies  Allergen Reactions   Codeine    Hydrocodone-Acetaminophen    Loratadine-Pseudoephedrine Er    Statins Other (See Comments)    Joint and muscle  cramp   Family History  Problem Relation Age of Onset   Hypertension Mother    Pulmonary fibrosis Mother    Heart disease Father    Heart attack Father    Diabetes Daughter      Current Outpatient Medications (Cardiovascular):    ezetimibe (ZETIA) 10 MG tablet, TAKE 1 TABLET(10 MG) BY MOUTH DAILY   losartan (COZAAR) 100 MG tablet, Take 1 tablet (100 mg total) by mouth daily.   rosuvastatin (CRESTOR) 40 MG tablet, TAKE 1 TABLET BY MOUTH once daily   Current Outpatient Medications (Analgesics):    aspirin 81 MG tablet, Take 81 mg by mouth daily.   ibuprofen (ADVIL) 800 MG tablet, Take 1 tablet (800 mg total) by mouth every 8 (eight) hours as needed.   Current Outpatient Medications (Other):    buPROPion (WELLBUTRIN XL) 300 MG 24 hr tablet, Take 1 tablet (300  mg total) by mouth daily.   clonazePAM (KLONOPIN) 1 MG tablet, TAKE 1/2 TO 1 TABLET BY MOUTH TWICE DAILY AS NEEDED   famotidine (PEPCID) 20 MG tablet, Take 1 tablet (20 mg total) by mouth 2 (two) times daily.   gabapentin (NEURONTIN) 100 MG capsule, Take 1 capsule (100 mg total) by mouth 3 (three) times daily.   cyclobenzaprine (FLEXERIL) 5 MG tablet, TAKE 1 TABLET BY MOUTH THREE TIMES A DAY AS NEEDED MUSCLE SPASMS   Reviewed prior external information including notes and imaging from  primary care provider As well as notes that were available from care everywhere and other healthcare systems.  Past medical history, social, surgical and family history all reviewed in electronic medical record.  No pertanent information unless stated regarding to the chief complaint.   Review of Systems:  No headache, visual changes, nausea, vomiting, diarrhea, constipation, dizziness, abdominal pain, skin rash, fevers, chills, night sweats, weight loss, swollen lymph nodes, body aches, joint swelling, chest pain, shortness of breath, mood changes. POSITIVE muscle aches  Objective  Blood pressure 140/84, pulse 88, height '5\' 3"'$  (1.6 m), weight 120 lb (54.4 kg), SpO2 98 %.   General: No apparent distress alert and oriented x3 mood and affect normal, dressed appropriately.  HEENT: Pupils equal, extraocular movements intact  Respiratory: Patient's speak in full sentences and does not appear short of breath  Cardiovascular: No lower extremity edema, non tender, no erythema  MSK:  loss of lordosis tightness over the SI joint left greater than right.  Patient does have some pain more with flexion and extension  neg SLT, neurovascularly intact distally.   Osteopathic findings C2 flexed rotated and side bent right C5 flexed rotated and side bent left T3 extended rotated and side bent right inhaled third rib T8 extended rotated and side bent left L1 flexed rotated and side bent right Sacrum right on right      Impression and Recommendations:     The above documentation has been reviewed and is accurate and complete Lyndal Pulley, DO

## 2021-09-24 ENCOUNTER — Ambulatory Visit (INDEPENDENT_AMBULATORY_CARE_PROVIDER_SITE_OTHER): Payer: Medicare Other | Admitting: Family Medicine

## 2021-09-24 VITALS — BP 140/84 | HR 88 | Ht 63.0 in | Wt 120.0 lb

## 2021-09-24 DIAGNOSIS — M9901 Segmental and somatic dysfunction of cervical region: Secondary | ICD-10-CM | POA: Diagnosis not present

## 2021-09-24 DIAGNOSIS — M9903 Segmental and somatic dysfunction of lumbar region: Secondary | ICD-10-CM

## 2021-09-24 DIAGNOSIS — M9904 Segmental and somatic dysfunction of sacral region: Secondary | ICD-10-CM

## 2021-09-24 DIAGNOSIS — M533 Sacrococcygeal disorders, not elsewhere classified: Secondary | ICD-10-CM

## 2021-09-24 DIAGNOSIS — M9908 Segmental and somatic dysfunction of rib cage: Secondary | ICD-10-CM | POA: Diagnosis not present

## 2021-09-24 DIAGNOSIS — M9902 Segmental and somatic dysfunction of thoracic region: Secondary | ICD-10-CM

## 2021-09-24 NOTE — Assessment & Plan Note (Signed)
Does appear to be more of a sacroiliac dysfunction.  Discussed with patient icing regimen and home exercises.  Has responded well to manipulation and hopefully will continue to respond well.  Discussed icing regimen.  Increase activity slowly.  Follow-up again in 6 to 8 weeks.  We discussed worsening pain that advanced imaging and possible injections would be necessary.

## 2021-09-24 NOTE — Patient Instructions (Signed)
Tried manipulation Try SI exercises See me in 6-7 weeks

## 2021-09-24 NOTE — Assessment & Plan Note (Signed)

## 2021-09-30 ENCOUNTER — Telehealth: Payer: Self-pay | Admitting: Family Medicine

## 2021-09-30 NOTE — Telephone Encounter (Signed)
Patient sent the following message through [facility] appointment scheduling:  When i was in the office, i meant to ask Dr Tamala Julian if he could order me a backbrace.  I have bought several but even the copper ones, xs too large.  My nody is not very big and none of them are tight enough. If u have them in office great.  I will come by if that is doable.  He made me feel so great i forgot all about it.  I have not hurt at all, until this evening.  It's tolerable but picked up too many heavy items today. As always i appreciate you all!!but not the baby LOL   Please advise.

## 2021-10-03 ENCOUNTER — Ambulatory Visit: Payer: Medicare Other | Admitting: Family Medicine

## 2021-10-15 ENCOUNTER — Encounter: Payer: Self-pay | Admitting: Internal Medicine

## 2021-10-16 NOTE — Progress Notes (Signed)
[place] Phone: [phone] Subjective:   Shirlyn Goltz, PhD, LAT, ATC acting as a scribe for Charlann Boxer, DO.  I'm seeing this patient by the request  of:  Biagio Borg, MD  CC: Back and neck pain following up  [id number]  [name] is a 71 y.o. female coming in with complaint of back and neck pain. OMT on 09/24/2021. Patient her neck and back are feeling much much better. Pt is not having extreme pain like she was, just soreness/pressure/stiffness.  Patient is feeling like she is getting much closer to her baseline  Medications patient has been prescribed: None        Reviewed prior external information including notes and imaging from previsou exam, outside providers and external EMR if available.   As well as notes that were available from care everywhere and other healthcare systems.  Past medical history, social, surgical and family history all reviewed in electronic medical record.  No pertanent information unless stated regarding to the chief complaint.   Past Medical History:  Diagnosis Date   ANXIETY 12/01/2006   Arthritis    Blepharospasm 12/01/2006   BONE SPUR 12/01/2006   Cellulitis and abscess of oral soft tissues 05/01/2008   Cervicalgia 08/24/2008   COLD SORE 05/30/2008   DEPRESSION 12/01/2006   EXCISION OF GANGLION CYST, WRIST, HX OF 12/01/2006   GERD 12/01/2006   HYPERLIPIDEMIA 12/01/2006   HYPERTENSION, BORDERLINE 12/01/2006   LOW BACK PAIN 12/01/2006   MACULAR DEGENERATION 12/01/2006   OTITIS MEDIA, ACUTE, LEFT 05/30/2008   PARESTHESIA 08/24/2008   SINUSITIS- ACUTE-NOS 01/07/2010    Allergies  Allergen Reactions   Codeine    Hydrocodone-Acetaminophen    Loratadine-Pseudoephedrine Er    Statins Other (See Comments)    Joint and muscle cramp     Review of Systems:  No headache, visual changes, nausea, vomiting, diarrhea, constipation, dizziness, abdominal pain, skin rash, fevers, chills,  night sweats, weight loss, swollen lymph nodes, body aches, joint swelling, chest pain, shortness of breath, mood changes. POSITIVE muscle aches  Objective  Blood pressure (!) 174/88, pulse 70, height '5\' 3"'$  (1.6 m), weight 121 lb 9.6 oz (55.2 kg), SpO2 99 %.   General: No apparent distress alert and oriented x3 mood and affect normal, dressed appropriately.  HEENT: Pupils equal, extraocular movements intact  Respiratory: Patient's speak in full sentences and does not appear short of breath  Cardiovascular: No lower extremity edema, non tender, no erythema  Gait MSK:  Back still has some mild loss of lordosis. Having worsening on the right side but very mild.  Seems to be more of the sacroiliac joints still noted.  Osteopathic findings C6 flexed rotated and side bent left L2 flexed rotated and side bent right Sacrum right on right       Assessment and Plan:  SI (sacroiliac) joint dysfunction Sacroiliac dysfunction noted.  Discussed posture and ergonomics.  Discussed hip abductor strengthening.  I believe patient is going to do much better now at this moment.  I will significant changes in management" continue home exercises as well.  Follow-up with me again in 3 months    Nonallopathic problems  Decision today to treat with OMT was based on Physical Exam  After verbal consent patient was treated with HVLA, ME, FPR techniques in cervical, lumbar, and sacral  areas  Patient tolerated the procedure well with improvement in symptoms  Patient given exercises, stretches and lifestyle  modifications  See medications in patient instructions if given  Patient will follow up in 12 weeks    The above documentation has been reviewed and is accurate and complete Lyndal Pulley, DO          Note: This dictation was prepared with Dragon dictation along with smaller phrase technology. Any transcriptional errors that result from this process are unintentional.

## 2021-10-16 NOTE — Telephone Encounter (Signed)
Since you had the zostavax in 2017 and this wears off after 5 yrs, please go to your local pharmacy and have the Shingrix series done if covered by your insurance.   Thanks

## 2021-10-17 ENCOUNTER — Ambulatory Visit: Payer: Medicare Other | Admitting: Family Medicine

## 2021-10-17 VITALS — BP 174/88 | HR 70 | Ht 63.0 in | Wt 121.6 lb

## 2021-10-17 DIAGNOSIS — M9902 Segmental and somatic dysfunction of thoracic region: Secondary | ICD-10-CM | POA: Diagnosis not present

## 2021-10-17 DIAGNOSIS — M9903 Segmental and somatic dysfunction of lumbar region: Secondary | ICD-10-CM | POA: Diagnosis not present

## 2021-10-17 DIAGNOSIS — M9904 Segmental and somatic dysfunction of sacral region: Secondary | ICD-10-CM | POA: Diagnosis not present

## 2021-10-17 DIAGNOSIS — M533 Sacrococcygeal disorders, not elsewhere classified: Secondary | ICD-10-CM

## 2021-10-17 MED ORDER — ZOSTER VAC RECOMB ADJUVANTED 50 MCG/0.5ML IM SUSR: 0.5000 mL | Freq: Once | INTRAMUSCULAR | 1 refills | Status: AC

## 2021-10-17 NOTE — Patient Instructions (Addendum)
Good to see you  Significant improvement!  Stay active   See me again in 3 months

## 2021-10-17 NOTE — Telephone Encounter (Signed)
Pt called her pharmacy and they informed her that she needs an RX from her PCP in order for her insurance to cover it.   Please advise.

## 2021-11-06 ENCOUNTER — Ambulatory Visit: Payer: Medicare Other | Admitting: Family Medicine

## 2021-11-08 ENCOUNTER — Ambulatory Visit: Payer: Medicare Other | Admitting: Family Medicine

## 2021-12-04 NOTE — Progress Notes (Unsigned)
[place] Phone: [phone] Subjective:   [name], am serving as a scribe for Dr. Hulan Saas.  I'm seeing this patient by the request  of:  Biagio Borg, MD  CC: Low back pain and hip pain  [id number]  AMAURI MEDELLIN is a 71 y.o. female coming in with complaint of back and neck pain. OMT on 10/17/2021. Patient states that she was having a back flare earlier this week. Used her husbands zanaflex and meloxicam to help with pain. Pain located to lumbar spine with no radiation. No new injury. Would like a handicap placard.   Medications patient has been prescribed: None  Taking:         Reviewed prior external information including notes and imaging from previsou exam, outside providers and external EMR if available.   As well as notes that were available from care everywhere and other healthcare systems.  Past medical history, social, surgical and family history all reviewed in electronic medical record.  No pertanent information unless stated regarding to the chief complaint.   Past Medical History:  Diagnosis Date   ANXIETY 12/01/2006   Arthritis    Blepharospasm 12/01/2006   BONE SPUR 12/01/2006   Cellulitis and abscess of oral soft tissues 05/01/2008   Cervicalgia 08/24/2008   COLD SORE 05/30/2008   DEPRESSION 12/01/2006   EXCISION OF GANGLION CYST, WRIST, HX OF 12/01/2006   GERD 12/01/2006   HYPERLIPIDEMIA 12/01/2006   HYPERTENSION, BORDERLINE 12/01/2006   LOW BACK PAIN 12/01/2006   MACULAR DEGENERATION 12/01/2006   OTITIS MEDIA, ACUTE, LEFT 05/30/2008   PARESTHESIA 08/24/2008   SINUSITIS- ACUTE-NOS 01/07/2010    Allergies  Allergen Reactions   Codeine    Hydrocodone-Acetaminophen    Loratadine-Pseudoephedrine Er    Statins Other (See Comments)    Joint and muscle cramp     Review of Systems:  No headache, visual changes, nausea, vomiting, diarrhea, constipation, dizziness, abdominal pain, skin  rash, fevers, chills, night sweats, weight loss, swollen lymph nodes, body aches, joint swelling, chest pain, shortness of breath, mood changes. POSITIVE muscle aches  Objective  Height '5\' 3"'$  (1.6 m).   General: No apparent distress alert and oriented x3 mood and affect normal, dressed appropriately.  HEENT: Pupils equal, extraocular movements intact  Respiratory: Patient's speak in full sentences and does not appear short of breath  Cardiovascular: No lower extremity edema, non tender, no erythema  Gait MSK:  Back does have some loss of lordosis.  Patient does have severe tenderness over the sacroiliac joint bilaterally and does seem to have some small effusion noted of the left side.  No skin breakdown noted.  Positive Faber bilaterally.  Patient does have some worsening pain with straight leg test as well though.  No true radicular symptoms  Osteopathic findings  T9 extended rotated and side bent left L2 flexed rotated and side bent right Sacrum right on right   After verbal consent patient was prepped with alcohol swab and with a 21-gauge 2 inch needle injecting the left SI joint with 1 cc of 0.5% Marcaine and 1 cc of Kenalog 40 mg/mL no blood loss.  Band-Aid placed.  Postinjection instructions given.  After verbal consent patient was prepped with alcohol swab and with a 21-gauge 2 inch needle injected into the right sacroiliac joint with 1 cc of 0.5% Marcaine and 1 cc of no blood loss, Band-Aid placed.  Postinjection instructions given    Assessment  and Plan:  SI (sacroiliac) joint dysfunction Bilateral injections given today, tolerated the procedure well.  Also did osteopathic manipulation.  Chronic problem with worsening symptoms.  Having worsening pain that is affecting daily activities and we will give patient a handicap placard today discussed if he continues to have worsening symptoms then MRI is going to be necessary.  Patient is in agreement with the plan.    Nonallopathic  problems  Decision today to treat with OMT was based on Physical Exam  After verbal consent patient was treated with HVLA, ME, FPR techniques in  thoracic, lumbar, and sacral  areas  Patient tolerated the procedure well with improvement in symptoms  Patient given exercises, stretches and lifestyle modifications  See medications in patient instructions if given  Patient will follow up in 4-8 weeks    The above documentation has been reviewed and is accurate and complete Lyndal Pulley, DO          Note: This dictation was prepared with Dragon dictation along with smaller phrase technology. Any transcriptional errors that result from this process are unintentional.

## 2021-12-05 ENCOUNTER — Ambulatory Visit: Payer: Medicare Other | Admitting: Family Medicine

## 2021-12-05 ENCOUNTER — Encounter: Payer: Self-pay | Admitting: Family Medicine

## 2021-12-05 VITALS — Ht 63.0 in

## 2021-12-05 DIAGNOSIS — M9904 Segmental and somatic dysfunction of sacral region: Secondary | ICD-10-CM

## 2021-12-05 DIAGNOSIS — M9902 Segmental and somatic dysfunction of thoracic region: Secondary | ICD-10-CM | POA: Diagnosis not present

## 2021-12-05 DIAGNOSIS — M533 Sacrococcygeal disorders, not elsewhere classified: Secondary | ICD-10-CM | POA: Diagnosis not present

## 2021-12-05 DIAGNOSIS — M9903 Segmental and somatic dysfunction of lumbar region: Secondary | ICD-10-CM | POA: Diagnosis not present

## 2021-12-05 NOTE — Patient Instructions (Addendum)
Injection in SI joints today  If not significantly better by Tuesday write [place] and we need to consider an MRI

## 2021-12-05 NOTE — Assessment & Plan Note (Signed)
Bilateral injections given today, tolerated the procedure well.  Also did osteopathic manipulation.  Chronic problem with worsening symptoms.  Having worsening pain that is affecting daily activities and we will give patient a handicap placard today discussed if he continues to have worsening symptoms then MRI is going to be necessary.  Patient is in agreement with the plan.

## 2021-12-06 ENCOUNTER — Other Ambulatory Visit: Payer: Self-pay

## 2021-12-06 ENCOUNTER — Ambulatory Visit (HOSPITAL_BASED_OUTPATIENT_CLINIC_OR_DEPARTMENT_OTHER): Payer: Medicare Other | Admitting: Internal Medicine

## 2021-12-06 MED ORDER — MELOXICAM 7.5 MG PO TABS: 7.5000 mg | ORAL_TABLET | Freq: Every day | ORAL | 0 refills | Status: DC

## 2021-12-06 MED ORDER — TIZANIDINE HCL 2 MG PO TABS: 2.0000 mg | ORAL_TABLET | Freq: Every day | ORAL | 0 refills | Status: DC

## 2022-01-08 NOTE — Progress Notes (Signed)
[place] [address] [place] Phone: [phone] Subjective:   IVilma Meckel, am serving as a scribe for Dr. Hulan Saas.  I'm seeing this patient by the request  of:  Biagio Borg, MD  CC: Neck and back pain follow-up  [id number]  [name] is a 71 y.o. female coming in with complaint of back and neck pain. OMT 12/05/2021. Patient states doing better. Not having those sharp pains, more discomfort. Feels like shes moving in a positive direction. Stopped taking all medications and doing more supplements. Would like meloxicam refill.  Medications patient has been prescribed: Zanaflex  Taking:         Reviewed prior external information including notes and imaging from previsou exam, outside providers and external EMR if available.   As well as notes that were available from care everywhere and other healthcare systems.  Past medical history, social, surgical and family history all reviewed in electronic medical record.  No pertanent information unless stated regarding to the chief complaint.   Past Medical History:  Diagnosis Date   ANXIETY 12/01/2006   Arthritis    Blepharospasm 12/01/2006   BONE SPUR 12/01/2006   Cellulitis and abscess of oral soft tissues 05/01/2008   Cervicalgia 08/24/2008   COLD SORE 05/30/2008   DEPRESSION 12/01/2006   EXCISION OF GANGLION CYST, WRIST, HX OF 12/01/2006   GERD 12/01/2006   HYPERLIPIDEMIA 12/01/2006   HYPERTENSION, BORDERLINE 12/01/2006   LOW BACK PAIN 12/01/2006   MACULAR DEGENERATION 12/01/2006   OTITIS MEDIA, ACUTE, LEFT 05/30/2008   PARESTHESIA 08/24/2008   SINUSITIS- ACUTE-NOS 01/07/2010    Allergies  Allergen Reactions   Codeine    Hydrocodone-Acetaminophen    Loratadine-Pseudoephedrine Er    Statins Other (See Comments)    Joint and muscle cramp     Review of Systems:  No headache, visual changes, nausea, vomiting, diarrhea, constipation, dizziness, abdominal pain, skin rash,  fevers, chills, night sweats, weight loss, swollen lymph nodes, body aches, joint swelling, chest pain, shortness of breath, mood changes. POSITIVE muscle aches  Objective  Blood pressure (!) 146/80, pulse 94, height '5\' 3"'$  (1.6 m), weight 118 lb (53.5 kg), SpO2 97 %.   General: No apparent distress alert and oriented x3 mood and affect normal, dressed appropriately.  HEENT: Pupils equal, extraocular movements intact  Respiratory: Patient's speak in full sentences and does not appear short of breath  Cardiovascular: No lower extremity edema, non tender, no erythema  MSK:  Back does have some mild loss of lordosis.  The patient does have some degenerative scoliosis noted.  Some increasing kyphosis noted of the upper back as well.  Osteopathic findings  C2 flexed rotated and side bent right C5 flexed rotated and side bent left T3 extended rotated and side bent right inhaled rib T6 extended rotated and side bent left L2 flexed rotated and side bent right Sacrum right on right     Assessment and Plan:  SI (sacroiliac) joint dysfunction Continue tightness on the right side attempted osteopathic manipulation today.  Patient did respond extremely well to it.  Patient states that with her injections previously has been doing better at her baseline.  Refill the meloxicam and Zanaflex for exacerbation continues and more burst follow-up again in 2 months    Nonallopathic problems  Decision today to treat with OMT was based on Physical Exam  After verbal consent patient was treated with HVLA, ME, FPR techniques in cervical, rib, thoracic, lumbar, and  sacral  areas  Patient tolerated the procedure well with improvement in symptoms  Patient given exercises, stretches and lifestyle modifications  See medications in patient instructions if given  Patient will follow up in 4-8 weeks    The above documentation has been reviewed and is accurate and complete Lyndal Pulley, DO           Note: This dictation was prepared with Dragon dictation along with smaller phrase technology. Any transcriptional errors that result from this process are unintentional.

## 2022-01-09 ENCOUNTER — Encounter: Payer: Self-pay | Admitting: Internal Medicine

## 2022-01-09 ENCOUNTER — Ambulatory Visit: Payer: Medicare Other | Admitting: Family Medicine

## 2022-01-09 ENCOUNTER — Encounter: Payer: Self-pay | Admitting: Family Medicine

## 2022-01-09 VITALS — BP 146/80 | HR 94 | Ht 63.0 in | Wt 118.0 lb

## 2022-01-09 DIAGNOSIS — M9908 Segmental and somatic dysfunction of rib cage: Secondary | ICD-10-CM

## 2022-01-09 DIAGNOSIS — M9904 Segmental and somatic dysfunction of sacral region: Secondary | ICD-10-CM | POA: Diagnosis not present

## 2022-01-09 DIAGNOSIS — M9903 Segmental and somatic dysfunction of lumbar region: Secondary | ICD-10-CM | POA: Diagnosis not present

## 2022-01-09 DIAGNOSIS — M533 Sacrococcygeal disorders, not elsewhere classified: Secondary | ICD-10-CM

## 2022-01-09 DIAGNOSIS — M9902 Segmental and somatic dysfunction of thoracic region: Secondary | ICD-10-CM | POA: Diagnosis not present

## 2022-01-09 DIAGNOSIS — M9901 Segmental and somatic dysfunction of cervical region: Secondary | ICD-10-CM

## 2022-01-09 MED ORDER — MELOXICAM 7.5 MG PO TABS: 7.5000 mg | ORAL_TABLET | Freq: Every day | ORAL | 0 refills | Status: DC

## 2022-01-09 MED ORDER — TIZANIDINE HCL 2 MG PO TABS: 2.0000 mg | ORAL_TABLET | Freq: Every day | ORAL | 0 refills | Status: DC

## 2022-01-09 NOTE — Assessment & Plan Note (Signed)
Continue tightness on the right side attempted osteopathic manipulation today.  Patient did respond extremely well to it.  Patient states that with her injections previously has been doing better at her baseline.  Refill the meloxicam and Zanaflex for exacerbation continues and more burst follow-up again in 2 months

## 2022-01-09 NOTE — Patient Instructions (Signed)
Prescription refilled Overall doing well Don't cook anyone a [place]

## 2022-01-16 ENCOUNTER — Ambulatory Visit: Payer: Medicare Other | Admitting: Family Medicine

## 2022-01-22 ENCOUNTER — Encounter: Payer: Self-pay | Admitting: Internal Medicine

## 2022-01-23 MED ORDER — CLONAZEPAM 1 MG PO TABS
ORAL_TABLET | ORAL | 5 refills | Status: DC
Start: 2022-01-23 — End: 2022-10-02

## 2022-01-31 ENCOUNTER — Ambulatory Visit (INDEPENDENT_AMBULATORY_CARE_PROVIDER_SITE_OTHER): Payer: Medicare Other

## 2022-01-31 VITALS — Ht 63.0 in | Wt 119.0 lb

## 2022-01-31 DIAGNOSIS — Z780 Asymptomatic menopausal state: Secondary | ICD-10-CM | POA: Diagnosis not present

## 2022-01-31 DIAGNOSIS — Z0000 Encounter for general adult medical examination without abnormal findings: Secondary | ICD-10-CM | POA: Diagnosis not present

## 2022-01-31 DIAGNOSIS — Z1231 Encounter for screening mammogram for malignant neoplasm of breast: Secondary | ICD-10-CM | POA: Diagnosis not present

## 2022-01-31 DIAGNOSIS — Z0100 Encounter for examination of eyes and vision without abnormal findings: Secondary | ICD-10-CM | POA: Diagnosis not present

## 2022-01-31 NOTE — Progress Notes (Signed)
Subjective:   [name] is a 71 y.o. female who presents for an Initial Medicare Annual Wellness Visit.   Virtual Visit via Telephone Note  I connected with  [name] on 01/31/22 at  1:00 PM EDT by telephone and verified that I am speaking with the correct person using two identifiers.  Location: Patient: home  Provider: Esmond Plants  Persons participating in the virtual visit: [name]   I discussed the limitations, risks, security and privacy concerns of performing an evaluation and management service by telephone and the availability of in person appointments. The patient expressed understanding and agreed to proceed.  Interactive audio and video telecommunications were attempted between this nurse and patient, however failed, due to patient having technical difficulties OR patient did not have access to video capability.  We continued and completed visit with audio only.  Some vital signs may be absent or patient reported.   Daphane Shepherd, LPN  Review of Systems     Cardiac Risk Factors include: advanced age (>47mn, >>60women);hypertension     Objective:    Today's Vitals   01/31/22 1303  Weight: 119 lb (54 kg)  Height: '5\' 3"'$  (1.6 m)   Body mass index is 21.08 kg/m.     01/31/2022    1:08 PM  Advanced Directives  Does Patient Have a Medical Advance Directive? Yes  Type of AParamedicof [place]ort [place] will  Copy of [place] Chart? No - copy requested    Current Medications (verified) Outpatient Encounter Medications as of 01/31/2022  Medication Sig   aspirin 81 MG tablet Take 81 mg by mouth daily.   clonazePAM (KLONOPIN) 1 MG tablet TAKE 1/2 TO 1 TABLET BY MOUTH TWICE DAILY AS NEEDED   famotidine (PEPCID) 20 MG tablet Take 1 tablet (20 mg total) by mouth 2 (two) times daily.   ibuprofen (ADVIL) 800 MG tablet Take 1 tablet (800 mg total) by mouth every 8 (eight) hours as needed.    meloxicam (MOBIC) 7.5 MG tablet Take 1 tablet (7.5 mg total) by mouth daily.   buPROPion (WELLBUTRIN XL) 300 MG 24 hr tablet Take 1 tablet (300 mg total) by mouth daily.   ezetimibe (ZETIA) 10 MG tablet TAKE 1 TABLET(10 MG) BY MOUTH DAILY (Patient not taking: Reported on 01/31/2022)   losartan (COZAAR) 100 MG tablet Take 1 tablet (100 mg total) by mouth daily. (Patient not taking: Reported on 01/31/2022)   rosuvastatin (CRESTOR) 40 MG tablet TAKE 1 TABLET BY MOUTH once daily (Patient not taking: Reported on 01/31/2022)   tiZANidine (ZANAFLEX) 2 MG tablet Take 1 tablet (2 mg total) by mouth at bedtime. (Patient not taking: Reported on 01/31/2022)   No facility-administered encounter medications on file as of 01/31/2022.    Allergies (verified) Codeine, Hydrocodone-acetaminophen, Loratadine-pseudoephedrine er, and Statins   History: Past Medical History:  Diagnosis Date   ANXIETY 12/01/2006   Arthritis    Blepharospasm 12/01/2006   BONE SPUR 12/01/2006   Cellulitis and abscess of oral soft tissues 05/01/2008   Cervicalgia 08/24/2008   COLD SORE 05/30/2008   DEPRESSION 12/01/2006   EXCISION OF GANGLION CYST, WRIST, HX OF 12/01/2006   GERD 12/01/2006   HYPERLIPIDEMIA 12/01/2006   HYPERTENSION, BORDERLINE 12/01/2006   LOW BACK PAIN 12/01/2006   MACULAR DEGENERATION 12/01/2006   OTITIS MEDIA, ACUTE, LEFT 05/30/2008   PARESTHESIA 08/24/2008   SINUSITIS- ACUTE-NOS 01/07/2010   Past Surgical History:  Procedure Laterality Date  ABDOMINAL HYSTERECTOMY     blepharospasm     bone spur     removal right foot   BREAST LUMPECTOMY     right   GANGLION CYST EXCISION     H/O Macular degeneration     Family History  Problem Relation Age of Onset   Hypertension Mother    Pulmonary fibrosis Mother    Heart disease Father    Heart attack Father    Diabetes Daughter    Social History   Socioeconomic History   Marital status: Married    Spouse name: Not on file   Number of children: 1   Years of education: Not on  file   Highest education level: Not on file  Occupational History    Employer: [employer]  Tobacco Use   Smoking status: Every Day   Smokeless tobacco: Never  Vaping Use   Vaping Use: Never used  Substance and Sexual Activity   Alcohol use: No   Drug use: No   Sexual activity: Yes    Comment: 1st intercourse- 52, partners-  married- 82 yrs   Other Topics Concern   Not on file  Social History Narrative   Not on file   Social Determinants of Health   Financial Resource Strain: Low Risk  (01/31/2022)   Overall Financial Resource Strain (CARDIA)    Difficulty of Paying Living Expenses: Not hard at all  Food Insecurity: No Food Insecurity (01/31/2022)   Hunger Vital Sign    Worried About Running Out of Food in the Last Year: Never true    [place] in the Last Year: Never true  Transportation Needs: No Transportation Needs (01/31/2022)   PRAPARE - Hydrologist (Medical): No    Lack of Transportation (Non-Medical): No  Physical Activity: Sufficiently Active (01/31/2022)   Exercise Vital Sign    Days of Exercise per Week: 5 days    Minutes of Exercise per Session: 30 min  Stress: No Stress Concern Present (01/31/2022)   [place]    Feeling of Stress : Not at all  Social Connections: Moderately Integrated (01/31/2022)   Social Connection and Isolation Panel [NHANES]    Frequency of Communication with Friends and Family: More than three times a week    Frequency of Social Gatherings with Friends and Family: More than three times a week    Attends Religious Services: More than 4 times per year    Active Member of [employer] or Organizations: No    Attends Music therapist: Never    Marital Status: Married    Tobacco Counseling Ready to quit: Not Answered Counseling given: Not Answered   Clinical Intake:  Pre-visit preparation completed: Yes  Pain : No/denies  pain     Nutritional Risks: None Diabetes: No  How often do you need to have someone help you when you read instructions, pamphlets, or other written materials from your doctor or pharmacy?: 1 - Never  Diabetic?no   Interpreter Needed?: No  Information entered by :: Jadene Pierini, LPN   Activities of Daily Living    01/31/2022    1:08 PM  In your present state of health, do you have any difficulty performing the following activities:  Hearing? 0  Vision? 0  Difficulty concentrating or making decisions? 0  Walking or climbing stairs? 0  Dressing or bathing? 0  Doing errands, shopping? 0  Preparing Food and eating ?  N  Using the Toilet? N  In the past six months, have you accidently leaked urine? N  Do you have problems with loss of bowel control? N  Managing your Medications? N  Managing your Finances? N  Housekeeping or managing your Housekeeping? N    Patient Care Team: Biagio Borg, MD as PCP - General  Indicate any recent Medical Services you may have received from other than [facility] providers in the past year (date may be approximate).     Assessment:   This is a routine wellness examination for [name].  Hearing/Vision screen Vision Screening - Comments:: Referral 01/31/2022  Dietary issues and exercise activities discussed: Current Exercise Habits: Home exercise routine, Type of exercise: walking, Time (Minutes): 30, Frequency (Times/Week): 5, Weekly Exercise (Minutes/Week): 150, Intensity: Mild, Exercise limited by: None identified   Goals Addressed             This Visit's Progress    DIET - INCREASE WATER INTAKE         Depression Screen    01/31/2022    1:07 PM 06/06/2021    1:32 PM 06/06/2021    1:10 PM 05/07/2020   11:49 AM 08/07/2016    8:23 AM 03/13/2016    3:14 PM 01/26/2015   10:28 AM  PHQ 2/9 Scores  PHQ - 2 Score 0 1 0 2 0 1 2    Fall Risk    01/31/2022    1:04 PM 06/06/2021    1:32 PM 06/06/2021    1:10 PM 05/07/2020   11:49 AM  05/07/2020   11:35 AM  Fall Risk   Falls in the past year? 0 0 0 0 0  Number falls in past yr: 0 0   0  Injury with Fall? 0 0   0  Risk for fall due to : No Fall Risks      Follow up Falls prevention discussed        [place]:  Any stairs in or around the home? No  If so, are there any without handrails? No  Home free of loose throw rugs in walkways, pet beds, electrical cords, etc? Yes  Adequate lighting in your home to reduce risk of falls? Yes   ASSISTIVE DEVICES UTILIZED TO PREVENT FALLS:  Life alert? No  Use of a cane, walker or w/c? No  Grab bars in the bathroom? No  Shower chair or bench in shower? No  Elevated toilet seat or a handicapped toilet? No         01/31/2022    1:09 PM  6CIT Screen  What Year? 0 points  What month? 0 points  What time? 0 points  Count back from 20 0 points  Months in reverse 0 points  Repeat phrase 2 points  Total Score 2 points    Immunizations Immunization History  Administered Date(s) Administered   Fluad Quad(high Dose 65+) 05/07/2020, 06/06/2021   Influenza Nasal 01/19/2016   Influenza-Unspecified 01/26/2014, 02/02/2015, 03/08/2018   PNEUMOCOCCAL CONJUGATE-20 06/06/2021   Td 08/23/2009    TDAP status: Due, Education has been provided regarding the importance of this vaccine. Advised may receive this vaccine at local pharmacy or Health Dept. Aware to provide a copy of the vaccination record if obtained from local pharmacy or Health Dept. Verbalized acceptance and understanding.  Flu Vaccine status: Up to date  Pneumococcal vaccine status: Up to date  Covid-19 vaccine status: Completed vaccines  Qualifies for Shingles Vaccine? Yes  Zostavax completed No   Shingrix Completed?: No.    Education has been provided regarding the importance of this vaccine. Patient has been advised to call insurance company to determine out of pocket expense if they have not yet received this vaccine. Advised  may also receive vaccine at local pharmacy or Health Dept. Verbalized acceptance and understanding.  Screening Tests Health Maintenance  Topic Date Due   COVID-19 Vaccine (1) Never done   COLONOSCOPY (Pts 45-73yr Insurance coverage will need to be confirmed)  Never done   Zoster Vaccines- Shingrix (1 of 2) Never done   INFLUENZA VACCINE  11/26/2021   MAMMOGRAM  06/06/2022 (Originally 02/03/2019)   DEXA SCAN  06/06/2022 (Originally 09/02/2017)   TETANUS/TDAP  06/06/2022 (Originally 08/24/2019)   Pneumonia Vaccine 71 Years old  Completed   Hepatitis C Screening  Completed   HPV VACCINES  Aged Out    Health Maintenance  Health Maintenance Due  Topic Date Due   COVID-19 Vaccine (1) Never done   COLONOSCOPY (Pts 45-486yrInsurance coverage will need to be confirmed)  Never done   Zoster Vaccines- Shingrix (1 of 2) Never done   INFLUENZA VACCINE  11/26/2021    Colorectal cancer screening: Referral to GI placed patient declines at this time . Pt aware the office will call re: appt.  Mammogram status: Ordered 01/31/2022. Pt provided with contact info and advised to call to schedule appt.   Bone Density status: Ordered 01/31/2022. Pt provided with contact info and advised to call to schedule appt.  Lung Cancer Screening: (Low Dose CT Chest recommended if Age 69-80ears, 30 pack-year currently smoking OR have quit w/in 15years.) does qualify.   Lung Cancer Screening Referral: Patient declines at this time   Additional Screening:  Hepatitis C Screening: does not qualify;   Vision Screening: Recommended annual ophthalmology exams for early detection of glaucoma and other disorders of the eye. Is the patient up to date with their annual eye exam?  No  Who is the provider or what is the name of the office in which the patient attends annual eye exams? None referral 01/31/2022 If pt is not established with a provider, would they like to be referred to a provider to establish care? No .    Dental Screening: Recommended annual dental exams for proper oral hygiene  Community Resource Referral / Chronic Care Management: CRR required this visit?  No   CCM required this visit?  No      Plan:     I have personally reviewed and noted the following in the patient's chart:   Medical and social history Use of alcohol, tobacco or illicit drugs  Current medications and supplements including opioid prescriptions. Patient is not currently taking opioid prescriptions. Functional ability and status Nutritional status Physical activity Advanced directives List of other physicians Hospitalizations, surgeries, and ER visits in previous 12 months Vitals Screenings to include cognitive, depression, and falls Referrals and appointments  In addition, I have reviewed and discussed with patient certain preventive protocols, quality metrics, and best practice recommendations. A written personalized care plan for preventive services as well as general preventive health recommendations were provided to patient.     LaDaphane ShepherdLPN   [id number] Nurse Notes: Due Tdap /Patient to schedule eye doctor appointment

## 2022-01-31 NOTE — Patient Instructions (Signed)
[name] , Thank you for taking time to come for your Medicare Wellness Visit. I appreciate your ongoing commitment to your health goals. Please review the following plan we discussed and let me know if I can assist you in the future.   These are the goals we discussed:  Goals      DIET - INCREASE WATER INTAKE        This is a list of the screening recommended for you and due dates:  Health Maintenance  Topic Date Due   COVID-19 Vaccine (1) Never done   Colon Cancer Screening  Never done   Zoster (Shingles) Vaccine (1 of 2) Never done   Flu Shot  11/26/2021   Mammogram  06/06/2022*   DEXA scan (bone density measurement)  06/06/2022*   Tetanus Vaccine  06/06/2022*   Pneumonia Vaccine  Completed   Hepatitis C Screening: USPSTF Recommendation to screen - Ages 18-79 yo.  Completed   HPV Vaccine  Aged Out  *Topic was postponed. The date shown is not the original due date.    Advanced directives: Please bring a copy of your health care power of attorney and living will to the office to be added to your chart at your convenience.   Conditions/risks identified: Aim for 30 minutes of exercise or brisk walking, 6-8 glasses of water, and 5 servings of fruits and vegetables each day.   Next appointment: Follow up in one year for your annual wellness visit    Preventive Care 65 Years and Older, Female Preventive care refers to lifestyle choices and visits with your health care provider that can promote health and wellness. What does preventive care include? A yearly physical exam. This is also called an annual well check. Dental exams once or twice a year. Routine eye exams. Ask your health care provider how often you should have your eyes checked. Personal lifestyle choices, including: Daily care of your teeth and gums. Regular physical activity. Eating a healthy diet. Avoiding tobacco and drug use. Limiting alcohol use. Practicing safe sex. Taking low-dose aspirin every  day. Taking vitamin and mineral supplements as recommended by your health care provider. What happens during an annual well check? The services and screenings done by your health care provider during your annual well check will depend on your age, overall health, lifestyle risk factors, and family history of disease. Counseling  Your health care provider may ask you questions about your: Alcohol use. Tobacco use. Drug use. Emotional well-being. Home and relationship well-being. Sexual activity. Eating habits. History of falls. Memory and ability to understand (cognition). Work and work Statistician. Reproductive health. Screening  You may have the following tests or measurements: Height, weight, and BMI. Blood pressure. Lipid and cholesterol levels. These may be checked every 5 years, or more frequently if you are over 100 years old. Skin check. Lung cancer screening. You may have this screening every year starting at age 27 if you have a 30-pack-year history of smoking and currently smoke or have quit within the past 15 years. Fecal occult blood test (FOBT) of the stool. You may have this test every year starting at age 52. Flexible sigmoidoscopy or colonoscopy. You may have a sigmoidoscopy every 5 years or a colonoscopy every 10 years starting at age 60. Hepatitis C blood test. Hepatitis B blood test. Sexually transmitted disease (STD) testing. Diabetes screening. This is done by checking your blood sugar (glucose) after you have not eaten for a while (fasting). You may have this done  every 1-3 years. Bone density scan. This is done to screen for osteoporosis. You may have this done starting at age 41. Mammogram. This may be done every 1-2 years. Talk to your health care provider about how often you should have regular mammograms. Talk with your health care provider about your test results, treatment options, and if necessary, the need for more tests. Vaccines  Your health care  provider may recommend certain vaccines, such as: Influenza vaccine. This is recommended every year. Tetanus, diphtheria, and acellular pertussis (Tdap, Td) vaccine. You may need a Td booster every 10 years. Zoster vaccine. You may need this after age 7. Pneumococcal 13-valent conjugate (PCV13) vaccine. One dose is recommended after age 16. Pneumococcal polysaccharide (PPSV23) vaccine. One dose is recommended after age 75. Talk to your health care provider about which screenings and vaccines you need and how often you need them. This information is not intended to replace advice given to you by your health care provider. Make sure you discuss any questions you have with your health care provider. Document Released: 05/11/2015 Document Revised: 01/02/2016 Document Reviewed: 02/13/2015 Elsevier Interactive Patient Education  2017 [place] Prevention in the Home Falls can cause injuries. They can happen to people of all ages. There are many things you can do to make your home safe and to help prevent falls. What can I do on the outside of my home? Regularly fix the edges of walkways and driveways and fix any cracks. Remove anything that might make you trip as you walk through a door, such as a raised step or threshold. Trim any bushes or trees on the path to your home. Use bright outdoor lighting. Clear any walking paths of anything that might make someone trip, such as rocks or tools. Regularly check to see if handrails are loose or broken. Make sure that both sides of any steps have handrails. Any raised decks and porches should have guardrails on the edges. Have any leaves, snow, or ice cleared regularly. Use sand or salt on walking paths during winter. Clean up any spills in your garage right away. This includes oil or grease spills. What can I do in the bathroom? Use night lights. Install grab bars by the toilet and in the tub and shower. Do not use towel bars as grab  bars. Use non-skid mats or decals in the tub or shower. If you need to sit down in the shower, use a plastic, non-slip stool. Keep the floor dry. Clean up any water that spills on the floor as soon as it happens. Remove soap buildup in the tub or shower regularly. Attach bath mats securely with double-sided non-slip rug tape. Do not have throw rugs and other things on the floor that can make you trip. What can I do in the bedroom? Use night lights. Make sure that you have a light by your bed that is easy to reach. Do not use any sheets or blankets that are too big for your bed. They should not hang down onto the floor. Have a firm chair that has side arms. You can use this for support while you get dressed. Do not have throw rugs and other things on the floor that can make you trip. What can I do in the kitchen? Clean up any spills right away. Avoid walking on wet floors. Keep items that you use a lot in easy-to-reach places. If you need to reach something above you, use a strong step stool that has  a grab bar. Keep electrical cords out of the way. Do not use floor polish or wax that makes floors slippery. If you must use wax, use non-skid floor wax. Do not have throw rugs and other things on the floor that can make you trip. What can I do with my stairs? Do not leave any items on the stairs. Make sure that there are handrails on both sides of the stairs and use them. Fix handrails that are broken or loose. Make sure that handrails are as long as the stairways. Check any carpeting to make sure that it is firmly attached to the stairs. Fix any carpet that is loose or worn. Avoid having throw rugs at the top or bottom of the stairs. If you do have throw rugs, attach them to the floor with carpet tape. Make sure that you have a light switch at the top of the stairs and the bottom of the stairs. If you do not have them, ask someone to add them for you. What else can I do to help prevent  falls? Wear shoes that: Do not have high heels. Have rubber bottoms. Are comfortable and fit you well. Are closed at the toe. Do not wear sandals. If you use a stepladder: Make sure that it is fully opened. Do not climb a closed stepladder. Make sure that both sides of the stepladder are locked into place. Ask someone to hold it for you, if possible. Clearly mark and make sure that you can see: Any grab bars or handrails. First and last steps. Where the edge of each step is. Use tools that help you move around (mobility aids) if they are needed. These include: Canes. Walkers. Scooters. Crutches. Turn on the lights when you go into a dark area. Replace any light bulbs as soon as they burn out. Set up your furniture so you have a clear path. Avoid moving your furniture around. If any of your floors are uneven, fix them. If there are any pets around you, be aware of where they are. Review your medicines with your doctor. Some medicines can make you feel dizzy. This can increase your chance of falling. Ask your doctor what other things that you can do to help prevent falls. This information is not intended to replace advice given to you by your health care provider. Make sure you discuss any questions you have with your health care provider. Document Released: 02/08/2009 Document Revised: 09/20/2015 Document Reviewed: 05/19/2014 Elsevier Interactive Patient Education  2017 [employer].

## 2022-02-05 ENCOUNTER — Other Ambulatory Visit: Payer: Self-pay | Admitting: Family Medicine

## 2022-02-21 ENCOUNTER — Telehealth: Payer: Medicare Other | Admitting: Physician Assistant

## 2022-02-21 DIAGNOSIS — J019 Acute sinusitis, unspecified: Secondary | ICD-10-CM | POA: Diagnosis not present

## 2022-02-21 DIAGNOSIS — B9689 Other specified bacterial agents as the cause of diseases classified elsewhere: Secondary | ICD-10-CM | POA: Diagnosis not present

## 2022-02-21 MED ORDER — AMOXICILLIN-POT CLAVULANATE 875-125 MG PO TABS: 1.0000 | ORAL_TABLET | Freq: Two times a day (BID) | ORAL | 0 refills | Status: DC

## 2022-02-21 NOTE — Progress Notes (Signed)
Virtual Visit Consent   [name], you are scheduled for a virtual visit with a [facility] provider today. Just as with appointments in the office, your consent must be obtained to participate. Your consent will be active for this visit and any virtual visit you may have with one of our providers in the next 365 days. If you have a MyChart account, a copy of this consent can be sent to you electronically.  As this is a virtual visit, video technology does not allow for your provider to perform a traditional examination. This may limit your provider's ability to fully assess your condition. If your provider identifies any concerns that need to be evaluated in person or the need to arrange testing (such as labs, EKG, etc.), we will make arrangements to do so. Although advances in technology are sophisticated, we cannot ensure that it will always work on either your end or our end. If the connection with a video visit is poor, the visit may have to be switched to a telephone visit. With either a video or telephone visit, we are not always able to ensure that we have a secure connection.  By engaging in this virtual visit, you consent to the provision of healthcare and authorize for your insurance to be billed (if applicable) for the services provided during this visit. Depending on your insurance coverage, you may receive a charge related to this service.  I need to obtain your verbal consent now. Are you willing to proceed with your visit today? [name] has provided verbal consent on 02/21/2022 for a virtual visit (video or telephone). Mar Daring, PA-C  Date: 02/21/2022 5:35 PM  Virtual Visit via Video Note   IMar Daring, connected with  [name]  ([id number], [birth year]) on 02/21/22 at  5:30 PM EDT by a video-enabled telemedicine application and verified that I am speaking with the correct person using two identifiers.  Location: Patient: Virtual Visit Location  Patient: Home Provider: Virtual Visit Location Provider: Home Office   I discussed the limitations of evaluation and management by telemedicine and the availability of in person appointments. The patient expressed understanding and agreed to proceed.    History of Present Illness: [name] is a 71 y.o. who identifies as a female who was assigned female at birth, and is being seen today for possible sinus infection.  HPI: Sinusitis This is a new problem. The current episode started 1 to 4 weeks ago. The problem has been gradually worsening since onset. Maximum temperature: subjective fevers. The fever has been present for Less than 1 day. The pain is mild. Associated symptoms include chills, congestion, ear pain, headaches and sinus pressure. Pertinent negatives include no coughing, hoarse voice, sneezing or sore throat. (Off balance) Past treatments include acetaminophen (zyrtec, heating pad, cold pack). The treatment provided no relief.      Problems:  Patient Active Problem List   Diagnosis Date Noted   SI (sacroiliac) joint dysfunction 09/24/2021   Somatic dysfunction of spine, sacral 09/24/2021   Impacted cerumen of right ear 06/09/2021   Bradycardia 05/07/2020   Heart murmur 05/07/2020   CKD (chronic kidney disease) stage 3, GFR 30-59 ml/min ([place]) 05/07/2020   B12 deficiency 05/07/2020   Vitamin D deficiency 05/07/2020   Exposure to COVID-19 virus 11/24/2018   Smoker 11/24/2018   Occipital pain 10/23/2017   Arthritis of carpometacarpal (CMC) joints of both thumbs 08/19/2017   Left ear hearing loss 05/20/2017  Diarrhea 04/09/2017   Vertigo 04/09/2017   Arthritis of carpometacarpal [facility]) joint of right thumb 01/29/2017   Right hand pain 01/15/2017   Neck pain on right side 01/15/2017   Cellulitis of right thigh 12/17/2016   Right arm pain 08/07/2016   Hypotension 04/08/2016   Chronic constipation 04/08/2016   Pain in joint of right shoulder 03/15/2016   Allergic  conjunctivitis 01/26/2015   Wheezing 07/27/2014   Wellness examination 02/14/2014   Cough 02/14/2014   Salivary gland calculus 07/20/2011   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   Tick bite, infected 02/26/2011   Encounter for well adult exam with abnormal findings 11/21/2010   Cervicalgia 08/24/2008   PARESTHESIA 08/24/2008   Hyperlipidemia 12/01/2006   Anxiety state 12/01/2006   Depression 12/01/2006   Blepharospasm 12/01/2006   MACULAR DEGENERATION 12/01/2006   Essential hypertension 12/01/2006   GERD 12/01/2006   LOW BACK PAIN 12/01/2006   BONE SPUR 12/01/2006   EXCISION OF GANGLION CYST, WRIST, HX OF 12/01/2006    Allergies:  Allergies  Allergen Reactions   Codeine    Hydrocodone-Acetaminophen    Loratadine-Pseudoephedrine Er    Statins Other (See Comments)    Joint and muscle cramp   Medications:  Current Outpatient Medications:    amoxicillin-clavulanate (AUGMENTIN) 875-125 MG tablet, Take 1 tablet by mouth 2 (two) times daily., Disp: 14 tablet, Rfl: 0   aspirin 81 MG tablet, Take 81 mg by mouth daily., Disp: , Rfl:    buPROPion (WELLBUTRIN XL) 300 MG 24 hr tablet, Take 1 tablet (300 mg total) by mouth daily., Disp: 90 tablet, Rfl: 3   clonazePAM (KLONOPIN) 1 MG tablet, TAKE 1/2 TO 1 TABLET BY MOUTH TWICE DAILY AS NEEDED, Disp: 60 tablet, Rfl: 5   ezetimibe (ZETIA) 10 MG tablet, TAKE 1 TABLET(10 MG) BY MOUTH DAILY (Patient not taking: Reported on 01/31/2022), Disp: 90 tablet, Rfl: 3   famotidine (PEPCID) 20 MG tablet, Take 1 tablet (20 mg total) by mouth 2 (two) times daily., Disp: 180 tablet, Rfl: 3   ibuprofen (ADVIL) 800 MG tablet, Take 1 tablet (800 mg total) by mouth every 8 (eight) hours as needed., Disp: 40 tablet, Rfl: 1   losartan (COZAAR) 100 MG tablet, Take 1 tablet (100 mg total) by mouth daily. (Patient not taking: Reported on 01/31/2022), Disp: 90 tablet, Rfl: 3   meloxicam (MOBIC) 7.5 MG tablet, TAKE 1 TABLET(7.5 MG) BY MOUTH DAILY, Disp: 30 tablet, Rfl: 0    rosuvastatin (CRESTOR) 40 MG tablet, TAKE 1 TABLET BY MOUTH once daily (Patient not taking: Reported on 01/31/2022), Disp: 90 tablet, Rfl: 3   tiZANidine (ZANAFLEX) 2 MG tablet, TAKE 1 TABLET(2 MG) BY MOUTH AT BEDTIME, Disp: 30 tablet, Rfl: 0  Observations/Objective: Patient is well-developed, well-nourished in no acute distress.  Resting comfortably at home.  Head is normocephalic, atraumatic.  No labored breathing.  Speech is clear and coherent with logical content.  Patient is alert and oriented at baseline.    Assessment and Plan: 1. Acute bacterial sinusitis - amoxicillin-clavulanate (AUGMENTIN) 875-125 MG tablet; Take 1 tablet by mouth 2 (two) times daily.  Dispense: 14 tablet; Refill: 0  - Worsening symptoms that have not responded to OTC medications.  - Will give Augmentin - Continue allergy medications.  - Steam and humidifier can help - Stay well hydrated and get plenty of rest.  - Seek in person evaluation if no symptom improvement or if symptoms worsen   Follow Up Instructions: I discussed the assessment and treatment plan with the patient. The  patient was provided an opportunity to ask questions and all were answered. The patient agreed with the plan and demonstrated an understanding of the instructions.  A copy of instructions were sent to the patient via MyChart unless otherwise noted below.    The patient was advised to call back or seek an in-person evaluation if the symptoms worsen or if the condition fails to improve as anticipated.  Time:  I spent 8 minutes with the patient via telehealth technology discussing the above problems/concerns.    Mar Daring, PA-C

## 2022-02-21 NOTE — Patient Instructions (Signed)
Jesus Genera, thank you for joining Mar Daring, PA-C for today's virtual visit.  While this provider is not your primary care provider (PCP), if your PCP is located in our provider database this encounter information will be shared with them immediately following your visit.   [facility] account gives you access to today's visit and all your visits, tests, and labs performed at [facility] " click here if you don't have a [facility] account or go to [email or web address]  Consent: (Patient) [name] provided verbal consent for this virtual visit at the beginning of the encounter.  Current Medications:  Current Outpatient Medications:    amoxicillin-clavulanate (AUGMENTIN) 875-125 MG tablet, Take 1 tablet by mouth 2 (two) times daily., Disp: 14 tablet, Rfl: 0   aspirin 81 MG tablet, Take 81 mg by mouth daily., Disp: , Rfl:    buPROPion (WELLBUTRIN XL) 300 MG 24 hr tablet, Take 1 tablet (300 mg total) by mouth daily., Disp: 90 tablet, Rfl: 3   clonazePAM (KLONOPIN) 1 MG tablet, TAKE 1/2 TO 1 TABLET BY MOUTH TWICE DAILY AS NEEDED, Disp: 60 tablet, Rfl: 5   ezetimibe (ZETIA) 10 MG tablet, TAKE 1 TABLET(10 MG) BY MOUTH DAILY (Patient not taking: Reported on 01/31/2022), Disp: 90 tablet, Rfl: 3   famotidine (PEPCID) 20 MG tablet, Take 1 tablet (20 mg total) by mouth 2 (two) times daily., Disp: 180 tablet, Rfl: 3   ibuprofen (ADVIL) 800 MG tablet, Take 1 tablet (800 mg total) by mouth every 8 (eight) hours as needed., Disp: 40 tablet, Rfl: 1   losartan (COZAAR) 100 MG tablet, Take 1 tablet (100 mg total) by mouth daily. (Patient not taking: Reported on 01/31/2022), Disp: 90 tablet, Rfl: 3   meloxicam (MOBIC) 7.5 MG tablet, TAKE 1 TABLET(7.5 MG) BY MOUTH DAILY, Disp: 30 tablet, Rfl: 0   rosuvastatin (CRESTOR) 40 MG tablet, TAKE 1 TABLET BY MOUTH once daily (Patient not taking: Reported on 01/31/2022), Disp: 90 tablet, Rfl: 3   tiZANidine (ZANAFLEX) 2 MG  tablet, TAKE 1 TABLET(2 MG) BY MOUTH AT BEDTIME, Disp: 30 tablet, Rfl: 0   Medications ordered in this encounter:  Meds ordered this encounter  Medications   amoxicillin-clavulanate (AUGMENTIN) 875-125 MG tablet    Sig: Take 1 tablet by mouth 2 (two) times daily.    Dispense:  14 tablet    Refill:  0    Order Specific Question:   Supervising Provider    Answer:   Chase Picket [id number]     *If you need refills on other medications prior to your next appointment, please contact your pharmacy*  Follow-Up: Call back or seek an in-person evaluation if the symptoms worsen or if the condition fails to improve as anticipated.  [place] [phone]  Other Instructions Sinus Infection, Adult A sinus infection, also called sinusitis, is inflammation of your sinuses. Sinuses are hollow spaces in the bones around your face. Your sinuses are located: Around your eyes. In the middle of your forehead. Behind your nose. In your cheekbones. Mucus normally drains out of your sinuses. When your nasal tissues become inflamed or swollen, mucus can become trapped or blocked. This allows bacteria, viruses, and fungi to grow, which leads to infection. Most infections of the sinuses are caused by a virus. A sinus infection can develop quickly. It can last for up to 4 weeks (acute) or for more than 12 weeks (chronic). A sinus infection often develops after a cold.  What are the causes? This condition is caused by anything that creates swelling in the sinuses or stops mucus from draining. This includes: Allergies. Asthma. Infection from bacteria or viruses. Deformities or blockages in your nose or sinuses. Abnormal growths in the nose (nasal polyps). Pollutants, such as chemicals or irritants in the air. Infection from fungi. This is rare. What increases the risk? You are more likely to develop this condition if you: Have a weak body defense system (immune system). Do a lot of  swimming or diving. Overuse nasal sprays. Smoke. What are the signs or symptoms? The main symptoms of this condition are pain and a feeling of pressure around the affected sinuses. Other symptoms include: Stuffy nose or congestion that makes it difficult to breathe through your nose. Thick yellow or greenish drainage from your nose. Tenderness, swelling, and warmth over the affected sinuses. A cough that may get worse at night. Decreased sense of smell and taste. Extra mucus that collects in the throat or the back of the nose (postnasal drip) causing a sore throat or bad breath. Tiredness (fatigue). Fever. How is this diagnosed? This condition is diagnosed based on: Your symptoms. Your medical history. A physical exam. Tests to find out if your condition is acute or chronic. This may include: Checking your nose for nasal polyps. Viewing your sinuses using a device that has a light (endoscope). Testing for allergies or bacteria. Imaging tests, such as an MRI or CT scan. In rare cases, a bone biopsy may be done to rule out more serious types of fungal sinus disease. How is this treated? Treatment for a sinus infection depends on the cause and whether your condition is chronic or acute. If caused by a virus, your symptoms should go away on their own within 10 days. You may be given medicines to relieve symptoms. They include: Medicines that shrink swollen nasal passages (decongestants). A spray that eases inflammation of the nostrils (topical intranasal corticosteroids). Rinses that help get rid of thick mucus in your nose (nasal saline washes). Medicines that treat allergies (antihistamines). Over-the-counter pain relievers. If caused by bacteria, your health care provider may recommend waiting to see if your symptoms improve. Most bacterial infections will get better without antibiotic medicine. You may be given antibiotics if you have: A severe infection. A weak immune system. If  caused by narrow nasal passages or nasal polyps, surgery may be needed. Follow these instructions at home: Medicines Take, use, or apply over-the-counter and prescription medicines only as told by your health care provider. These may include nasal sprays. If you were prescribed an antibiotic medicine, take it as told by your health care provider. Do not stop taking the antibiotic even if you start to feel better. Hydrate and humidify  Drink enough fluid to keep your urine pale yellow. Staying hydrated will help to thin your mucus. Use a cool mist humidifier to keep the humidity level in your home above 50%. Inhale steam for 10-15 minutes, 3-4 times a day, or as told by your health care provider. You can do this in the bathroom while a hot shower is running. Limit your exposure to cool or dry air. Rest Rest as much as possible. Sleep with your head raised (elevated). Make sure you get enough sleep each night. General instructions  Apply a warm, moist washcloth to your face 3-4 times a day or as told by your health care provider. This will help with discomfort. Use nasal saline washes as often as told by your  health care provider. Wash your hands often with soap and water to reduce your exposure to germs. If soap and water are not available, use hand sanitizer. Do not smoke. Avoid being around people who are smoking (secondhand smoke). Keep all follow-up visits. This is important. Contact a health care provider if: You have a fever. Your symptoms get worse. Your symptoms do not improve within 10 days. Get help right away if: You have a severe headache. You have persistent vomiting. You have severe pain or swelling around your face or eyes. You have vision problems. You develop confusion. Your neck is stiff. You have trouble breathing. These symptoms may be an emergency. Get help right away. Call 911. Do not wait to see if the symptoms will go away. Do not drive yourself to the  hospital. Summary A sinus infection is soreness and inflammation of your sinuses. Sinuses are hollow spaces in the bones around your face. This condition is caused by nasal tissues that become inflamed or swollen. The swelling traps or blocks the flow of mucus. This allows bacteria, viruses, and fungi to grow, which leads to infection. If you were prescribed an antibiotic medicine, take it as told by your health care provider. Do not stop taking the antibiotic even if you start to feel better. Keep all follow-up visits. This is important. This information is not intended to replace advice given to you by your health care provider. Make sure you discuss any questions you have with your health care provider. Document Revised: 03/19/2021 Document Reviewed: 03/19/2021 Elsevier Patient Education  [place].    If you have been instructed to have an in-person evaluation today at a local Urgent Care facility, please use the link below. It will take you to a list of all of our available [facility], including address, phone number and hours of operation. Please do not delay care.  [facility]  If you or a family member do not have a primary care provider, use the link below to schedule a visit and establish care. When you choose a [facility] primary care physician or advanced practice provider, you gain a long-term partner in health. Find a Primary Care Provider  Learn more about [facility]'s in-office and virtual care options: [facility]

## 2022-02-24 ENCOUNTER — Telehealth: Payer: Medicare Other | Admitting: Physician Assistant

## 2022-02-24 DIAGNOSIS — G44209 Tension-type headache, unspecified, not intractable: Secondary | ICD-10-CM | POA: Diagnosis not present

## 2022-02-24 MED ORDER — PREDNISONE 20 MG PO TABS: 40.0000 mg | ORAL_TABLET | Freq: Every day | ORAL | 0 refills | Status: DC

## 2022-02-24 MED ORDER — TIZANIDINE HCL 2 MG PO TABS: ORAL_TABLET | ORAL | 0 refills | Status: DC

## 2022-02-24 NOTE — Progress Notes (Signed)
Virtual Visit Consent   [name], you are scheduled for a virtual visit with a [facility] provider today. Just as with appointments in the office, your consent must be obtained to participate. Your consent will be active for this visit and any virtual visit you may have with one of our providers in the next 365 days. If you have a MyChart account, a copy of this consent can be sent to you electronically.  As this is a virtual visit, video technology does not allow for your provider to perform a traditional examination. This may limit your provider's ability to fully assess your condition. If your provider identifies any concerns that need to be evaluated in person or the need to arrange testing (such as labs, EKG, etc.), we will make arrangements to do so. Although advances in technology are sophisticated, we cannot ensure that it will always work on either your end or our end. If the connection with a video visit is poor, the visit may have to be switched to a telephone visit. With either a video or telephone visit, we are not always able to ensure that we have a secure connection.  By engaging in this virtual visit, you consent to the provision of healthcare and authorize for your insurance to be billed (if applicable) for the services provided during this visit. Depending on your insurance coverage, you may receive a charge related to this service.  I need to obtain your verbal consent now. Are you willing to proceed with your visit today? [name] has provided verbal consent on 02/24/2022 for a virtual visit (video or telephone). Mar Daring, PA-C  Date: 02/24/2022 9:54 AM  Virtual Visit via Video Note   I, Mar Daring, connected with  [name]  ([id number], Dec 05, 1950) on 02/24/22 at 10:00 AM EDT by a video-enabled telemedicine application and verified that I am speaking with the correct person using two identifiers.  Location: Patient: Virtual Visit Location  Patient: Home Provider: Virtual Visit Location Provider: Home Office   I discussed the limitations of evaluation and management by telemedicine and the availability of in person appointments. The patient expressed understanding and agreed to proceed.    History of Present Illness: [name] is a 71 y.o. who identifies as a female who was assigned female at birth, and is being seen today for headache.  HPI: Headache  This is a new problem. The current episode started in the past 7 days (currently on Augmentin for sinus infection; started on 02/21/22; took a tetanus vaccine on Thursday 02/20/22 then by Thursday night the headache started). The problem occurs constantly. The problem has been gradually worsening. The pain is located in the Occipital region. The quality of the pain is described as shooting and stabbing. The patient is experiencing no pain. Associated symptoms include back pain (does have chronic, but none new), nausea, vomiting (improved after stopping augmentin) and weakness. Pertinent negatives include no blurred vision, dizziness, eye pain, eye redness, fever, loss of balance, phonophobia, photophobia, scalp tenderness, sinus pressure, tingling or tinnitus. Associated symptoms comments: Vomiting Friday night into Saturday; Saturday took Zyrtec but did not take Augmentin; Sunday felt better through day, but Sunday night started feeling headache and took a BC powder; Feels better when she is standing; still having headache in occipital area; feels it is side effects to tetanus vaccine. Exacerbated by: position; lying down and sitting. She has tried NSAIDs (BC powder and augmentin (thought was sinus infection)) for the  symptoms. The treatment provided no relief.     Problems:  Patient Active Problem List   Diagnosis Date Noted   SI (sacroiliac) joint dysfunction 09/24/2021   Somatic dysfunction of spine, sacral 09/24/2021   Impacted cerumen of right ear 06/09/2021   Bradycardia  05/07/2020   Heart murmur 05/07/2020   CKD (chronic kidney disease) stage 3, GFR 30-59 ml/min ([place]) 05/07/2020   B12 deficiency 05/07/2020   Vitamin D deficiency 05/07/2020   Exposure to COVID-19 virus 11/24/2018   Smoker 11/24/2018   Occipital pain 10/23/2017   Arthritis of carpometacarpal (CMC) joints of both thumbs 08/19/2017   Left ear hearing loss 05/20/2017   Diarrhea 04/09/2017   Vertigo 04/09/2017   Arthritis of carpometacarpal ([place]) joint of right thumb 01/29/2017   Right hand pain 01/15/2017   Neck pain on right side 01/15/2017   Cellulitis of right thigh 12/17/2016   Right arm pain 08/07/2016   Hypotension 04/08/2016   Chronic constipation 04/08/2016   Pain in joint of right shoulder 03/15/2016   Allergic conjunctivitis 01/26/2015   Wheezing 07/27/2014   Wellness examination 02/14/2014   Cough 02/14/2014   Salivary gland calculus 07/20/2011   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   Tick bite, infected 02/26/2011   Encounter for well adult exam with abnormal findings 11/21/2010   Cervicalgia 08/24/2008   PARESTHESIA 08/24/2008   Hyperlipidemia 12/01/2006   Anxiety state 12/01/2006   Depression 12/01/2006   Blepharospasm 12/01/2006   MACULAR DEGENERATION 12/01/2006   Essential hypertension 12/01/2006   GERD 12/01/2006   LOW BACK PAIN 12/01/2006   BONE SPUR 12/01/2006   EXCISION OF GANGLION CYST, WRIST, HX OF 12/01/2006    Allergies:  Allergies  Allergen Reactions   Codeine    Hydrocodone-Acetaminophen    Loratadine-Pseudoephedrine Er    Statins Other (See Comments)    Joint and muscle cramp   Augmentin [Amoxicillin-Pot Clavulanate] Diarrhea and Nausea And Vomiting   Medications:  Current Outpatient Medications:    predniSONE (DELTASONE) 20 MG tablet, Take 2 tablets (40 mg total) by mouth daily with breakfast., Disp: 10 tablet, Rfl: 0   aspirin 81 MG tablet, Take 81 mg by mouth daily., Disp: , Rfl:    clonazePAM (KLONOPIN) 1 MG tablet, TAKE 1/2 TO 1 TABLET BY MOUTH  TWICE DAILY AS NEEDED, Disp: 60 tablet, Rfl: 5   ezetimibe (ZETIA) 10 MG tablet, TAKE 1 TABLET(10 MG) BY MOUTH DAILY (Patient not taking: Reported on 01/31/2022), Disp: 90 tablet, Rfl: 3   famotidine (PEPCID) 20 MG tablet, Take 1 tablet (20 mg total) by mouth 2 (two) times daily., Disp: 180 tablet, Rfl: 3   ibuprofen (ADVIL) 800 MG tablet, Take 1 tablet (800 mg total) by mouth every 8 (eight) hours as needed., Disp: 40 tablet, Rfl: 1   losartan (COZAAR) 100 MG tablet, Take 1 tablet (100 mg total) by mouth daily. (Patient not taking: Reported on 01/31/2022), Disp: 90 tablet, Rfl: 3   meloxicam (MOBIC) 7.5 MG tablet, TAKE 1 TABLET(7.5 MG) BY MOUTH DAILY, Disp: 30 tablet, Rfl: 0   rosuvastatin (CRESTOR) 40 MG tablet, TAKE 1 TABLET BY MOUTH once daily (Patient not taking: Reported on 01/31/2022), Disp: 90 tablet, Rfl: 3   tiZANidine (ZANAFLEX) 2 MG tablet, TAKE 1 TABLET(2 MG) BY MOUTH AT BEDTIME, Disp: 30 tablet, Rfl: 0  Observations/Objective: Patient is well-developed, well-nourished in no acute distress.  Resting comfortably at home.  Head is normocephalic, atraumatic.  No labored breathing. Speech is clear and coherent with logical content.  Patient is alert and  oriented at baseline.    Assessment and Plan: 1. Tension headache - predniSONE (DELTASONE) 20 MG tablet; Take 2 tablets (40 mg total) by mouth daily with breakfast.  Dispense: 10 tablet; Refill: 0 - tiZANidine (ZANAFLEX) 2 MG tablet; TAKE 1 TABLET(2 MG) BY MOUTH AT BEDTIME  Dispense: 30 tablet; Refill: 0  - Suspect tension headache triggered by recent tetanus vaccination - STOP Augmentin - Prednisone prescribed for inflammation - Tizanidine refilled for muscle spasm/muscle tension (has for chronic back pain; followed by Dr. Creig Hines) - Warm compresses to neck and back of head - Light stretches - Seek in person evaluation if continues to worsen or fails to improve  Follow Up Instructions: I discussed the assessment and treatment  plan with the patient. The patient was provided an opportunity to ask questions and all were answered. The patient agreed with the plan and demonstrated an understanding of the instructions.  A copy of instructions were sent to the patient via MyChart unless otherwise noted below.    The patient was advised to call back or seek an in-person evaluation if the symptoms worsen or if the condition fails to improve as anticipated.  Time:  I spent 11 minutes with the patient via telehealth technology discussing the above problems/concerns.    Mar Daring, PA-C

## 2022-02-24 NOTE — Patient Instructions (Signed)
Jesus Genera, thank you for joining Mar Daring, PA-C for today's virtual visit.  While this provider is not your primary care provider (PCP), if your PCP is located in our provider database this encounter information will be shared with them immediately following your visit.   [facility] account gives you access to today's visit and all your visits, tests, and labs performed at [facility] " click here if you don't have a [facility] account or go to [email or web address]  Consent: (Patient) [name] provided verbal consent for this virtual visit at the beginning of the encounter.  Current Medications:  Current Outpatient Medications:    predniSONE (DELTASONE) 20 MG tablet, Take 2 tablets (40 mg total) by mouth daily with breakfast., Disp: 10 tablet, Rfl: 0   aspirin 81 MG tablet, Take 81 mg by mouth daily., Disp: , Rfl:    clonazePAM (KLONOPIN) 1 MG tablet, TAKE 1/2 TO 1 TABLET BY MOUTH TWICE DAILY AS NEEDED, Disp: 60 tablet, Rfl: 5   ezetimibe (ZETIA) 10 MG tablet, TAKE 1 TABLET(10 MG) BY MOUTH DAILY (Patient not taking: Reported on 01/31/2022), Disp: 90 tablet, Rfl: 3   famotidine (PEPCID) 20 MG tablet, Take 1 tablet (20 mg total) by mouth 2 (two) times daily., Disp: 180 tablet, Rfl: 3   ibuprofen (ADVIL) 800 MG tablet, Take 1 tablet (800 mg total) by mouth every 8 (eight) hours as needed., Disp: 40 tablet, Rfl: 1   losartan (COZAAR) 100 MG tablet, Take 1 tablet (100 mg total) by mouth daily. (Patient not taking: Reported on 01/31/2022), Disp: 90 tablet, Rfl: 3   meloxicam (MOBIC) 7.5 MG tablet, TAKE 1 TABLET(7.5 MG) BY MOUTH DAILY, Disp: 30 tablet, Rfl: 0   rosuvastatin (CRESTOR) 40 MG tablet, TAKE 1 TABLET BY MOUTH once daily (Patient not taking: Reported on 01/31/2022), Disp: 90 tablet, Rfl: 3   tiZANidine (ZANAFLEX) 2 MG tablet, TAKE 1 TABLET(2 MG) BY MOUTH AT BEDTIME, Disp: 30 tablet, Rfl: 0   Medications ordered in this encounter:  Meds  ordered this encounter  Medications   predniSONE (DELTASONE) 20 MG tablet    Sig: Take 2 tablets (40 mg total) by mouth daily with breakfast.    Dispense:  10 tablet    Refill:  0    Order Specific Question:   Supervising Provider    Answer:   Chase Picket [[id number]]   tiZANidine (ZANAFLEX) 2 MG tablet    Sig: TAKE 1 TABLET(2 MG) BY MOUTH AT BEDTIME    Dispense:  30 tablet    Refill:  0    Order Specific Question:   Supervising Provider    Answer:   Chase Picket [id number]     *If you need refills on other medications prior to your next appointment, please contact your pharmacy*  Follow-Up: Call back or seek an in-person evaluation if the symptoms worsen or if the condition fails to improve as anticipated.  [place] [phone]  Other Instructions  Tension Headache, Adult A tension headache is a feeling of pain, pressure, or aching in the head. It is often felt over the front and sides of the head. Tension headaches can last from 30 minutes to several days. What are the causes? The cause of this condition is not known. Sometimes, tension headaches are brought on by stress, worry (anxiety), or depression. Other things that may set them off include: Alcohol. Too much caffeine or caffeine withdrawal. Colds, flu, or sinus infections.  Dental problems. This can include clenching your teeth. Being tired. Holding your head and neck in the same position for a long time, such as while using a computer. Smoking. Arthritis in the neck. What are the signs or symptoms? Feeling pressure around the head. A dull ache in the head. Pain over the front and sides of the head. Feeling sore or tender in the muscles of the head, neck, and shoulders. How is this treated? This condition may be treated with lifestyle changes and with medicines that help relieve symptoms. Follow these instructions at home: Managing pain Take over-the-counter and prescription medicines  only as told by your doctor. When you have a headache, lie down in a dark, quiet room. If told, put ice on your head and neck. To do this: Put ice in a plastic bag. Place a towel between your skin and the bag. Leave the ice on for 20 minutes, 2-3 times a day. Take off the ice if your skin turns bright red. This is very important. If you cannot feel pain, heat, or cold, you have a greater risk of damage to the area. If told, put heat on the back of your neck. Do this as often as told by your doctor. Use the heat source that your doctor recommends, such as a moist heat pack or a heating pad. Place a towel between your skin and the heat source. Leave the heat on for 20-30 minutes. Take off the heat if your skin turns bright red. This is very important. If you cannot feel pain, heat, or cold, you have a greater risk of getting burned. Eating and drinking Eat meals on a regular schedule. If you drink alcohol: Limit how much you have to: 0-1 drink a day for women who are not pregnant. 0-2 drinks a day for men. Know how much alcohol is in your drink. In the [place] one drink equals one 12 oz bottle of beer (355 mL), one 5 oz glass of wine (148 mL), or one 1 oz glass of hard liquor (44 mL). Drink enough fluid to keep your pee (urine) pale yellow. Do not use a lot of caffeine, or stop using caffeine. Lifestyle Get 7-9 hours of sleep each night. Or get the amount of sleep that your doctor tells you to. At bedtime, keep computers, phones, and tablets out of your room. Find ways to lessen your stress. This may include: Exercise. Deep breathing. Yoga. Listening to music. Thinking positive thoughts. Sit up straight. Try to relax your muscles. Do not smoke or use any products that contain nicotine or tobacco. If you need help quitting, ask your doctor. General instructions  Avoid things that can bring on headaches. Keep a headache journal to see what may bring on headaches. For example, write  down: What you eat and drink. How much sleep you get. Any change to your diet or medicines. Keep all follow-up visits. Contact a doctor if: Your headache does not get better. Your headache comes back. You have a headache, and sounds, light, or smells bother you. You feel like you may vomit, or you vomit. Your stomach hurts. Get help right away if: You all of a sudden get a very bad headache with any of these things: A stiff neck. Feeling like you may vomit. Vomiting. Feeling mixed up (confused). Feeling weak in one part or one side of your body. Having trouble seeing or speaking, or both. Feeling short of breath. A rash. Feeling very sleepy. Pain in your eye or  ear. Trouble walking or balancing. Feeling like you will faint, or you faint. Summary A tension headache is pain, pressure, or aching in your head. Tension headaches can last from 30 minutes to several days. Lifestyle changes and medicines may help relieve pain. This information is not intended to replace advice given to you by your health care provider. Make sure you discuss any questions you have with your health care provider. Document Revised: 01/12/2020 Document Reviewed: 01/12/2020 Elsevier Patient Education  [place].    If you have been instructed to have an in-person evaluation today at a local Urgent Care facility, please use the link below. It will take you to a list of all of our available [facility], including address, phone number and hours of operation. Please do not delay care.  [facility]  If you or a family member do not have a primary care provider, use the link below to schedule a visit and establish care. When you choose a [facility] primary care physician or advanced practice provider, you gain a long-term partner in health. Find a Primary Care Provider  Learn more about [facility]'s in-office and virtual care options: [facility]

## 2022-03-05 ENCOUNTER — Ambulatory Visit (INDEPENDENT_AMBULATORY_CARE_PROVIDER_SITE_OTHER): Payer: Medicare Other | Admitting: Internal Medicine

## 2022-03-05 VITALS — BP 148/80 | HR 72 | Temp 98.2°F | Ht 63.0 in | Wt 120.0 lb

## 2022-03-05 DIAGNOSIS — F419 Anxiety disorder, unspecified: Secondary | ICD-10-CM | POA: Diagnosis not present

## 2022-03-05 DIAGNOSIS — I10 Essential (primary) hypertension: Secondary | ICD-10-CM | POA: Diagnosis not present

## 2022-03-05 DIAGNOSIS — E559 Vitamin D deficiency, unspecified: Secondary | ICD-10-CM

## 2022-03-05 DIAGNOSIS — E782 Mixed hyperlipidemia: Secondary | ICD-10-CM | POA: Diagnosis not present

## 2022-03-05 DIAGNOSIS — F17200 Nicotine dependence, unspecified, uncomplicated: Secondary | ICD-10-CM

## 2022-03-05 DIAGNOSIS — F32A Depression, unspecified: Secondary | ICD-10-CM | POA: Diagnosis not present

## 2022-03-05 MED ORDER — LOSARTAN POTASSIUM 100 MG PO TABS: 100.0000 mg | ORAL_TABLET | Freq: Every day | ORAL | 3 refills | Status: DC

## 2022-03-05 MED ORDER — FAMOTIDINE 20 MG PO TABS: 20.0000 mg | ORAL_TABLET | Freq: Two times a day (BID) | ORAL | 3 refills | Status: DC

## 2022-03-05 MED ORDER — FLUOXETINE HCL 20 MG PO CAPS: 20.0000 mg | ORAL_CAPSULE | Freq: Every day | ORAL | 3 refills | Status: DC

## 2022-03-05 MED ORDER — REPATHA SURECLICK 140 MG/ML ~~LOC~~ SOAJ: SUBCUTANEOUS | 11 refills | Status: DC

## 2022-03-05 MED ORDER — BUPROPION HCL ER (XL) 300 MG PO TB24: 300.0000 mg | ORAL_TABLET | Freq: Every day | ORAL | 3 refills | Status: DC

## 2022-03-05 NOTE — Patient Instructions (Addendum)
Ok to restart the prozac 20 mg and wellbutrin XL 300 mg  .Please take all new medication as prescribed - the repatha shot for cholesterol  Please continue all other medications as before, and refills have been done if requested.  Please have the pharmacy call with any other refills you may need.  Please keep your appointments with your specialists as you may have planned  Please make an Appointment to return in 3 months, or sooner if needed

## 2022-03-05 NOTE — Progress Notes (Unsigned)
Patient ID: [name], female   DOB: 22-Jan-1951, 71 y.o.   MRN: [id number]        Chief Complaint: follow up recent HA with URI now improved but also increased anxiety, htn,, smoking, hld, low vit d       HPI:  [name] is a 71 y.o. female here with c/o occipital headache.  None today but wanted to keep the appt. Had no fever, but had chills and sweats.     S/p Tdap 2 wks ago with dialy posterior occipital pain every day except for 1.; seen at [facility] with initial augmentin but pt stopped due to GI upset; called back and tried short low dose course of prednisone, also tried tizanidine per Dr Tamala Julian but no help.  In last 2 days, now with bilateral ear pressure and runny nose but all resolved today with zyrtec.  Has difficult home life, 3 young grandsons but also daughter, after boyfriend removed from the home by his parole officer.  Denies worsening depressive symptoms, suicidal ideation, or panic; has ongoing anxiety, asks for restart prozac, and wellbutrin.  Does not want counseling.  BP at home has been < 140/90.  Still smoking, not ready to quit.  But did quit all her meds, statin caused muscle pain. Wt Readings from Last 3 Encounters:  03/05/22 120 lb (54.4 kg)  01/31/22 119 lb (54 kg)  01/09/22 118 lb (53.5 kg)   BP Readings from Last 3 Encounters:  03/05/22 (!) 148/80  01/09/22 (!) 146/80  10/17/21 (!) 174/88         Past Medical History:  Diagnosis Date   ANXIETY 12/01/2006   Arthritis    Blepharospasm 12/01/2006   BONE SPUR 12/01/2006   Cellulitis and abscess of oral soft tissues 05/01/2008   Cervicalgia 08/24/2008   COLD SORE 05/30/2008   DEPRESSION 12/01/2006   EXCISION OF GANGLION CYST, WRIST, HX OF 12/01/2006   GERD 12/01/2006   HYPERLIPIDEMIA 12/01/2006   HYPERTENSION, BORDERLINE 12/01/2006   LOW BACK PAIN 12/01/2006   MACULAR DEGENERATION 12/01/2006   OTITIS MEDIA, ACUTE, LEFT 05/30/2008   PARESTHESIA 08/24/2008   SINUSITIS- ACUTE-NOS 01/07/2010   Past Surgical History:  Procedure Laterality  Date   ABDOMINAL HYSTERECTOMY     blepharospasm     bone spur     removal right foot   BREAST LUMPECTOMY     right   GANGLION CYST EXCISION     H/O Macular degeneration      reports that she has been smoking. She has never used smokeless tobacco. She reports that she does not drink alcohol and does not use drugs. family history includes Diabetes in her daughter; Heart attack in her father; Heart disease in her father; Hypertension in her mother; Pulmonary fibrosis in her mother. Allergies  Allergen Reactions   Codeine    Hydrocodone-Acetaminophen    Loratadine-Pseudoephedrine Er    Statins Other (See Comments)    Joint and muscle cramp   Augmentin [Amoxicillin-Pot Clavulanate] Diarrhea and Nausea And Vomiting   Current Outpatient Medications on File Prior to Visit  Medication Sig Dispense Refill   aspirin 81 MG tablet Take 81 mg by mouth daily.     clonazePAM (KLONOPIN) 1 MG tablet TAKE 1/2 TO 1 TABLET BY MOUTH TWICE DAILY AS NEEDED 60 tablet 5   ibuprofen (ADVIL) 800 MG tablet Take 1 tablet (800 mg total) by mouth every 8 (eight) hours as needed. 40 tablet 1   meloxicam (MOBIC) 7.5 MG tablet TAKE  1 TABLET(7.5 MG) BY MOUTH DAILY 30 tablet 0   No current facility-administered medications on file prior to visit.        ROS:  All others reviewed and negative.  Objective        PE:  BP (!) 148/80 (BP Location: Left Arm, Patient Position: Sitting, Cuff Size: Large)   Pulse 72   Temp 98.2 F (36.8 C) (Oral)   Ht '5\' 3"'$  (1.6 m)   Wt 120 lb (54.4 kg)   SpO2 98%   BMI 21.26 kg/m                 Constitutional: Pt appears in NAD               HENT: Head: NCAT.                Right Ear: External ear normal.                 Left Ear: External ear normal.                Eyes: . Pupils are equal, round, and reactive to light. Conjunctivae and EOM are normal               Nose: without d/c or deformity               Neck: Neck supple. Gross normal ROM               Cardiovascular:  Normal rate and regular rhythm.                 Pulmonary/Chest: Effort normal and breath sounds without rales or wheezing.                Abd:  Soft, NT, ND, + BS, no organomegaly               Neurological: Pt is alert. At baseline orientation, motor grossly intact               Skin: Skin is warm. No rashes, no other new lesions, LE edema - none               Psychiatric: Pt behavior is normal without agitation , moderate nervous  Micro: none  Cardiac tracings I have personally interpreted today:  none  Pertinent Radiological findings (summarize): none   Lab Results  Component Value Date   WBC 14.1 (H) 06/06/2021   HGB 13.9 06/06/2021   HCT 42.5 06/06/2021   PLT 237.0 06/06/2021   GLUCOSE 85 06/06/2021   CHOL 189 06/06/2021   TRIG 241.0 (H) 06/06/2021   HDL 50.40 06/06/2021   LDLDIRECT 106.0 06/06/2021   ALT 10 06/06/2021   AST 15 06/06/2021   NA 139 06/06/2021   K 4.0 06/06/2021   CL 101 06/06/2021   CREATININE 1.18 06/06/2021   BUN 10 06/06/2021   CO2 31 06/06/2021   TSH 2.27 06/06/2021   HGBA1C 5.7 06/06/2021   Assessment/Plan:  [name] is a 71 y.o. White or Caucasian [1] female with  has a past medical history of ANXIETY (12/01/2006), Arthritis, Blepharospasm (12/01/2006), BONE SPUR (12/01/2006), Cellulitis and abscess of oral soft tissues (05/01/2008), Cervicalgia (08/24/2008), COLD SORE (05/30/2008), DEPRESSION (12/01/2006), EXCISION OF GANGLION CYST, WRIST, HX OF (12/01/2006), GERD (12/01/2006), HYPERLIPIDEMIA (12/01/2006), HYPERTENSION, BORDERLINE (12/01/2006), LOW BACK PAIN (12/01/2006), MACULAR DEGENERATION (12/01/2006), OTITIS MEDIA, ACUTE, LEFT (05/30/2008), PARESTHESIA (08/24/2008), and SINUSITIS- ACUTE-NOS (01/07/2010).  Smoker Pt counsled to quit, pt not  ready  Vitamin D deficiency Last vitamin D Lab Results  Component Value Date   VD25OH 26.98 (L) 06/06/2021   Low reminded to start oral replacement   Essential hypertension BP Readings from Last 3 Encounters:  03/05/22  (!) 148/80  01/09/22 (!) 146/80  10/17/21 (!) 174/88   Uncontrolled, pt states controlled at home, pt to continue medical treatment losartan 100 mg qd and continue to f/u at home   Hyperlipidemia No results found for: "[place]" Uncontrolled,, pt to start repatha asd, low chol diet   Anxiety and depression Worsening recently, for restart prozac 40 mg qd, wellbutrin xl 300 mg, declines need for referral for counseling  Followup: Return in about 3 months (around 06/05/2022).  Cathlean Cower, MD 03/06/2022 8:38 PM [facility]

## 2022-03-06 ENCOUNTER — Encounter: Payer: Self-pay | Admitting: Internal Medicine

## 2022-03-06 ENCOUNTER — Telehealth: Payer: Self-pay | Admitting: Internal Medicine

## 2022-03-06 DIAGNOSIS — F32A Depression, unspecified: Secondary | ICD-10-CM | POA: Insufficient documentation

## 2022-03-06 MED ORDER — FLUOXETINE HCL 40 MG PO CAPS: 40.0000 mg | ORAL_CAPSULE | Freq: Every day | ORAL | 3 refills | Status: DC

## 2022-03-06 NOTE — Assessment & Plan Note (Signed)
Worsening recently, for restart prozac 40 mg qd, wellbutrin xl 300 mg, declines need for referral for counseling

## 2022-03-06 NOTE — Assessment & Plan Note (Signed)
Pt counsled to quit, pt not ready °

## 2022-03-06 NOTE — Telephone Encounter (Signed)
When she was in the office yesterday she told Dr. Jenny Reichmann the wrong prescription strength - she needs 40 mg. Instead of the 20.Marland Kitchen   She has a whole bottle of the 40 left so she does not need any called in right now.  - She just needs him to note that she is on the 40 mg.

## 2022-03-06 NOTE — Assessment & Plan Note (Signed)
No results found for: "[place]" Uncontrolled,, pt to start repatha asd, low chol diet

## 2022-03-06 NOTE — Assessment & Plan Note (Signed)
Last vitamin D Lab Results  Component Value Date   VD25OH 26.98 (L) 06/06/2021   Low reminded to start oral replacement

## 2022-03-06 NOTE — Assessment & Plan Note (Addendum)
BP Readings from Last 3 Encounters:  03/05/22 (!) 148/80  01/09/22 (!) 146/80  10/17/21 (!) 174/88   Uncontrolled, pt states controlled at home, pt to continue medical treatment losartan 100 mg qd and continue to f/u at home

## 2022-03-06 NOTE — Telephone Encounter (Signed)
The rx that patient needs is the repatha Allison told her it would be $500 because we had not sent in all the required information.  It must need a pre authorization - Please send this PA in.  Dr. Jenny Reichmann told patient she should be able to get it for $30.00.  Please Advise.

## 2022-03-10 ENCOUNTER — Telehealth: Payer: Self-pay | Admitting: Internal Medicine

## 2022-03-10 ENCOUNTER — Telehealth: Payer: Self-pay

## 2022-03-10 NOTE — Telephone Encounter (Signed)
Pt has been approved Repatha SureClick '140MG'$ /ML auto-injectors through  09/08/2022.

## 2022-03-10 NOTE — Telephone Encounter (Signed)
Pt PA is started and send to plan, waiting for approval  (Key: [id number])

## 2022-03-10 NOTE — Telephone Encounter (Signed)
Pt called checking status of Prior Authorization. Pt will check pharmacy today and let provider know if they are not able to fill the prescription.

## 2022-03-11 NOTE — Telephone Encounter (Signed)
PA approved for repatha sureclick

## 2022-03-12 ENCOUNTER — Encounter: Payer: Self-pay | Admitting: Internal Medicine

## 2022-03-14 ENCOUNTER — Other Ambulatory Visit: Payer: Self-pay | Admitting: Family Medicine

## 2022-03-14 NOTE — Telephone Encounter (Signed)
Please advise if patient was instructed to restart these meds.

## 2022-03-17 ENCOUNTER — Other Ambulatory Visit: Payer: Self-pay

## 2022-03-17 MED ORDER — MELOXICAM 7.5 MG PO TABS: ORAL_TABLET | ORAL | 0 refills | Status: DC

## 2022-04-08 NOTE — Progress Notes (Unsigned)
[facility] [place] [facility] Phone: [phone] Subjective:   [name], am serving as a scribe for Dr. Hulan Saas.  I'm seeing this patient by the request  of:  Biagio Borg, MD  CC: Back and neck pain follow-up  [id number]  [name] is a 71 y.o. female coming in with complaint of back and neck pain. OMT 01/09/2022. Patient states that she has intermittent pain since last visit. Pain in lumbar spine and thoracic spine pain on the L.   Medications patient has been prescribed: Meloxicam  Taking:         Reviewed prior external information including notes and imaging from previsou exam, outside providers and external EMR if available.   As well as notes that were available from care everywhere and other healthcare systems.  Past medical history, social, surgical and family history all reviewed in electronic medical record.  No pertanent information unless stated regarding to the chief complaint.   Past Medical History:  Diagnosis Date   ANXIETY 12/01/2006   Arthritis    Blepharospasm 12/01/2006   BONE SPUR 12/01/2006   Cellulitis and abscess of oral soft tissues 05/01/2008   Cervicalgia 08/24/2008   COLD SORE 05/30/2008   DEPRESSION 12/01/2006   EXCISION OF GANGLION CYST, WRIST, HX OF 12/01/2006   GERD 12/01/2006   HYPERLIPIDEMIA 12/01/2006   HYPERTENSION, BORDERLINE 12/01/2006   LOW BACK PAIN 12/01/2006   MACULAR DEGENERATION 12/01/2006   OTITIS MEDIA, ACUTE, LEFT 05/30/2008   PARESTHESIA 08/24/2008   SINUSITIS- ACUTE-NOS 01/07/2010    Allergies  Allergen Reactions   Codeine    Hydrocodone-Acetaminophen    Loratadine-Pseudoephedrine Er    Statins Other (See Comments)    Joint and muscle cramp   Augmentin [Amoxicillin-Pot Clavulanate] Diarrhea and Nausea And Vomiting     Review of Systems:  No headache, visual changes, nausea, vomiting, diarrhea, constipation, dizziness, abdominal pain, skin rash, fevers, chills,  night sweats, weight loss, swollen lymph nodes, body aches, joint swelling, chest pain, shortness of breath, mood changes. POSITIVE muscle aches  Objective  Blood pressure (!) 142/88, pulse 73, height '5\' 3"'$  (1.6 m), SpO2 98 %.   General: No apparent distress alert and oriented x3 mood and affect normal, dressed appropriately.  HEENT: Pupils equal, extraocular movements intact  Respiratory: Patient's speak in full sentences and does not appear short of breath  Cardiovascular: No lower extremity edema, non tender, no erythema  Low back exam does have some loss of lordosis.  Still tenderness over the same joint right greater than left.  Negative straight leg test noted to be low. Neck exam still has significant tightness noted on the right side of the neck.  He does have some limited sidebending on the right  Osteopathic findings  C2 flexed rotated and side bent right C6 flexed rotated and side bent right T4 extended rotated and side bent right inhaled rib L1 flexed rotated and side bent right Sacrum right on right       Assessment and Plan:  SI (sacroiliac) joint dysfunction Overall seems to be doing better with the decision conservative therapy with the osteopathic manipulation.  Still has some tightness noted on the right side of the neck as well going to the right shoulder.  Discussed with patient about icing regimen and home exercises.  Patient will follow-up again in 3 months    Nonallopathic problems  Decision today to treat with OMT was based on Physical Exam  After verbal consent patient was treated with HVLA, ME, FPR techniques in cervical, rib, thoracic, lumbar, and sacral  areas  Patient tolerated the procedure well with improvement in symptoms  Patient given exercises, stretches and lifestyle modifications  See medications in patient instructions if given  Patient will follow up in 12 weeks     The above documentation has been reviewed and is accurate and  complete Kim Pulley, DO         Note: This dictation was prepared with Dragon dictation along with smaller phrase technology. Any transcriptional errors that result from this process are unintentional.

## 2022-04-10 ENCOUNTER — Ambulatory Visit (INDEPENDENT_AMBULATORY_CARE_PROVIDER_SITE_OTHER): Payer: Medicare Other | Admitting: Family Medicine

## 2022-04-10 VITALS — BP 142/88 | HR 73 | Ht 63.0 in

## 2022-04-10 DIAGNOSIS — M9908 Segmental and somatic dysfunction of rib cage: Secondary | ICD-10-CM

## 2022-04-10 DIAGNOSIS — M9902 Segmental and somatic dysfunction of thoracic region: Secondary | ICD-10-CM | POA: Diagnosis not present

## 2022-04-10 DIAGNOSIS — M533 Sacrococcygeal disorders, not elsewhere classified: Secondary | ICD-10-CM | POA: Diagnosis not present

## 2022-04-10 DIAGNOSIS — M542 Cervicalgia: Secondary | ICD-10-CM | POA: Diagnosis not present

## 2022-04-10 DIAGNOSIS — M9901 Segmental and somatic dysfunction of cervical region: Secondary | ICD-10-CM | POA: Diagnosis not present

## 2022-04-10 DIAGNOSIS — M9904 Segmental and somatic dysfunction of sacral region: Secondary | ICD-10-CM

## 2022-04-10 DIAGNOSIS — M9903 Segmental and somatic dysfunction of lumbar region: Secondary | ICD-10-CM

## 2022-04-10 NOTE — Patient Instructions (Signed)
Handicap placard Continue medicines See me in 3 months

## 2022-04-10 NOTE — Assessment & Plan Note (Signed)
Overall seems to be doing better with the decision conservative therapy with the osteopathic manipulation.  Still has some tightness noted on the right side of the neck as well going to the right shoulder.  Discussed with patient about icing regimen and home exercises.  Patient will follow-up again in 3 months

## 2022-04-14 ENCOUNTER — Other Ambulatory Visit: Payer: Self-pay | Admitting: Family Medicine

## 2022-04-16 ENCOUNTER — Encounter: Payer: Self-pay | Admitting: Internal Medicine

## 2022-05-01 ENCOUNTER — Encounter: Payer: Self-pay | Admitting: Internal Medicine

## 2022-05-01 ENCOUNTER — Ambulatory Visit (INDEPENDENT_AMBULATORY_CARE_PROVIDER_SITE_OTHER): Payer: Medicare Other | Admitting: Internal Medicine

## 2022-05-01 VITALS — BP 158/80 | HR 63 | Temp 98.3°F | Ht 63.0 in | Wt 122.6 lb

## 2022-05-01 DIAGNOSIS — E538 Deficiency of other specified B group vitamins: Secondary | ICD-10-CM

## 2022-05-01 DIAGNOSIS — R739 Hyperglycemia, unspecified: Secondary | ICD-10-CM | POA: Diagnosis not present

## 2022-05-01 DIAGNOSIS — I10 Essential (primary) hypertension: Secondary | ICD-10-CM

## 2022-05-01 DIAGNOSIS — Z0001 Encounter for general adult medical examination with abnormal findings: Secondary | ICD-10-CM | POA: Diagnosis not present

## 2022-05-01 DIAGNOSIS — E782 Mixed hyperlipidemia: Secondary | ICD-10-CM | POA: Diagnosis not present

## 2022-05-01 DIAGNOSIS — Z1211 Encounter for screening for malignant neoplasm of colon: Secondary | ICD-10-CM

## 2022-05-01 DIAGNOSIS — N1831 Chronic kidney disease, stage 3a: Secondary | ICD-10-CM

## 2022-05-01 DIAGNOSIS — E559 Vitamin D deficiency, unspecified: Secondary | ICD-10-CM

## 2022-05-01 LAB — HEPATIC FUNCTION PANEL
ALT: 11 U/L (ref 0–35)
AST: 16 U/L (ref 0–37)
Albumin: 4.2 g/dL (ref 3.5–5.2)
Alkaline Phosphatase: 70 U/L (ref 39–117)
Bilirubin, Direct: 0.1 mg/dL (ref 0.0–0.3)
Total Bilirubin: 0.4 mg/dL (ref 0.2–1.2)
Total Protein: 6.8 g/dL (ref 6.0–8.3)

## 2022-05-01 LAB — LIPID PANEL
Cholesterol: 171 mg/dL (ref 0–200)
HDL: 65.1 mg/dL (ref >39.00)
LDL Cholesterol: 79 mg/dL (ref 0–99)
NonHDL: 105.41
Total CHOL/HDL Ratio: 3
Triglycerides: 133 mg/dL (ref 0.0–149.0)
VLDL: 26.6 mg/dL (ref 0.0–40.0)

## 2022-05-01 LAB — TSH: TSH: 2.5 u[IU]/mL (ref 0.35–5.50)

## 2022-05-01 LAB — CBC WITH DIFFERENTIAL/PLATELET
Basophils Absolute: 0.1 10*3/uL (ref 0.0–0.1)
Basophils Relative: 0.7 % (ref 0.0–3.0)
Eosinophils Absolute: 0.1 10*3/uL (ref 0.0–0.7)
Eosinophils Relative: 1.2 % (ref 0.0–5.0)
HCT: 42.3 % (ref 36.0–46.0)
Hemoglobin: 14.2 g/dL (ref 12.0–15.0)
Lymphocytes Relative: 26.1 % (ref 12.0–46.0)
Lymphs Abs: 2.8 10*3/uL (ref 0.7–4.0)
MCHC: 33.6 g/dL (ref 30.0–36.0)
MCV: 89.4 fl (ref 78.0–100.0)
Monocytes Absolute: 0.7 10*3/uL (ref 0.1–1.0)
Monocytes Relative: 6.4 % (ref 3.0–12.0)
Neutro Abs: 7.1 10*3/uL (ref 1.4–7.7)
Neutrophils Relative %: 65.6 % (ref 43.0–77.0)
Platelets: 285 10*3/uL (ref 150.0–400.0)
RBC: 4.74 Mil/uL (ref 3.87–5.11)
RDW: 13.3 % (ref 11.5–15.5)
WBC: 10.9 10*3/uL — ABNORMAL HIGH (ref 4.0–10.5)

## 2022-05-01 LAB — URINALYSIS, ROUTINE W REFLEX MICROSCOPIC
Bilirubin Urine: NEGATIVE
Hgb urine dipstick: NEGATIVE
Ketones, ur: NEGATIVE
Leukocytes,Ua: NEGATIVE
Nitrite: NEGATIVE
RBC / HPF: NONE SEEN (ref 0–?)
Specific Gravity, Urine: 1.015 (ref 1.000–1.030)
Total Protein, Urine: NEGATIVE
Urine Glucose: NEGATIVE
Urobilinogen, UA: 0.2 (ref 0.0–1.0)
pH: 6 (ref 5.0–8.0)

## 2022-05-01 LAB — BASIC METABOLIC PANEL
BUN: 13 mg/dL (ref 6–23)
CO2: 29 mEq/L (ref 19–32)
Calcium: 9.5 mg/dL (ref 8.4–10.5)
Chloride: 101 mEq/L (ref 96–112)
Creatinine, Ser: 1.19 mg/dL (ref 0.40–1.20)
GFR: 46.6 mL/min — ABNORMAL LOW (ref >60.00)
Glucose, Bld: 91 mg/dL (ref 70–99)
Potassium: 4.3 mEq/L (ref 3.5–5.1)
Sodium: 137 mEq/L (ref 135–145)

## 2022-05-01 LAB — VITAMIN B12: Vitamin B-12: 418 pg/mL (ref 211–911)

## 2022-05-01 LAB — VITAMIN D 25 HYDROXY (VIT D DEFICIENCY, FRACTURES): VITD: 36.65 ng/mL (ref 30.00–100.00)

## 2022-05-01 LAB — HEMOGLOBIN A1C: Hgb A1c MFr Bld: 5.4 % (ref 4.6–6.5)

## 2022-05-01 MED ORDER — AMLODIPINE BESYLATE 10 MG PO TABS: 10.0000 mg | ORAL_TABLET | Freq: Every day | ORAL | 3 refills | Status: DC

## 2022-05-01 NOTE — Assessment & Plan Note (Signed)
Lab Results  Component Value Date   VITAMINB12 418 05/01/2022   Stable, cont oral replacement - b12 1000 mcg qd

## 2022-05-01 NOTE — Assessment & Plan Note (Signed)
Last vitamin D Lab Results  Component Value Date   VD25OH 36.65 05/01/2022   Low, to start oral replacement

## 2022-05-01 NOTE — Assessment & Plan Note (Signed)
BP Readings from Last 3 Encounters:  05/01/22 (!) 158/80  04/10/22 (!) 142/88  03/05/22 (!) 148/80   Uncontrolled,, pt to continue medical treatment losartan 100 mg qd, and add amlodipine 10 mg qd

## 2022-05-01 NOTE — Progress Notes (Signed)
Patient ID: [name], female   DOB: 02/25/1951, 72 y.o.   MRN: [id number]         Chief Complaint:: wellness exam and Medication Problem (Doesn't feel like need to take wellbutrin and prozac, makes her shakey./Takes half a klononpin to help with the shakes.)  , htn, hld, ckd3a, low b12 and Vit D, smoker       HPI:  [name] is a 72 y.o. female here for wellness exam; declines colonoscopy but ok for cologuard, has been sched for mammogram and dxa soon, declines covid booster, flu shot o/w up to date                        Also Pt denies chest pain, increased sob or doe, wheezing, orthopnea, PND, increased LE swelling, palpitations, dizziness or syncope.   Pt denies polydipsia, polyuria, or new focal neuro s/s.    Pt denies fever, wt loss, night sweats, loss of appetite, or other constitutional symptoms  BP remains elevated at home.  Tolerating repatha quite well and very pleased.  Pt states no longer feels she needs prozac or wellbutrin, as her largest social stressor with her daughter and children living with her, is better now that they have moved to a different building on the property.  Still smoking, not ready to quit.   Wt Readings from Last 3 Encounters:  05/01/22 122 lb 9.6 oz (55.6 kg)  03/05/22 120 lb (54.4 kg)  01/31/22 119 lb (54 kg)   BP Readings from Last 3 Encounters:  05/01/22 (!) 158/80  04/10/22 (!) 142/88  03/05/22 (!) 148/80   Immunization History  Administered Date(s) Administered   DTaP 04/04/2022   Fluad Quad(high Dose 65+) 05/07/2020, 06/06/2021   Influenza Nasal 01/19/2016   Influenza-Unspecified 01/26/2014, 02/02/2015, 03/08/2018   PNEUMOCOCCAL CONJUGATE-20 06/06/2021   Td 08/23/2009   Zoster Recombinat (Shingrix) 07/29/2021   Zoster, Unspecified 04/11/2022, 04/11/2022   There are no preventive care reminders to display for this patient.     Past Medical History:  Diagnosis Date   ANXIETY 12/01/2006   Arthritis    Blepharospasm 12/01/2006   BONE  SPUR 12/01/2006   Cellulitis and abscess of oral soft tissues 05/01/2008   Cervicalgia 08/24/2008   COLD SORE 05/30/2008   DEPRESSION 12/01/2006   EXCISION OF GANGLION CYST, WRIST, HX OF 12/01/2006   GERD 12/01/2006   HYPERLIPIDEMIA 12/01/2006   HYPERTENSION, BORDERLINE 12/01/2006   LOW BACK PAIN 12/01/2006   MACULAR DEGENERATION 12/01/2006   OTITIS MEDIA, ACUTE, LEFT 05/30/2008   PARESTHESIA 08/24/2008   SINUSITIS- ACUTE-NOS 01/07/2010   Past Surgical History:  Procedure Laterality Date   ABDOMINAL HYSTERECTOMY     blepharospasm     bone spur     removal right foot   BREAST LUMPECTOMY     right   GANGLION CYST EXCISION     H/O Macular degeneration      reports that she has been smoking. She has never used smokeless tobacco. She reports that she does not drink alcohol and does not use drugs. family history includes Diabetes in her daughter; Heart attack in her father; Heart disease in her father; Hypertension in her mother; Pulmonary fibrosis in her mother. Allergies  Allergen Reactions   Codeine    Hydrocodone-Acetaminophen    Loratadine-Pseudoephedrine Er    Statins Other (See Comments)    Joint and muscle cramp   Augmentin [Amoxicillin-Pot Clavulanate] Diarrhea and Nausea And Vomiting   Current Outpatient  Medications on File Prior to Visit  Medication Sig Dispense Refill   aspirin 81 MG tablet Take 81 mg by mouth daily.     clonazePAM (KLONOPIN) 1 MG tablet TAKE 1/2 TO 1 TABLET BY MOUTH TWICE DAILY AS NEEDED 60 tablet 5   Evolocumab (REPATHA SURECLICK) 329 MG/ML SOAJ Take 1 injection every 2 weeks 2 mL 11   losartan (COZAAR) 100 MG tablet Take 1 tablet (100 mg total) by mouth daily. 90 tablet 3   meloxicam (MOBIC) 7.5 MG tablet TAKE 1 TABLET(7.5 MG) BY MOUTH DAILY 30 tablet 0   No current facility-administered medications on file prior to visit.        ROS:  All others reviewed and negative.  Objective        PE:  BP (!) 158/80 (BP Location: Left Arm, Patient Position: Sitting, Cuff  Size: Normal)   Pulse 63   Temp 98.3 F (36.8 C) (Oral)   Ht '5\' 3"'$  (1.6 m)   Wt 122 lb 9.6 oz (55.6 kg)   SpO2 96%   BMI 21.72 kg/m                 Constitutional: Pt appears in NAD               HENT: Head: NCAT.                Right Ear: External ear normal.                 Left Ear: External ear normal.                Eyes: . Pupils are equal, round, and reactive to light. Conjunctivae and EOM are normal               Nose: without d/c or deformity               Neck: Neck supple. Gross normal ROM               Cardiovascular: Normal rate and regular rhythm.                 Pulmonary/Chest: Effort normal and breath sounds without rales or wheezing.                Abd:  Soft, NT, ND, + BS, no organomegaly               Neurological: Pt is alert. At baseline orientation, motor grossly intact               Skin: Skin is warm. No rashes, no other new lesions, LE edema - none               Psychiatric: Pt behavior is normal without agitation   Micro: none  Cardiac tracings I have personally interpreted today:  none  Pertinent Radiological findings (summarize): none   Lab Results  Component Value Date   WBC 10.9 (H) 05/01/2022   HGB 14.2 05/01/2022   HCT 42.3 05/01/2022   PLT 285.0 05/01/2022   GLUCOSE 91 05/01/2022   CHOL 171 05/01/2022   TRIG 133.0 05/01/2022   HDL 65.10 05/01/2022   LDLDIRECT 106.0 06/06/2021   LDLCALC 79 05/01/2022   ALT 11 05/01/2022   AST 16 05/01/2022   NA 137 05/01/2022   K 4.3 05/01/2022   CL 101 05/01/2022   CREATININE 1.19 05/01/2022   BUN 13 05/01/2022   CO2 29 05/01/2022   TSH 2.50  05/01/2022   HGBA1C 5.4 05/01/2022   Assessment/Plan:  [name] is a 72 y.o. White or Caucasian [1] female with  has a past medical history of ANXIETY (12/01/2006), Arthritis, Blepharospasm (12/01/2006), BONE SPUR (12/01/2006), Cellulitis and abscess of oral soft tissues (05/01/2008), Cervicalgia (08/24/2008), COLD SORE (05/30/2008), DEPRESSION (12/01/2006), EXCISION  OF GANGLION CYST, WRIST, HX OF (12/01/2006), GERD (12/01/2006), HYPERLIPIDEMIA (12/01/2006), HYPERTENSION, BORDERLINE (12/01/2006), LOW BACK PAIN (12/01/2006), MACULAR DEGENERATION (12/01/2006), OTITIS MEDIA, ACUTE, LEFT (05/30/2008), PARESTHESIA (08/24/2008), and SINUSITIS- ACUTE-NOS (01/07/2010).  Encounter for well adult exam with abnormal findings Age and sex appropriate education and counseling updated with regular exercise and diet Referrals for preventative services - declines colonoscopy, for cologuard, also has mammogram and dxa sched soon Immunizations addressed - none needed Smoking counseling  - none needed Evidence for depression or other mood disorder - anxiety imporoved, ok to d/c prozac and wellbutrin, cont klonopin bid prn Most recent labs reviewed. I have personally reviewed and have noted: 1) the patient's medical and social history 2) The patient's current medications and supplements 3) The patient's height, weight, and BMI have been recorded in the chart   B12 deficiency Lab Results  Component Value Date   VITAMINB12 418 05/01/2022   Stable, cont oral replacement - b12 1000 mcg qd   CKD (chronic kidney disease) stage 3, GFR 30-59 ml/min (HCC) Lab Results  Component Value Date   CREATININE 1.19 05/01/2022   Stable overall, cont to avoid nephrotoxins   Essential hypertension BP Readings from Last 3 Encounters:  05/01/22 (!) 158/80  04/10/22 (!) 142/88  03/05/22 (!) 148/80   Uncontrolled,, pt to continue medical treatment losartan 100 mg qd, and add amlodipine 10 mg qd   Hyperlipidemia Lab Results  Component Value Date   LDLCALC 79 05/01/2022   Stable, pt to continue current statin stable, cont repatha   Vitamin D deficiency Last vitamin D Lab Results  Component Value Date   VD25OH 36.65 05/01/2022   Low, to start oral replacement  Followup: Return in about 6 months (around 10/30/2022).  Cathlean Cower, MD 05/01/2022 7:58 PM [facility]

## 2022-05-01 NOTE — Assessment & Plan Note (Signed)
Lab Results  Component Value Date   LDLCALC 79 05/01/2022   Stable, pt to continue current statin stable, cont repatha

## 2022-05-01 NOTE — Assessment & Plan Note (Addendum)
Age and sex appropriate education and counseling updated with regular exercise and diet Referrals for preventative services - declines colonoscopy, for cologuard, also has mammogram and dxa sched soon Immunizations addressed - none needed Smoking counseling  - counsled to quit, pt not ready Evidence for depression or other mood disorder - anxiety imporoved, ok to d/c prozac and wellbutrin, cont klonopin bid prn Most recent labs reviewed. I have personally reviewed and have noted: 1) the patient's medical and social history 2) The patient's current medications and supplements 3) The patient's height, weight, and BMI have been recorded in the chart

## 2022-05-01 NOTE — Patient Instructions (Addendum)
Please take all new medication as prescribed  - the amlodipine 10 mg qd  Please continue all other medications as before, and refills have been done if requested.  Please have the pharmacy call with any other refills you may need.  Please continue your efforts at being more active, low cholesterol diet, and weight control.  You are otherwise up to date with prevention measures today.  Please keep your appointments with your specialists as you may have planned  Please go to the LAB at the blood drawing area for the tests to be done  You will be contacted by phone if any changes need to be made immediately.  Otherwise, you will receive a letter about your results with an explanation, but please check with MyChart first.  Please remember to sign up for MyChart if you have not done so, as this will be important to you in the future with finding out test results, communicating by private email, and scheduling acute appointments online when needed.  Please make an Appointment to return in 6 months, or sooner if needed

## 2022-05-01 NOTE — Assessment & Plan Note (Signed)
Lab Results  Component Value Date   CREATININE 1.19 05/01/2022   Stable overall, cont to avoid nephrotoxins

## 2022-05-03 IMAGING — DX DG THORACIC SPINE 2V
2 series · 2 of 2 positions shown · non-contrast
Comparison: None.

CLINICAL DATA: Chronic upper back pain without known injury.

EXAM:
THORACIC SPINE 2 VIEWS

[t-spine ap]
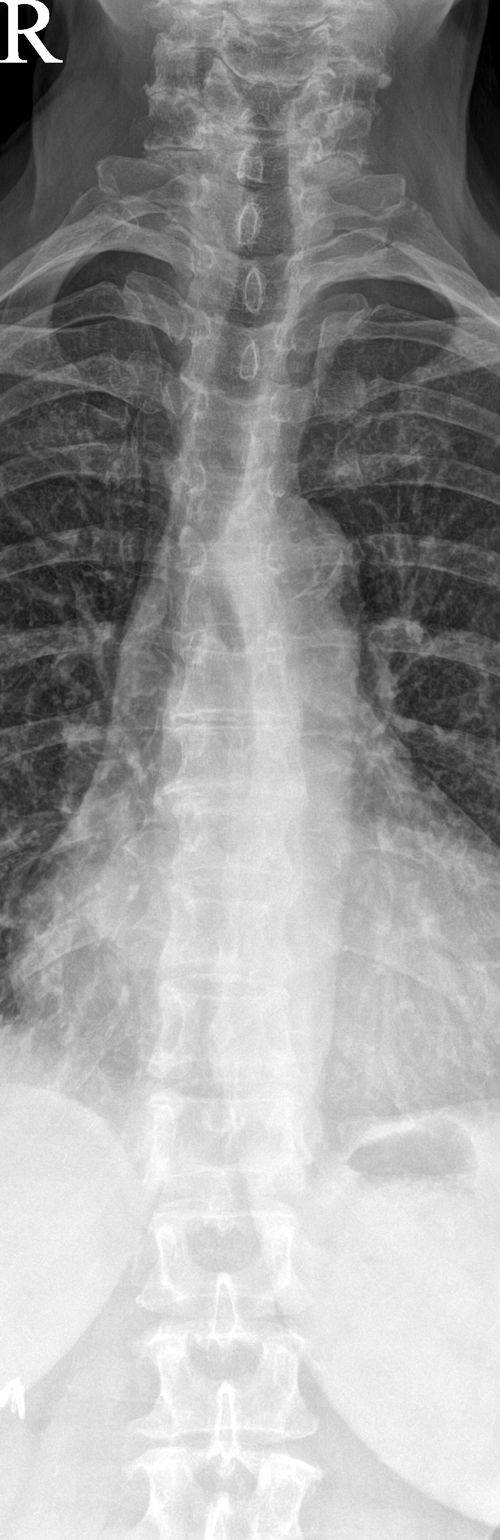

[t-spine lat]
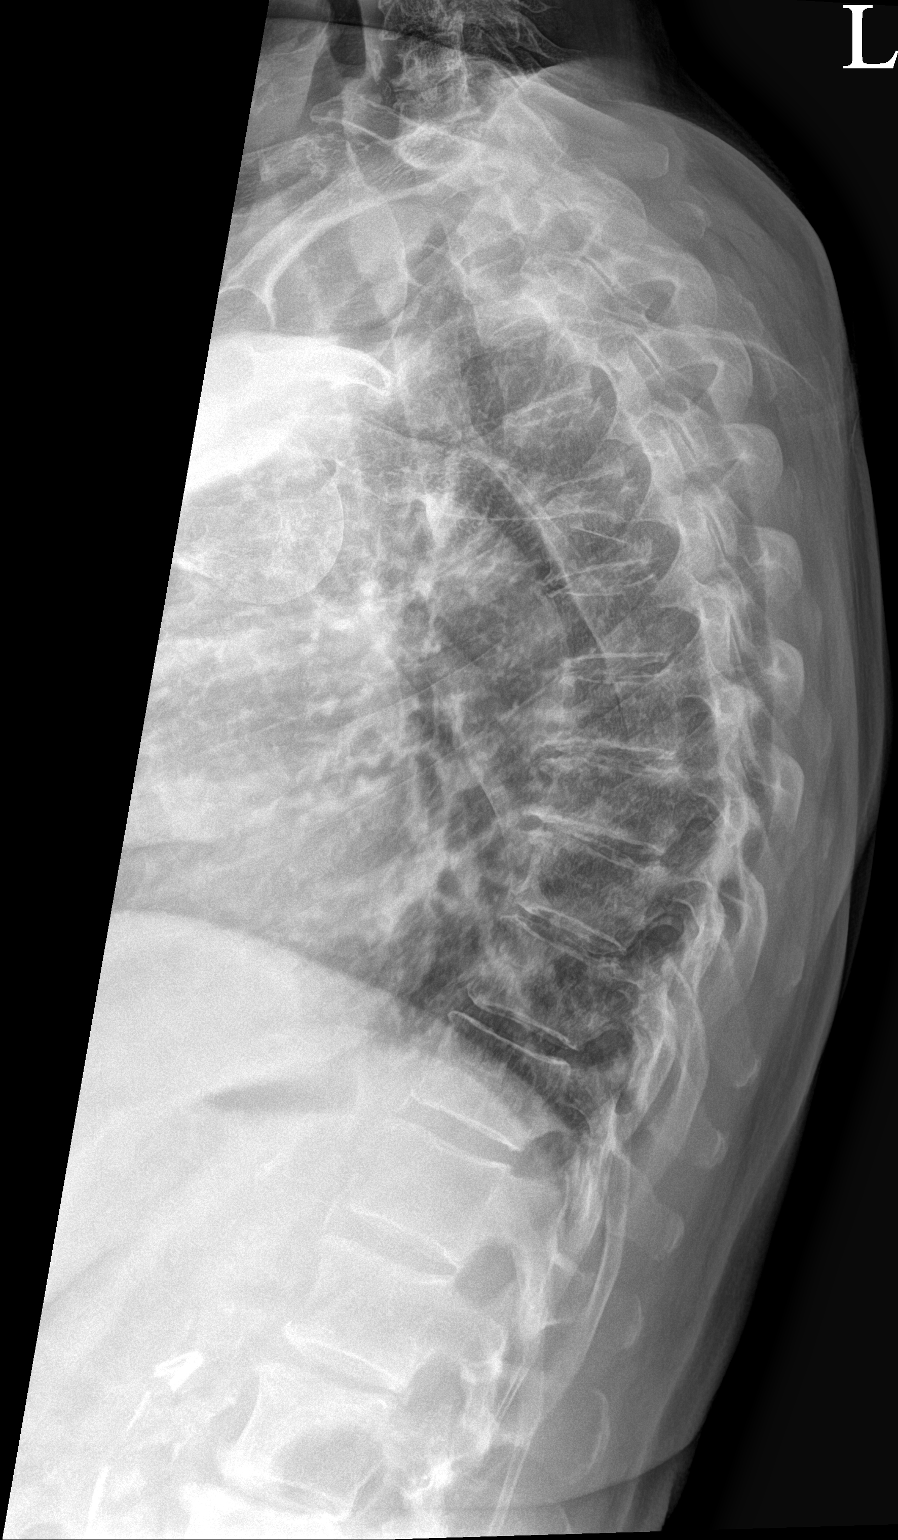

[2 of 2 positions shown; findings below may reference images not displayed]

FINDINGS: There is no evidence of thoracic spine fracture. Alignment is
normal. Mild degenerative changes are noted at multiple levels in
the midthoracic spine.
IMPRESSION: Mild multilevel degenerative changes are noted. No acute abnormality
seen.

## 2022-05-03 IMAGING — DX DG LUMBAR SPINE 2-3V
3 series · 3 of 3 positions shown · non-contrast
Comparison: None.

CLINICAL DATA: Chronic low back pain without known injury.

EXAM:
LUMBAR SPINE - 2-3 VIEW

[l-spine ap]
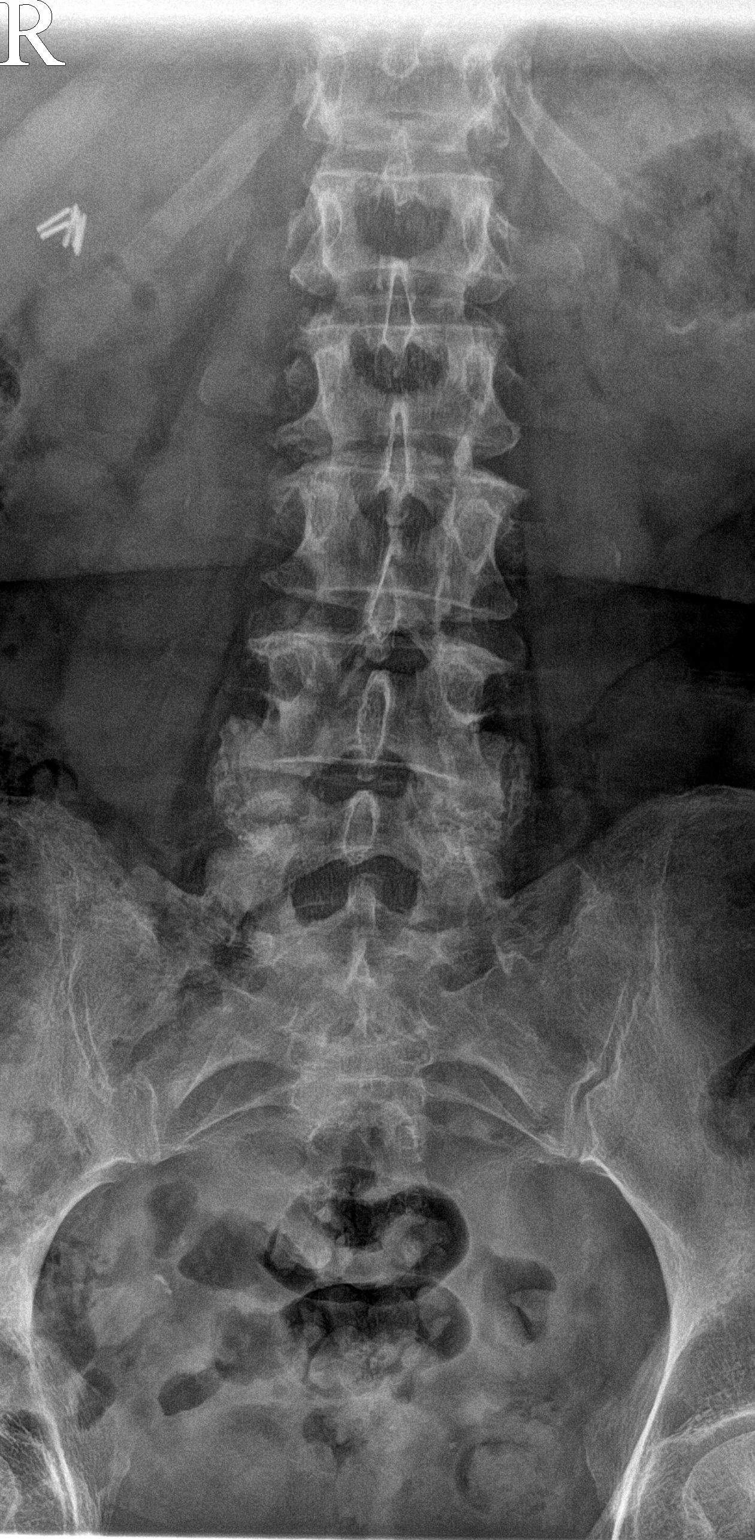

[l-spine lateral]
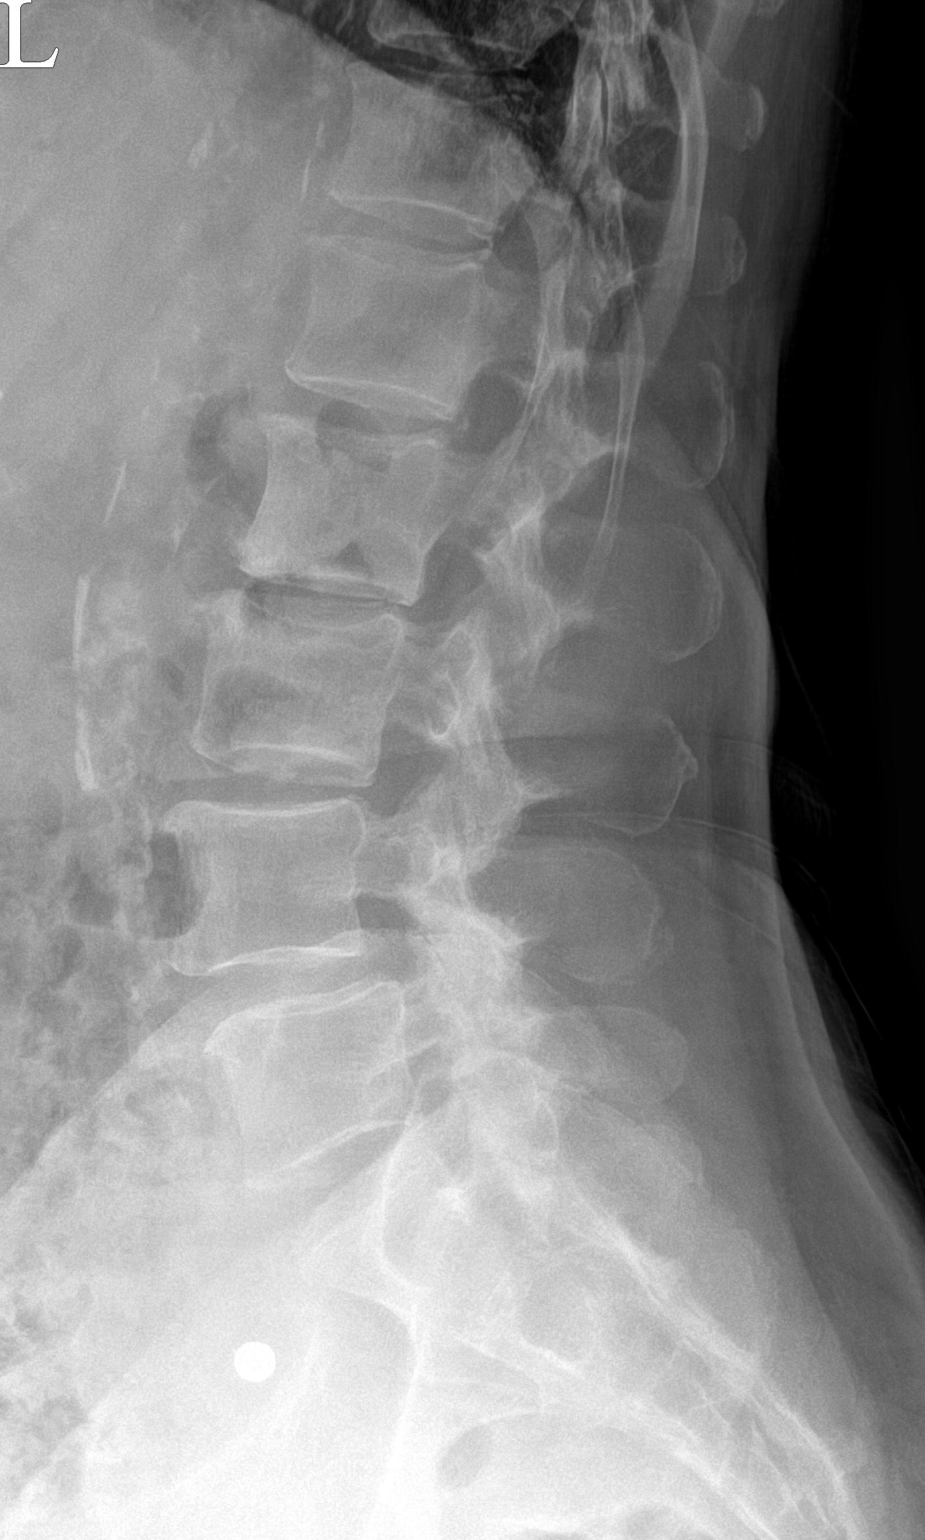

[l-spine spot]
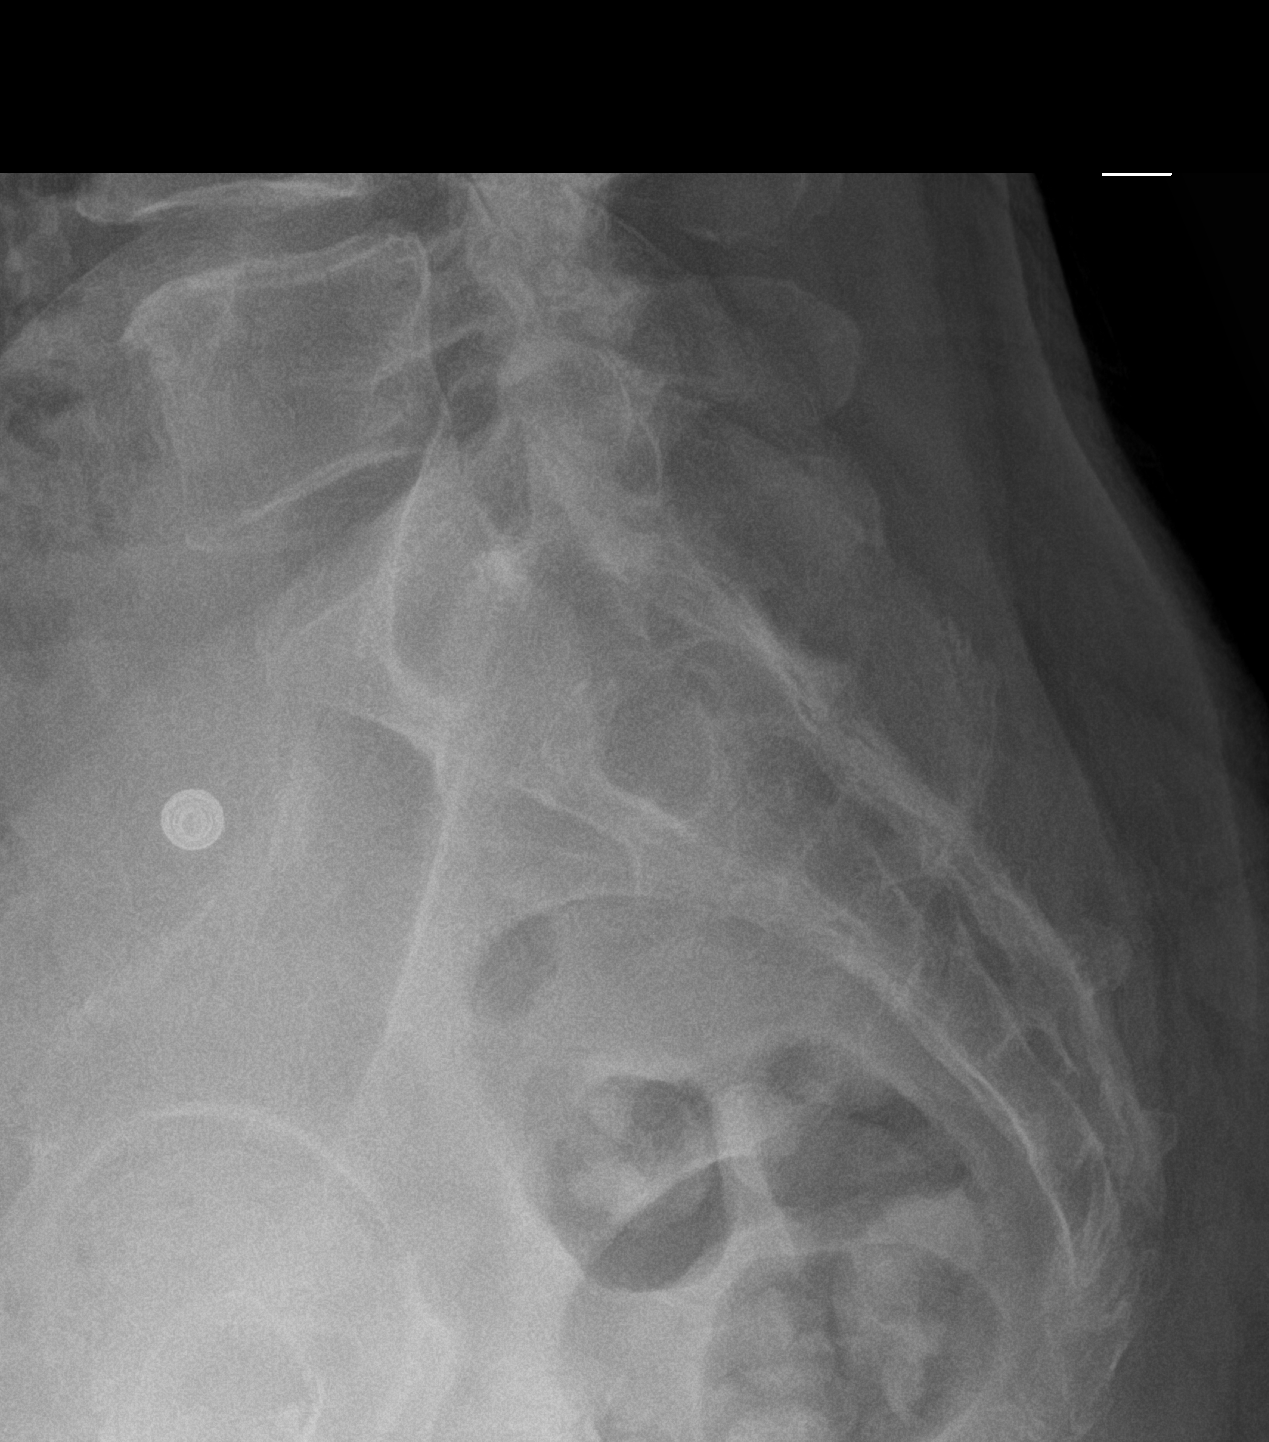

[3 of 3 positions shown; findings below may reference images not displayed]

FINDINGS: Mild grade 1 anterolisthesis of L4-5 is noted secondary to posterior
facet joint hypertrophy. Moderate degenerative disc disease is noted
at L2-3. No fracture is noted.
IMPRESSION: Moderate degenerative disc disease is noted at L2-3. No acute
abnormality is noted.

Aortic Atherosclerosis ([id number]).

## 2022-05-05 ENCOUNTER — Encounter: Payer: Self-pay | Admitting: Internal Medicine

## 2022-05-30 ENCOUNTER — Other Ambulatory Visit: Payer: Self-pay | Admitting: Internal Medicine

## 2022-05-30 ENCOUNTER — Ambulatory Visit (INDEPENDENT_AMBULATORY_CARE_PROVIDER_SITE_OTHER): Payer: Medicare Other | Admitting: Internal Medicine

## 2022-05-30 DIAGNOSIS — E559 Vitamin D deficiency, unspecified: Secondary | ICD-10-CM

## 2022-05-30 DIAGNOSIS — H6122 Impacted cerumen, left ear: Secondary | ICD-10-CM | POA: Diagnosis not present

## 2022-05-30 DIAGNOSIS — I10 Essential (primary) hypertension: Secondary | ICD-10-CM | POA: Diagnosis not present

## 2022-05-30 DIAGNOSIS — Z1211 Encounter for screening for malignant neoplasm of colon: Secondary | ICD-10-CM

## 2022-05-30 DIAGNOSIS — F17200 Nicotine dependence, unspecified, uncomplicated: Secondary | ICD-10-CM | POA: Diagnosis not present

## 2022-05-30 MED ORDER — HYDROCHLOROTHIAZIDE 12.5 MG PO CAPS: 12.5000 mg | ORAL_CAPSULE | Freq: Every day | ORAL | 3 refills | Status: DC

## 2022-05-30 MED ORDER — AMLODIPINE BESYLATE 5 MG PO TABS: 5.0000 mg | ORAL_TABLET | Freq: Every day | ORAL | 3 refills | Status: DC

## 2022-05-31 ENCOUNTER — Encounter: Payer: Self-pay | Admitting: Internal Medicine

## 2022-05-31 NOTE — Assessment & Plan Note (Signed)
Last vitamin D Lab Results  Component Value Date   VD25OH 36.65 05/01/2022   Low, to start oral replacement

## 2022-05-31 NOTE — Patient Instructions (Signed)
Ok to decrease the amlodipine to 5 mg per day  Please take all new medication as prescribed - the hct 12.5 qd  Your left ear was irrigated of wax today

## 2022-05-31 NOTE — Assessment & Plan Note (Signed)
Pt counsled to quit, pt not ready °

## 2022-05-31 NOTE — Assessment & Plan Note (Signed)
Ceruminosis is noted.  Wax is removed by syringing and manual debridement. Instructions for home care to prevent wax buildup are given.  

## 2022-05-31 NOTE — Assessment & Plan Note (Signed)
Unfortunately with increased pedal edema since increased amlodipine to 10 mg  BP Readings from Last 3 Encounters:  05/01/22 (!) 158/80  04/10/22 (!) 142/88  03/05/22 (!) 148/80   Pt to continue losartan 100 qd, decrease amlodipine to 5 mg qd, and add hct 12.5 qd

## 2022-05-31 NOTE — Progress Notes (Signed)
Patient ID: [name], female   DOB: 06-Sep-1950, 72 y.o.   MRN: [id number]        Chief Complaint: follow up htn, pedal edema, left ear wax impaction       HPI:  [name] is a 72 y.o. female here overall doing ok, but since amlodipine increased to 10 mg, has had worsening pedal edema during the day, better at night.  Pt denies chest pain, increased sob or doe, wheezing, orthopnea, PND, palpitations, dizziness or syncope.   Pt denies polydipsia, polyuria, or new focal neuro s/s.   Pt denies fever, wt loss, night sweats, loss of appetite, or other constitutional symptoms  Also with left ear hearing decreased with wax impaction over the past wk.         Wt Readings from Last 3 Encounters:  05/01/22 122 lb 9.6 oz (55.6 kg)  03/05/22 120 lb (54.4 kg)  01/31/22 119 lb (54 kg)   BP Readings from Last 3 Encounters:  05/01/22 (!) 158/80  04/10/22 (!) 142/88  03/05/22 (!) 148/80         Past Medical History:  Diagnosis Date   ANXIETY 12/01/2006   Arthritis    Blepharospasm 12/01/2006   BONE SPUR 12/01/2006   Cellulitis and abscess of oral soft tissues 05/01/2008   Cervicalgia 08/24/2008   COLD SORE 05/30/2008   DEPRESSION 12/01/2006   EXCISION OF GANGLION CYST, WRIST, HX OF 12/01/2006   GERD 12/01/2006   HYPERLIPIDEMIA 12/01/2006   HYPERTENSION, BORDERLINE 12/01/2006   LOW BACK PAIN 12/01/2006   MACULAR DEGENERATION 12/01/2006   OTITIS MEDIA, ACUTE, LEFT 05/30/2008   PARESTHESIA 08/24/2008   SINUSITIS- ACUTE-NOS 01/07/2010   Past Surgical History:  Procedure Laterality Date   ABDOMINAL HYSTERECTOMY     blepharospasm     bone spur     removal right foot   BREAST LUMPECTOMY     right   GANGLION CYST EXCISION     H/O Macular degeneration      reports that she has been smoking. She has never used smokeless tobacco. She reports that she does not drink alcohol and does not use drugs. family history includes Diabetes in her daughter; Heart attack in her father; Heart disease in her father; Hypertension in  her mother; Pulmonary fibrosis in her mother. Allergies  Allergen Reactions   Codeine    Hydrocodone-Acetaminophen    Loratadine-Pseudoephedrine Er    Statins Other (See Comments)    Joint and muscle cramp   Augmentin [Amoxicillin-Pot Clavulanate] Diarrhea and Nausea And Vomiting   Current Outpatient Medications on File Prior to Visit  Medication Sig Dispense Refill   amLODipine (NORVASC) 5 MG tablet Take 1 tablet (5 mg total) by mouth daily. 90 tablet 3   hydrochlorothiazide (MICROZIDE) 12.5 MG capsule Take 1 capsule (12.5 mg total) by mouth daily. 90 capsule 3   aspirin 81 MG tablet Take 81 mg by mouth daily.     clonazePAM (KLONOPIN) 1 MG tablet TAKE 1/2 TO 1 TABLET BY MOUTH TWICE DAILY AS NEEDED 60 tablet 5   Evolocumab (REPATHA SURECLICK) 956 MG/ML SOAJ Take 1 injection every 2 weeks 2 mL 11   losartan (COZAAR) 100 MG tablet Take 1 tablet (100 mg total) by mouth daily. 90 tablet 3   meloxicam (MOBIC) 7.5 MG tablet TAKE 1 TABLET(7.5 MG) BY MOUTH DAILY 30 tablet 0   No current facility-administered medications on file prior to visit.        ROS:  All others reviewed  and negative.  Objective        PE:  There were no vitals taken for this visit.                Constitutional: Pt appears in NAD               HENT: Head: NCAT.                Right Ear: External ear normal.                 Left Ear: External ear normal.  Left eear canal wax impaction resolved with irrigation               Eyes: . Pupils are equal, round, and reactive to light. Conjunctivae and EOM are normal               Nose: without d/c or deformity               Neck: Neck supple. Gross normal ROM               Cardiovascular: Normal rate and regular rhythm.                 Pulmonary/Chest: Effort normal and breath sounds without rales or wheezing.                Abd:  Soft, NT, ND, + BS, no organomegaly               Neurological: Pt is alert. At baseline orientation, motor grossly intact                Skin: Skin is warm. No rashes, no other new lesions, LE edema - trace pedal edema bilateral               Psychiatric: Pt behavior is normal without agitation   Micro: none  Cardiac tracings I have personally interpreted today:  none  Pertinent Radiological findings (summarize): none   Lab Results  Component Value Date   WBC 10.9 (H) 05/01/2022   HGB 14.2 05/01/2022   HCT 42.3 05/01/2022   PLT 285.0 05/01/2022   GLUCOSE 91 05/01/2022   CHOL 171 05/01/2022   TRIG 133.0 05/01/2022   HDL 65.10 05/01/2022   LDLDIRECT 106.0 06/06/2021   LDLCALC 79 05/01/2022   ALT 11 05/01/2022   AST 16 05/01/2022   NA 137 05/01/2022   K 4.3 05/01/2022   CL 101 05/01/2022   CREATININE 1.19 05/01/2022   BUN 13 05/01/2022   CO2 29 05/01/2022   TSH 2.50 05/01/2022   HGBA1C 5.4 05/01/2022   Assessment/Plan:  [name] is a 72 y.o. White or Caucasian [1] female with  has a past medical history of ANXIETY (12/01/2006), Arthritis, Blepharospasm (12/01/2006), BONE SPUR (12/01/2006), Cellulitis and abscess of oral soft tissues (05/01/2008), Cervicalgia (08/24/2008), COLD SORE (05/30/2008), DEPRESSION (12/01/2006), EXCISION OF GANGLION CYST, WRIST, HX OF (12/01/2006), GERD (12/01/2006), HYPERLIPIDEMIA (12/01/2006), HYPERTENSION, BORDERLINE (12/01/2006), LOW BACK PAIN (12/01/2006), MACULAR DEGENERATION (12/01/2006), OTITIS MEDIA, ACUTE, LEFT (05/30/2008), PARESTHESIA (08/24/2008), and SINUSITIS- ACUTE-NOS (01/07/2010).  Essential hypertension Unfortunately with increased pedal edema since increased amlodipine to 10 mg  BP Readings from Last 3 Encounters:  05/01/22 (!) 158/80  04/10/22 (!) 142/88  03/05/22 (!) 148/80   Pt to continue losartan 100 qd, decrease amlodipine to 5 mg qd, and add hct 12.5 qd   Vitamin D deficiency Last vitamin D Lab Results  Component Value Date  VD25OH 36.65 05/01/2022   Low, to start oral replacement   Smoker Pt counsled to quit, pt not ready  Impacted cerumen, left ear Ceruminosis is  noted.  Wax is removed by syringing and manual debridement. Instructions for home care to prevent wax buildup are given.  Followup: Return if symptoms worsen or fail to improve.  Cathlean Cower, MD 05/31/2022 8:23 PM [facility]

## 2022-06-01 ENCOUNTER — Other Ambulatory Visit: Payer: Self-pay | Admitting: Family Medicine

## 2022-06-05 ENCOUNTER — Ambulatory Visit: Payer: Medicare Other | Admitting: Internal Medicine

## 2022-06-08 DIAGNOSIS — Z1211 Encounter for screening for malignant neoplasm of colon: Secondary | ICD-10-CM | POA: Diagnosis not present

## 2022-06-17 ENCOUNTER — Other Ambulatory Visit: Payer: Self-pay | Admitting: Family Medicine

## 2022-06-19 LAB — COLOGUARD: COLOGUARD: NEGATIVE

## 2022-07-09 NOTE — Progress Notes (Unsigned)
[facility] [place] [address] [place] Phone: [phone] Subjective:   [name], am serving as a scribe for Dr. Hulan Saas.  I'm seeing this patient by the request  of:  Biagio Borg, MD  CC: Back and neck pain  [id number]  [name] is a 72 y.o. female coming in with complaint of back and neck pain. OMT 04/10/2022. Patient states doing well. Same per usual. Wants to know if there is a muscle relaxer she can take instead of meloxicam. No other concerns.  Medications patient has been prescribed: Meloxicam  Taking:         Reviewed prior external information including notes and imaging from previsou exam, outside providers and external EMR if available.   As well as notes that were available from care everywhere and other healthcare systems.  Past medical history, social, surgical and family history all reviewed in electronic medical record.  No pertanent information unless stated regarding to the chief complaint.   Past Medical History:  Diagnosis Date   ANXIETY 12/01/2006   Arthritis    Blepharospasm 12/01/2006   BONE SPUR 12/01/2006   Cellulitis and abscess of oral soft tissues 05/01/2008   Cervicalgia 08/24/2008   COLD SORE 05/30/2008   DEPRESSION 12/01/2006   EXCISION OF GANGLION CYST, WRIST, HX OF 12/01/2006   GERD 12/01/2006   HYPERLIPIDEMIA 12/01/2006   HYPERTENSION, BORDERLINE 12/01/2006   LOW BACK PAIN 12/01/2006   MACULAR DEGENERATION 12/01/2006   OTITIS MEDIA, ACUTE, LEFT 05/30/2008   PARESTHESIA 08/24/2008   SINUSITIS- ACUTE-NOS 01/07/2010    Allergies  Allergen Reactions   Codeine    Hydrocodone-Acetaminophen    Loratadine-Pseudoephedrine Er    Statins Other (See Comments)    Joint and muscle cramp   Augmentin [Amoxicillin-Pot Clavulanate] Diarrhea and Nausea And Vomiting     Review of Systems:  No headache, visual changes, nausea, vomiting, diarrhea, constipation, dizziness, abdominal pain, skin rash, fevers,  chills, night sweats, weight loss, swollen lymph nodes,  joint swelling, chest pain, shortness of breath, mood changes. POSITIVE muscle aches, body aches  Objective  Blood pressure 124/68, pulse 82, height '5\' 3"'$  (1.6 m), weight 125 lb (56.7 kg), SpO2 96 %.   General: No apparent distress alert and oriented x3 mood and affect normal, dressed appropriately.  HEENT: Pupils equal, extraocular movements intact  Respiratory: Patient's speak in full sentences and does not appear short of breath  Cardiovascular: No lower extremity edema, non tender, no erythema  Low back and significant tightness noted of the paraspinal musculature with positive FABER test bilaterally.  Negative straight leg test noted as well.  Osteopathic findings  C3 flexed rotated and side bent right C6 flexed rotated and side bent left T3 extended rotated and side bent right inhaled rib T9 extended rotated and side bent left L1 flexed rotated and side bent right L5 flexed rotated and side bent left Sacrum right on right       Assessment and Plan:  SI (sacroiliac) joint dysfunction Continues to have chronic pain overall.  Feels like she has had an exacerbation.  Discussed different treatment options and patient elected to try the osteopathic manipulation again.  We discussed potentially repeating injections if this does not seem to make a significant improvement.  Discussed which activities will be wonderful and which ones could be more inclined to help.  Patient wants to not change any other significant medications at this time.  Can continue to take the meloxicam  as needed.    Nonallopathic problems  Decision today to treat with OMT was based on Physical Exam  After verbal consent patient was treated with HVLA, ME, FPR techniques in cervical, rib, thoracic, lumbar, and sacral  areas  Patient tolerated the procedure well with improvement in symptoms  Patient given exercises, stretches and lifestyle  modifications  See medications in patient instructions if given  Patient will follow up in 4-8 weeks    The above documentation has been reviewed and is accurate and complete Lyndal Pulley, DO          Note: This dictation was prepared with Dragon dictation along with smaller phrase technology. Any transcriptional errors that result from this process are unintentional.

## 2022-07-10 ENCOUNTER — Encounter: Payer: Self-pay | Admitting: Family Medicine

## 2022-07-10 ENCOUNTER — Ambulatory Visit: Payer: Medicare Other | Admitting: Family Medicine

## 2022-07-10 VITALS — BP 124/68 | HR 82 | Ht 63.0 in | Wt 125.0 lb

## 2022-07-10 DIAGNOSIS — M9903 Segmental and somatic dysfunction of lumbar region: Secondary | ICD-10-CM | POA: Diagnosis not present

## 2022-07-10 DIAGNOSIS — M9901 Segmental and somatic dysfunction of cervical region: Secondary | ICD-10-CM | POA: Diagnosis not present

## 2022-07-10 DIAGNOSIS — M533 Sacrococcygeal disorders, not elsewhere classified: Secondary | ICD-10-CM | POA: Diagnosis not present

## 2022-07-10 DIAGNOSIS — M9904 Segmental and somatic dysfunction of sacral region: Secondary | ICD-10-CM

## 2022-07-10 DIAGNOSIS — M9902 Segmental and somatic dysfunction of thoracic region: Secondary | ICD-10-CM

## 2022-07-10 DIAGNOSIS — M9908 Segmental and somatic dysfunction of rib cage: Secondary | ICD-10-CM

## 2022-07-10 NOTE — Assessment & Plan Note (Signed)
Continues to have chronic pain overall.  Feels like she has had an exacerbation.  Discussed different treatment options and patient elected to try the osteopathic manipulation again.  We discussed potentially repeating injections if this does not seem to make a significant improvement.  Discussed which activities will be wonderful and which ones could be more inclined to help.  Patient wants to not change any other significant medications at this time.  Can continue to take the meloxicam as needed.

## 2022-07-10 NOTE — Patient Instructions (Signed)
A sound side beach is all you need Manipulations still helps See you again in 2-3 months

## 2022-07-14 ENCOUNTER — Encounter: Payer: Self-pay | Admitting: Family Medicine

## 2022-07-14 MED ORDER — PREDNISONE 20 MG PO TABS: 40.0000 mg | ORAL_TABLET | Freq: Every day | ORAL | 1 refills | Status: DC

## 2022-07-15 ENCOUNTER — Other Ambulatory Visit: Payer: Self-pay | Admitting: Family Medicine

## 2022-07-21 ENCOUNTER — Encounter: Payer: Self-pay | Admitting: Internal Medicine

## 2022-07-21 NOTE — Telephone Encounter (Signed)
Noted../lmb 

## 2022-07-22 ENCOUNTER — Ambulatory Visit: Payer: Medicare Other

## 2022-07-22 ENCOUNTER — Inpatient Hospital Stay: Admission: RE | Admit: 2022-07-22 | Payer: Medicare Other | Source: Ambulatory Visit

## 2022-08-01 ENCOUNTER — Ambulatory Visit (INDEPENDENT_AMBULATORY_CARE_PROVIDER_SITE_OTHER): Payer: Medicare Other | Admitting: Internal Medicine

## 2022-08-01 VITALS — BP 132/80 | HR 60 | Temp 98.1°F | Ht 63.0 in | Wt 127.0 lb

## 2022-08-01 DIAGNOSIS — F17200 Nicotine dependence, unspecified, uncomplicated: Secondary | ICD-10-CM

## 2022-08-01 DIAGNOSIS — E782 Mixed hyperlipidemia: Secondary | ICD-10-CM

## 2022-08-01 DIAGNOSIS — N1831 Chronic kidney disease, stage 3a: Secondary | ICD-10-CM | POA: Diagnosis not present

## 2022-08-01 DIAGNOSIS — E538 Deficiency of other specified B group vitamins: Secondary | ICD-10-CM | POA: Diagnosis not present

## 2022-08-01 DIAGNOSIS — I10 Essential (primary) hypertension: Secondary | ICD-10-CM

## 2022-08-01 DIAGNOSIS — R739 Hyperglycemia, unspecified: Secondary | ICD-10-CM | POA: Diagnosis not present

## 2022-08-01 DIAGNOSIS — E559 Vitamin D deficiency, unspecified: Secondary | ICD-10-CM

## 2022-08-01 LAB — LIPID PANEL
Cholesterol: 169 mg/dL (ref 0–200)
HDL: 56.4 mg/dL (ref >39.00)
LDL Cholesterol: 88 mg/dL (ref 0–99)
NonHDL: 112.93
Total CHOL/HDL Ratio: 3
Triglycerides: 127 mg/dL (ref 0.0–149.0)
VLDL: 25.4 mg/dL (ref 0.0–40.0)

## 2022-08-01 LAB — HEPATIC FUNCTION PANEL
ALT: 9 U/L (ref 0–35)
AST: 14 U/L (ref 0–37)
Albumin: 3.9 g/dL (ref 3.5–5.2)
Alkaline Phosphatase: 62 U/L (ref 39–117)
Bilirubin, Direct: 0.1 mg/dL (ref 0.0–0.3)
Total Bilirubin: 0.3 mg/dL (ref 0.2–1.2)
Total Protein: 6.8 g/dL (ref 6.0–8.3)

## 2022-08-01 LAB — CBC WITH DIFFERENTIAL/PLATELET
Basophils Absolute: 0.1 10*3/uL (ref 0.0–0.1)
Basophils Relative: 0.9 % (ref 0.0–3.0)
Eosinophils Absolute: 0.2 10*3/uL (ref 0.0–0.7)
Eosinophils Relative: 1.9 % (ref 0.0–5.0)
HCT: 37.6 % (ref 36.0–46.0)
Hemoglobin: 12.5 g/dL (ref 12.0–15.0)
Lymphocytes Relative: 25.2 % (ref 12.0–46.0)
Lymphs Abs: 3.1 10*3/uL (ref 0.7–4.0)
MCHC: 33.3 g/dL (ref 30.0–36.0)
MCV: 88.8 fl (ref 78.0–100.0)
Monocytes Absolute: 0.9 10*3/uL (ref 0.1–1.0)
Monocytes Relative: 7.5 % (ref 3.0–12.0)
Neutro Abs: 7.8 10*3/uL — ABNORMAL HIGH (ref 1.4–7.7)
Neutrophils Relative %: 64.5 % (ref 43.0–77.0)
Platelets: 265 10*3/uL (ref 150.0–400.0)
RBC: 4.23 Mil/uL (ref 3.87–5.11)
RDW: 13.7 % (ref 11.5–15.5)
WBC: 12.1 10*3/uL — ABNORMAL HIGH (ref 4.0–10.5)

## 2022-08-01 LAB — URINALYSIS, ROUTINE W REFLEX MICROSCOPIC
Bilirubin Urine: NEGATIVE
Hgb urine dipstick: NEGATIVE
Ketones, ur: NEGATIVE
Leukocytes,Ua: NEGATIVE
Nitrite: NEGATIVE
Specific Gravity, Urine: 1.02 (ref 1.000–1.030)
Total Protein, Urine: NEGATIVE
Urine Glucose: NEGATIVE
Urobilinogen, UA: 0.2 (ref 0.0–1.0)
pH: 5.5 (ref 5.0–8.0)

## 2022-08-01 LAB — BASIC METABOLIC PANEL
BUN: 19 mg/dL (ref 6–23)
CO2: 28 mEq/L (ref 19–32)
Calcium: 9.5 mg/dL (ref 8.4–10.5)
Chloride: 104 mEq/L (ref 96–112)
Creatinine, Ser: 1.2 mg/dL (ref 0.40–1.20)
GFR: 46.06 mL/min — ABNORMAL LOW (ref >60.00)
Glucose, Bld: 79 mg/dL (ref 70–99)
Potassium: 4.1 mEq/L (ref 3.5–5.1)
Sodium: 137 mEq/L (ref 135–145)

## 2022-08-01 LAB — TSH: TSH: 2.55 u[IU]/mL (ref 0.35–5.50)

## 2022-08-01 LAB — VITAMIN B12: Vitamin B-12: 420 pg/mL (ref 211–911)

## 2022-08-01 LAB — HEMOGLOBIN A1C: Hgb A1c MFr Bld: 6 % (ref 4.6–6.5)

## 2022-08-01 LAB — VITAMIN D 25 HYDROXY (VIT D DEFICIENCY, FRACTURES): VITD: 34.74 ng/mL (ref 30.00–100.00)

## 2022-08-01 NOTE — Progress Notes (Unsigned)
Patient ID: [name], female   DOB: 04/05/1951, 72 y.o.   MRN: [id number]        Chief Complaint: follow up low b12, ckd, htn, hld, smoker, low vit d       HPI:  [name] is a 72 y.o. female here overall doing ok,  Pt denies chest pain, increased sob or doe, wheezing, orthopnea, PND, increased LE swelling, palpitations, dizziness or syncope.   Pt denies polydipsia, polyuria, or new focal neuro s/s.    Pt denies fever, wt loss, night sweats, loss of appetite, or other constitutional symptoms   Denies worsening depressive symptoms, suicidal ideation, or panic; has ongoing anxiety, Has mammogram and DXA for sept 2024.   Cut back on smoking now < 1/2 ppd.    Wt Readings from Last 3 Encounters:  08/01/22 127 lb (57.6 kg)  07/10/22 125 lb (56.7 kg)  05/01/22 122 lb 9.6 oz (55.6 kg)   BP Readings from Last 3 Encounters:  08/01/22 132/80  07/10/22 124/68  05/01/22 (!) 158/80         Past Medical History:  Diagnosis Date   ANXIETY 12/01/2006   Arthritis    Blepharospasm 12/01/2006   BONE SPUR 12/01/2006   Cellulitis and abscess of oral soft tissues 05/01/2008   Cervicalgia 08/24/2008   COLD SORE 05/30/2008   DEPRESSION 12/01/2006   EXCISION OF GANGLION CYST, WRIST, HX OF 12/01/2006   GERD 12/01/2006   HYPERLIPIDEMIA 12/01/2006   HYPERTENSION, BORDERLINE 12/01/2006   LOW BACK PAIN 12/01/2006   MACULAR DEGENERATION 12/01/2006   OTITIS MEDIA, ACUTE, LEFT 05/30/2008   PARESTHESIA 08/24/2008   SINUSITIS- ACUTE-NOS 01/07/2010   Past Surgical History:  Procedure Laterality Date   ABDOMINAL HYSTERECTOMY     blepharospasm     bone spur     removal right foot   BREAST LUMPECTOMY     right   GANGLION CYST EXCISION     H/O Macular degeneration      reports that she has been smoking. She has never used smokeless tobacco. She reports that she does not drink alcohol and does not use drugs. family history includes Diabetes in her daughter; Heart attack in her father; Heart disease in her father; Hypertension in  her mother; Pulmonary fibrosis in her mother. Allergies  Allergen Reactions   Codeine    Hydrocodone-Acetaminophen    Loratadine-Pseudoephedrine Er    Statins Other (See Comments)    Joint and muscle cramp   Augmentin [Amoxicillin-Pot Clavulanate] Diarrhea and Nausea And Vomiting   Current Outpatient Medications on File Prior to Visit  Medication Sig Dispense Refill   amLODipine (NORVASC) 5 MG tablet Take 1 tablet (5 mg total) by mouth daily. 90 tablet 3   aspirin 81 MG tablet Take 81 mg by mouth daily.     clonazePAM (KLONOPIN) 1 MG tablet TAKE 1/2 TO 1 TABLET BY MOUTH TWICE DAILY AS NEEDED 60 tablet 5   Evolocumab (REPATHA SURECLICK) 140 MG/ML SOAJ Take 1 injection every 2 weeks 2 mL 11   famotidine (PEPCID) 20 MG tablet Take 20 mg by mouth 2 (two) times daily.     FLUoxetine (PROZAC) 40 MG capsule Take 40 mg by mouth daily.     hydrochlorothiazide (MICROZIDE) 12.5 MG capsule Take 1 capsule (12.5 mg total) by mouth daily. 90 capsule 3   losartan (COZAAR) 100 MG tablet Take 1 tablet (100 mg total) by mouth daily. 90 tablet 3   meloxicam (MOBIC) 7.5 MG tablet TAKE 1 TABLET(7.5  MG) BY MOUTH DAILY 30 tablet 0   predniSONE (DELTASONE) 20 MG tablet Take 2 tablets (40 mg total) by mouth daily with breakfast. 10 tablet 1   tiZANidine (ZANAFLEX) 2 MG tablet Take 2 mg by mouth at bedtime.     No current facility-administered medications on file prior to visit.        ROS:  All others reviewed and negative.  Objective        PE:  BP 132/80   Pulse 60   Temp 98.1 F (36.7 C) (Oral)   Ht 5\' 3"  (1.6 m)   Wt 127 lb (57.6 kg)   SpO2 96%   BMI 22.50 kg/m                 Constitutional: Pt appears in NAD               HENT: Head: NCAT.                Right Ear: External ear normal.                 Left Ear: External ear normal.                Eyes: . Pupils are equal, round, and reactive to light. Conjunctivae and EOM are normal               Nose: without d/c or deformity                Neck: Neck supple. Gross normal ROM               Cardiovascular: Normal rate and regular rhythm.                 Pulmonary/Chest: Effort normal and breath sounds without rales or wheezing.                Abd:  Soft, NT, ND, + BS, no organomegaly               Neurological: Pt is alert. At baseline orientation, motor grossly intact               Skin: Skin is warm. No rashes, no other new lesions, LE edema - none               Psychiatric: Pt behavior is normal without agitation   Micro: none  Cardiac tracings I have personally interpreted today:  none  Pertinent Radiological findings (summarize): none   Lab Results  Component Value Date   WBC 12.1 (H) 08/01/2022   HGB 12.5 08/01/2022   HCT 37.6 08/01/2022   PLT 265.0 08/01/2022   GLUCOSE 79 08/01/2022   CHOL 169 08/01/2022   TRIG 127.0 08/01/2022   HDL 56.40 08/01/2022   LDLDIRECT 106.0 06/06/2021   LDLCALC 88 08/01/2022   ALT 9 08/01/2022   AST 14 08/01/2022   NA 137 08/01/2022   K 4.1 08/01/2022   CL 104 08/01/2022   CREATININE 1.20 08/01/2022   BUN 19 08/01/2022   CO2 28 08/01/2022   TSH 2.55 08/01/2022   HGBA1C 6.0 08/01/2022   Assessment/Plan:  [name] is a 72 y.o. White or Caucasian [1] female with  has a past medical history of ANXIETY (12/01/2006), Arthritis, Blepharospasm (12/01/2006), BONE SPUR (12/01/2006), Cellulitis and abscess of oral soft tissues (05/01/2008), Cervicalgia (08/24/2008), COLD SORE (05/30/2008), DEPRESSION (12/01/2006), EXCISION OF GANGLION CYST, WRIST, HX OF (12/01/2006), GERD (12/01/2006), HYPERLIPIDEMIA (12/01/2006), HYPERTENSION, BORDERLINE (12/01/2006),  LOW BACK PAIN (12/01/2006), MACULAR DEGENERATION (12/01/2006), OTITIS MEDIA, ACUTE, LEFT (05/30/2008), PARESTHESIA (08/24/2008), and SINUSITIS- ACUTE-NOS (01/07/2010).  B12 deficiency Lab Results  Component Value Date   VITAMINB12 420 08/01/2022   Stable, cont oral replacement - b12 1000 mcg qd   CKD (chronic kidney disease) stage 3, GFR 30-59 ml/min  (HCC) Lab Results  Component Value Date   CREATININE 1.20 08/01/2022   Stable overall, cont to avoid nephrotoxins   Essential hypertension BP Readings from Last 3 Encounters:  08/01/22 132/80  07/10/22 124/68  05/01/22 (!) 158/80   Stable, pt to continue medical treatment losartan 100 mg qd, hct 12.5 mg qd, norvasc 5 mg qd   Hyperlipidemia Lab Results  Component Value Date   LDLCALC 88 08/01/2022   Uncontrolled, goal ldl < 70,, pt to continue current repatha 140 mg q 2wks, low chol diet, declines other change today, declines card CT score for now   Smoker pt counsled to quit completely, the patient not ready  Vitamin D deficiency Last vitamin D Lab Results  Component Value Date   VD25OH 34.74 08/01/2022   Low, to start oral replacement  Followup: Return in about 6 months (around 01/31/2023).  Oliver Barre, MD 08/03/2022 8:12 PM [facility]

## 2022-08-01 NOTE — Patient Instructions (Signed)
Please call if you would want the Cardiac CT score testing ordered  Please continue all other medications as before, and refills have been done if requested.  Please have the pharmacy call with any other refills you may need.  Please continue your efforts at being more active, low cholesterol diet, and weight control.  You are otherwise up to date with prevention measures today.  Please keep your appointments with your specialists as you may have planned  Please go to the LAB at the blood drawing area for the tests to be done  You will be contacted by phone if any changes need to be made immediately.  Otherwise, you will receive a letter about your results with an explanation, but please check with MyChart first.  Please remember to sign up for MyChart if you have not done so, as this will be important to you in the future with finding out test results, communicating by private email, and scheduling acute appointments online when needed.  Please make an Appointment to return in 6 months, or sooner if needed

## 2022-08-03 ENCOUNTER — Encounter: Payer: Self-pay | Admitting: Internal Medicine

## 2022-08-03 NOTE — Assessment & Plan Note (Signed)
BP Readings from Last 3 Encounters:  08/01/22 132/80  07/10/22 124/68  05/01/22 (!) 158/80   Stable, pt to continue medical treatment losartan 100 mg qd, hct 12.5 mg qd, norvasc 5 mg qd

## 2022-08-03 NOTE — Assessment & Plan Note (Signed)
Lab Results  Component Value Date   VITAMINB12 420 08/01/2022   Stable, cont oral replacement - b12 1000 mcg qd

## 2022-08-03 NOTE — Assessment & Plan Note (Signed)
pt counsled to quit completely, the patient not ready

## 2022-08-03 NOTE — Assessment & Plan Note (Signed)
Last vitamin D Lab Results  Component Value Date   VD25OH 34.74 08/01/2022   Low, to start oral replacement

## 2022-08-03 NOTE — Assessment & Plan Note (Signed)
Lab Results  Component Value Date   CREATININE 1.20 08/01/2022   Stable overall, cont to avoid nephrotoxins

## 2022-08-03 NOTE — Assessment & Plan Note (Addendum)
Lab Results  Component Value Date   LDLCALC 88 08/01/2022   Uncontrolled, goal ldl < 70,, pt to continue current repatha 140 mg q 2wks, low chol diet, declines other change today, declines card CT score for now

## 2022-08-05 ENCOUNTER — Other Ambulatory Visit: Payer: Self-pay

## 2022-08-05 MED ORDER — FAMOTIDINE 20 MG PO TABS: 20.0000 mg | ORAL_TABLET | Freq: Two times a day (BID) | ORAL | 1 refills | Status: DC

## 2022-08-06 ENCOUNTER — Encounter: Payer: Self-pay | Admitting: Internal Medicine

## 2022-08-07 MED ORDER — VALACYCLOVIR HCL 1 G PO TABS: 1000.0000 mg | ORAL_TABLET | Freq: Two times a day (BID) | ORAL | 0 refills | Status: DC | PRN

## 2022-08-11 ENCOUNTER — Other Ambulatory Visit: Payer: Self-pay | Admitting: Family Medicine

## 2022-08-14 DIAGNOSIS — H353131 Nonexudative age-related macular degeneration, bilateral, early dry stage: Secondary | ICD-10-CM | POA: Diagnosis not present

## 2022-08-14 DIAGNOSIS — H25013 Cortical age-related cataract, bilateral: Secondary | ICD-10-CM | POA: Diagnosis not present

## 2022-08-14 DIAGNOSIS — H2513 Age-related nuclear cataract, bilateral: Secondary | ICD-10-CM | POA: Diagnosis not present

## 2022-08-25 ENCOUNTER — Encounter: Payer: Self-pay | Admitting: Family Medicine

## 2022-08-27 ENCOUNTER — Other Ambulatory Visit: Payer: Self-pay

## 2022-08-27 MED ORDER — LOSARTAN POTASSIUM 100 MG PO TABS: 100.0000 mg | ORAL_TABLET | Freq: Every day | ORAL | 3 refills | Status: DC

## 2022-09-04 NOTE — Progress Notes (Signed)
[name] [name] [place] [address] Phone: [phone] Subjective:   [name], am serving as a scribe for Dr. Antoine Padilla.  I'm seeing this patient by the request  of:  Kim Levins, MD  CC: back and neck follow up   [id number]  [name] is a 72 y.o. female coming in with complaint of back and neck pain. OMT 07/10/2022. Patient states back pain moves around. No new concerns.  Patient states that is having more in discomfort and pain overall at this moment.  Medications patient has been prescribed: Meloxicam, Prednisone  Taking:         Reviewed prior external information including notes and imaging from previsou exam, outside providers and external EMR if available.   As well as notes that were available from care everywhere and other healthcare systems.  Past medical history, social, surgical and family history all reviewed in electronic medical record.  No pertanent information unless stated regarding to the chief complaint.   Past Medical History:  Diagnosis Date   ANXIETY 12/01/2006   Arthritis    Blepharospasm 12/01/2006   BONE SPUR 12/01/2006   Cellulitis and abscess of oral soft tissues 05/01/2008   Cervicalgia 08/24/2008   COLD SORE 05/30/2008   DEPRESSION 12/01/2006   EXCISION OF GANGLION CYST, WRIST, HX OF 12/01/2006   GERD 12/01/2006   HYPERLIPIDEMIA 12/01/2006   HYPERTENSION, BORDERLINE 12/01/2006   LOW BACK PAIN 12/01/2006   MACULAR DEGENERATION 12/01/2006   OTITIS MEDIA, ACUTE, LEFT 05/30/2008   PARESTHESIA 08/24/2008   SINUSITIS- ACUTE-NOS 01/07/2010    Allergies  Allergen Reactions   Codeine    Hydrocodone-Acetaminophen    Loratadine-Pseudoephedrine Er    Statins Other (See Comments)    Joint and muscle cramp   Augmentin [Amoxicillin-Pot Clavulanate] Diarrhea and Nausea And Vomiting     Review of Systems:  No headache, visual changes, nausea, vomiting, diarrhea, constipation, dizziness, abdominal pain,  skin rash, fevers, chills, night sweats, weight loss, swollen lymph nodes, body aches, joint swelling, chest pain, shortness of breath, mood changes. POSITIVE muscle aches  Objective  Blood pressure (!) 152/82, pulse 85, height 5\' 3"  (1.6 m), weight 124 lb (56.2 kg), SpO2 98 %.   General: No apparent distress alert and oriented x3 mood and affect normal, dressed appropriately.  HEENT: Pupils equal, extraocular movements intact  Respiratory: Patient's speak in full sentences and does not appear short of breath  Cardiovascular: No lower extremity edema, non tender, no erythema    Osteopathic findings  C2 flexed rotated and side bent right C5 flexed rotated and side bent left T3 extended rotated and side bent right inhaled rib T8 extended rotated and side bent left L2 flexed rotated and side bent right Sacrum right on right     Assessment and Plan:  SI (sacroiliac) joint dysfunction Chronic problem with worsening symptoms again.  We discussed that we do need to consider an MRI of the back.  Patient declined this.  Given Toradol and Depo-Medrol today.  Discussed with patient needs to try to avoid anti-inflammatories secondary to kidney function.  Patient states that has not been doing as much    Nonallopathic problems  Decision today to treat with OMT was based on Physical Exam  After verbal consent patient was treated with HVLA, ME, FPR techniques in cervical, rib, thoracic, lumbar, and sacral  areas  Patient tolerated the procedure well with improvement in symptoms  Patient given exercises, stretches and lifestyle  modifications  See medications in patient instructions if given  Patient will follow up in 4-8 weeks    The above documentation has been reviewed and is accurate and complete Kim Saa, DO          Note: This dictation was prepared with Dragon dictation along with smaller phrase technology. Any transcriptional errors that result from this process are  unintentional.

## 2022-09-09 ENCOUNTER — Encounter: Payer: Self-pay | Admitting: Family Medicine

## 2022-09-09 ENCOUNTER — Other Ambulatory Visit: Payer: Self-pay | Admitting: Family Medicine

## 2022-09-09 ENCOUNTER — Ambulatory Visit: Payer: Medicare Other | Admitting: Family Medicine

## 2022-09-09 VITALS — BP 152/82 | HR 85 | Ht 63.0 in | Wt 124.0 lb

## 2022-09-09 DIAGNOSIS — M9901 Segmental and somatic dysfunction of cervical region: Secondary | ICD-10-CM

## 2022-09-09 DIAGNOSIS — M9903 Segmental and somatic dysfunction of lumbar region: Secondary | ICD-10-CM | POA: Diagnosis not present

## 2022-09-09 DIAGNOSIS — M533 Sacrococcygeal disorders, not elsewhere classified: Secondary | ICD-10-CM

## 2022-09-09 DIAGNOSIS — M9902 Segmental and somatic dysfunction of thoracic region: Secondary | ICD-10-CM | POA: Diagnosis not present

## 2022-09-09 DIAGNOSIS — M9908 Segmental and somatic dysfunction of rib cage: Secondary | ICD-10-CM | POA: Diagnosis not present

## 2022-09-09 DIAGNOSIS — M9904 Segmental and somatic dysfunction of sacral region: Secondary | ICD-10-CM | POA: Diagnosis not present

## 2022-09-09 MED ORDER — KETOROLAC TROMETHAMINE 30 MG/ML IJ SOLN
30.0000 mg | Freq: Once | INTRAMUSCULAR | Status: AC
Start: 2022-09-09 — End: 2022-09-09
  Administered 2022-09-09: 30 mg via INTRAMUSCULAR

## 2022-09-09 MED ORDER — CYCLOBENZAPRINE HCL 5 MG PO TABS: 5.0000 mg | ORAL_TABLET | Freq: Every evening | ORAL | 0 refills | Status: DC | PRN

## 2022-09-09 MED ORDER — METHYLPREDNISOLONE ACETATE 40 MG/ML IJ SUSP
40.0000 mg | Freq: Once | INTRAMUSCULAR | Status: AC
Start: 2022-09-09 — End: 2022-09-09
  Administered 2022-09-09: 40 mg via INTRAMUSCULAR

## 2022-09-09 NOTE — Patient Instructions (Signed)
Flexeril 5mg  to take at night can take during day if it gets bad Still use meloxicam sparingly Consider MRI of the back See you again in 6-8 weeks

## 2022-09-09 NOTE — Assessment & Plan Note (Addendum)
Chronic problem with worsening symptoms again.  We discussed that we do need to consider an MRI of the back.  Patient declined this.  Given Toradol and Depo-Medrol today.  Discussed with patient needs to try to avoid anti-inflammatories secondary to kidney function.  Patient states that has not been doing as much Flexeril given as well at 5 mg.  Patient did try the 10 mg of her husband previously.  Discussed with patient though may be using this instead of the anti-inflammatories to see how patient does.  If worsening pain to seek medical attention immediately.  Follow-up with me again now in 4 to 5 weeks otherwise.  May be able to start osteopathic manipulation again.

## 2022-10-02 ENCOUNTER — Encounter: Payer: Self-pay | Admitting: Internal Medicine

## 2022-10-02 MED ORDER — CLONAZEPAM 1 MG PO TABS: ORAL_TABLET | ORAL | 5 refills | Status: DC

## 2022-10-02 NOTE — Telephone Encounter (Signed)
Done erx 

## 2022-10-15 NOTE — Progress Notes (Signed)
[name] [address] [place] [address] Phone: [phone] Subjective:   [name], am serving as a scribe for Dr. Antoine Primas.  I'm seeing this patient by the request  of:  Corwin Levins, MD  CC: Back and neck pain follow-up  [id number]  [name] is a 72 y.o. female coming in with complaint of back and neck pain. OMT 09/09/2022. Patient states that was doing well but has been lifting her grandchild a lot while at the beach. Using muscle relaxer and meloxicam. Pain has improved.   Also c/o neck pain. Taking benadryl for sinus pressure that she believes is settling in her neck. Woke up today without pain.   Medications patient has been prescribed: Flexeril, Meloxicam  Taking: Yes         Reviewed prior external information including notes and imaging from previsou exam, outside providers and external EMR if available.   As well as notes that were available from care everywhere and other healthcare systems.  Past medical history, social, surgical and family history all reviewed in electronic medical record.  No pertanent information unless stated regarding to the chief complaint.   Past Medical History:  Diagnosis Date   ANXIETY 12/01/2006   Arthritis    Blepharospasm 12/01/2006   BONE SPUR 12/01/2006   Cellulitis and abscess of oral soft tissues 05/01/2008   Cervicalgia 08/24/2008   COLD SORE 05/30/2008   DEPRESSION 12/01/2006   EXCISION OF GANGLION CYST, WRIST, HX OF 12/01/2006   GERD 12/01/2006   HYPERLIPIDEMIA 12/01/2006   HYPERTENSION, BORDERLINE 12/01/2006   LOW BACK PAIN 12/01/2006   MACULAR DEGENERATION 12/01/2006   OTITIS MEDIA, ACUTE, LEFT 05/30/2008   PARESTHESIA 08/24/2008   SINUSITIS- ACUTE-NOS 01/07/2010    Allergies  Allergen Reactions   Codeine    Hydrocodone-Acetaminophen    Loratadine-Pseudoephedrine Er    Statins Other (See Comments)    Joint and muscle cramp   Augmentin [Amoxicillin-Pot Clavulanate] Diarrhea and  Nausea And Vomiting     Review of Systems:  No headache, visual changes, nausea, vomiting, diarrhea, constipation, dizziness, abdominal pain, skin rash, fevers, chills, night sweats, weight loss, swollen lymph nodes, body aches, joint swelling, chest pain, shortness of breath, mood changes. POSITIVE muscle aches  Objective  Blood pressure 138/80, pulse 89, height 5\' 3"  (1.6 m), weight 122 lb (55.3 kg), SpO2 98 %.   General: No apparent distress alert and oriented x3 mood and affect normal, dressed appropriately.  HEENT: Pupils equal, extraocular movements intact  Respiratory: Patient's speak in full sentences and does not appear short of breath  Cardiovascular: No lower extremity edema, non tender, no erythema  .  Tightness noted in the axial spine from the cervical area down to the lumbar area.  Tightness of the right hip noted.  Tenderness to palpation in the paraspinal musculature diffusely.  Osteopathic findings  C6 flexed rotated and side bent left T9 extended rotated and side bent left inhaled rib L3 flexed rotated and side bent left Sacrum right on right       Assessment and Plan:  Cervicalgia Chronic problem with worsening symptoms again.  Patient was weight gain up with more pain.  More pain in the lower back as well as the neck since lifting her grandchild.  Follow-up again in 6 to 8 weeks because of the worsening pain given Toradol and Depo-Medrol today.  Has had good results with this previously.    Nonallopathic problems  Decision today to  treat with OMT was based on Physical Exam  After verbal consent patient was treated with HVLA, ME, FPR techniques in cervical, rib, thoracic, lumbar, and sacral  areas  Patient tolerated the procedure well with improvement in symptoms  Patient given exercises, stretches and lifestyle modifications  See medications in patient instructions if given  Patient will follow up in 4-8 weeks             Note: This  dictation was prepared with Dragon dictation along with smaller phrase technology. Any transcriptional errors that result from this process are unintentional.

## 2022-10-21 ENCOUNTER — Encounter: Payer: Self-pay | Admitting: Family Medicine

## 2022-10-21 ENCOUNTER — Ambulatory Visit: Payer: Medicare Other | Admitting: Family Medicine

## 2022-10-21 VITALS — BP 138/80 | HR 89 | Ht 63.0 in | Wt 122.0 lb

## 2022-10-21 DIAGNOSIS — M9903 Segmental and somatic dysfunction of lumbar region: Secondary | ICD-10-CM | POA: Diagnosis not present

## 2022-10-21 DIAGNOSIS — M542 Cervicalgia: Secondary | ICD-10-CM

## 2022-10-21 DIAGNOSIS — M9904 Segmental and somatic dysfunction of sacral region: Secondary | ICD-10-CM | POA: Diagnosis not present

## 2022-10-21 DIAGNOSIS — M9908 Segmental and somatic dysfunction of rib cage: Secondary | ICD-10-CM

## 2022-10-21 DIAGNOSIS — M9902 Segmental and somatic dysfunction of thoracic region: Secondary | ICD-10-CM

## 2022-10-21 DIAGNOSIS — M9901 Segmental and somatic dysfunction of cervical region: Secondary | ICD-10-CM

## 2022-10-21 MED ORDER — METHYLPREDNISOLONE ACETATE 40 MG/ML IJ SUSP
40.0000 mg | Freq: Once | INTRAMUSCULAR | Status: AC
  Administered 2022-10-21: 40 mg via INTRAMUSCULAR

## 2022-10-21 MED ORDER — KETOROLAC TROMETHAMINE 30 MG/ML IJ SOLN
30.0000 mg | Freq: Once | INTRAMUSCULAR | Status: AC
  Administered 2022-10-21: 30 mg via INTRAMUSCULAR

## 2022-10-21 NOTE — Assessment & Plan Note (Signed)
Chronic problem with worsening symptoms again.  Patient was weight gain up with more pain.  More pain in the lower back as well as the neck since lifting her grandchild.  Follow-up again in 6 to 8 weeks because of the worsening pain given Toradol and Depo-Medrol today.  Has had good results with this previously.

## 2022-10-21 NOTE — Patient Instructions (Signed)
Injections in backside Careful w grandkids See me in 2 months

## 2022-10-27 ENCOUNTER — Other Ambulatory Visit: Payer: Self-pay

## 2022-10-27 NOTE — Telephone Encounter (Signed)
Medication was already send in

## 2022-10-28 ENCOUNTER — Encounter: Payer: Self-pay | Admitting: Family Medicine

## 2022-10-28 ENCOUNTER — Other Ambulatory Visit: Payer: Self-pay | Admitting: Family Medicine

## 2022-10-29 MED ORDER — CYCLOBENZAPRINE HCL 5 MG PO TABS: 5.0000 mg | ORAL_TABLET | Freq: Every evening | ORAL | 0 refills | Status: DC | PRN

## 2022-11-25 DIAGNOSIS — H25013 Cortical age-related cataract, bilateral: Secondary | ICD-10-CM | POA: Diagnosis not present

## 2022-11-25 DIAGNOSIS — H2513 Age-related nuclear cataract, bilateral: Secondary | ICD-10-CM | POA: Diagnosis not present

## 2022-11-25 DIAGNOSIS — H353131 Nonexudative age-related macular degeneration, bilateral, early dry stage: Secondary | ICD-10-CM | POA: Diagnosis not present

## 2022-12-15 ENCOUNTER — Other Ambulatory Visit: Payer: Self-pay | Admitting: Family Medicine

## 2022-12-15 DIAGNOSIS — H2513 Age-related nuclear cataract, bilateral: Secondary | ICD-10-CM | POA: Diagnosis not present

## 2022-12-15 DIAGNOSIS — H25013 Cortical age-related cataract, bilateral: Secondary | ICD-10-CM | POA: Diagnosis not present

## 2022-12-15 DIAGNOSIS — H353131 Nonexudative age-related macular degeneration, bilateral, early dry stage: Secondary | ICD-10-CM | POA: Diagnosis not present

## 2022-12-16 MED ORDER — CYCLOBENZAPRINE HCL 5 MG PO TABS: 5.0000 mg | ORAL_TABLET | Freq: Every evening | ORAL | 0 refills | Status: DC | PRN

## 2022-12-16 MED ORDER — MELOXICAM 7.5 MG PO TABS: ORAL_TABLET | ORAL | 0 refills | Status: DC

## 2022-12-22 NOTE — Progress Notes (Unsigned)
[name] [address] [place] [address] Phone: [phone] Subjective:   INadine Counts, am serving as a scribe for Dr. Antoine Primas.  I'm seeing this patient by the request  of:  Corwin Levins, MD  CC: Back and neck pain follow-up  [id number]  ZEA NAIK is a 72 y.o. female coming in with complaint of back and neck pain. OMT 10/21/2022. Patient states doing well.  Patient has had tightness overall.  Describes it as a dull, throbbing aching feels at the Toradol and Depo-Medrol can be helpful.  Medications patient has been prescribed: Flexeril, Meloxicam  Taking:         Reviewed prior external information including notes and imaging from previsou exam, outside providers and external EMR if available.   As well as notes that were available from care everywhere and other healthcare systems.  Past medical history, social, surgical and family history all reviewed in electronic medical record.  No pertanent information unless stated regarding to the chief complaint.   Past Medical History:  Diagnosis Date   ANXIETY 12/01/2006   Arthritis    Blepharospasm 12/01/2006   BONE SPUR 12/01/2006   Cellulitis and abscess of oral soft tissues 05/01/2008   Cervicalgia 08/24/2008   COLD SORE 05/30/2008   DEPRESSION 12/01/2006   EXCISION OF GANGLION CYST, WRIST, HX OF 12/01/2006   GERD 12/01/2006   HYPERLIPIDEMIA 12/01/2006   HYPERTENSION, BORDERLINE 12/01/2006   LOW BACK PAIN 12/01/2006   MACULAR DEGENERATION 12/01/2006   OTITIS MEDIA, ACUTE, LEFT 05/30/2008   PARESTHESIA 08/24/2008   SINUSITIS- ACUTE-NOS 01/07/2010    Allergies  Allergen Reactions   Codeine    Hydrocodone-Acetaminophen    Loratadine-Pseudoephedrine Er    Statins Other (See Comments)    Joint and muscle cramp   Augmentin [Amoxicillin-Pot Clavulanate] Diarrhea and Nausea And Vomiting     Review of Systems:  No headache, visual changes, nausea, vomiting, diarrhea, constipation,  dizziness, abdominal pain, skin rash, fevers, chills, night sweats, weight loss, swollen lymph nodes, body aches, joint swelling, chest pain, shortness of breath, mood changes. POSITIVE muscle aches  Objective  Blood pressure (!) 142/80, pulse 90, height 5\' 3"  (1.6 m), weight 122 lb (55.3 kg), SpO2 99%.   General: No apparent distress alert and oriented x3 mood and affect normal, dressed appropriately.  HEENT: Pupils equal, extraocular movements intact  Respiratory: Patient's speak in full sentences and does not appear short of breath  Cardiovascular: No lower extremity edema, non tender, no erythema  Significant tightness noted.  Seems to be more in the paraspinal musculature of the lumbar spine.  Seems to be right greater than left.  Seems to have significant difficulty over the remaining tenderness over the sacroiliac joint.  Some limited sidebending of the neck also noted.  Osteopathic findings  C2 flexed rotated and side bent right very painful no masses C7 flexed rotated and side bent left T3 extended rotated and side bent right inhaled rib T9 extended rotated and side bent left L1 flexed rotated and side bent right Sacrum right on right    Assessment and Plan:  SI (sacroiliac) joint dysfunction Chronic, will continue to monitor.  Has done well with Toradol injections as well as Depo-Medrol which was given injections.  Will consider the possibility of repeating these injections as well in the sacroiliac joints if needed.  Follow-up with me again in 6 to 8 weeks otherwise    Nonallopathic problems  Decision today to treat with  OMT was based on Physical Exam  After verbal consent patient was treated with HVLA, ME, FPR techniques in cervical, rib, thoracic, lumbar, and sacral  areas  Patient tolerated the procedure well with improvement in symptoms  Patient given exercises, stretches and lifestyle modifications  See medications in patient instructions if given  Patient will  follow up in 4-8 weeks     The above documentation has been reviewed and is accurate and complete [name], DO         Note: This dictation was prepared with Dragon dictation along with smaller phrase technology. Any transcriptional errors that result from this process are unintentional.

## 2022-12-23 ENCOUNTER — Ambulatory Visit: Payer: Medicare Other | Admitting: Family Medicine

## 2022-12-23 ENCOUNTER — Encounter: Payer: Self-pay | Admitting: Family Medicine

## 2022-12-23 VITALS — BP 142/80 | HR 90 | Ht 63.0 in | Wt 122.0 lb

## 2022-12-23 DIAGNOSIS — M9904 Segmental and somatic dysfunction of sacral region: Secondary | ICD-10-CM | POA: Diagnosis not present

## 2022-12-23 DIAGNOSIS — M533 Sacrococcygeal disorders, not elsewhere classified: Secondary | ICD-10-CM

## 2022-12-23 DIAGNOSIS — M9902 Segmental and somatic dysfunction of thoracic region: Secondary | ICD-10-CM | POA: Diagnosis not present

## 2022-12-23 DIAGNOSIS — M9903 Segmental and somatic dysfunction of lumbar region: Secondary | ICD-10-CM

## 2022-12-23 DIAGNOSIS — M9908 Segmental and somatic dysfunction of rib cage: Secondary | ICD-10-CM | POA: Diagnosis not present

## 2022-12-23 DIAGNOSIS — M9901 Segmental and somatic dysfunction of cervical region: Secondary | ICD-10-CM | POA: Diagnosis not present

## 2022-12-23 MED ORDER — KETOROLAC TROMETHAMINE 60 MG/2ML IM SOLN
60.0000 mg | Freq: Once | INTRAMUSCULAR | Status: AC
Start: 2022-12-23 — End: 2022-12-23
  Administered 2022-12-23: 60 mg via INTRAMUSCULAR

## 2022-12-23 MED ORDER — METHYLPREDNISOLONE ACETATE 80 MG/ML IJ SUSP
80.0000 mg | Freq: Once | INTRAMUSCULAR | Status: AC
Start: 2022-12-23 — End: 2022-12-23
  Administered 2022-12-23: 80 mg via INTRAMUSCULAR

## 2022-12-23 NOTE — Patient Instructions (Signed)
Good to see you Injection in backside today Follow up in 2-3 months

## 2022-12-23 NOTE — Assessment & Plan Note (Signed)
Chronic, will continue to monitor.  Has done well with Toradol injections as well as Depo-Medrol which was given injections.  Will consider the possibility of repeating these injections as well in the sacroiliac joints if needed.  Follow-up with me again in 6 to 8 weeks otherwise

## 2023-01-12 ENCOUNTER — Other Ambulatory Visit: Payer: Self-pay | Admitting: Family Medicine

## 2023-01-15 ENCOUNTER — Ambulatory Visit (INDEPENDENT_AMBULATORY_CARE_PROVIDER_SITE_OTHER): Payer: Medicare Other

## 2023-01-15 VITALS — Ht 63.0 in | Wt 122.0 lb

## 2023-01-15 DIAGNOSIS — Z0000 Encounter for general adult medical examination without abnormal findings: Secondary | ICD-10-CM

## 2023-01-15 NOTE — Progress Notes (Signed)
Subjective:   [name] is a 72 y.o. female who presents for Medicare Annual (Subsequent) preventive examination.  Visit Complete: Virtual  I connected with  [name] on 01/15/23 by a audio enabled telemedicine application and verified that I am speaking with the correct person using two identifiers.  Patient Location: Home  Provider Location: Home Office  I discussed the limitations of evaluation and management by telemedicine. The patient expressed understanding and agreed to proceed.  Vital Signs: Unable to obtain new vitals due to this being a telehealth visit.   Cardiac Risk Factors include: advanced age (>45men, >52 women);smoking/ tobacco exposure;hypertension     Objective:    Today's Vitals   01/15/23 1133  Weight: 122 lb (55.3 kg)  Height: 5\' 3"  (1.6 m)   Body mass index is 21.61 kg/m.     01/15/2023   11:45 AM 01/31/2022    1:08 PM  Advanced Directives  Does Patient Have a Medical Advance Directive? No Yes  Type of Special educational needs teacher of [place];Living will  Copy of Healthcare Power of Attorney in Chart?  No - copy requested  Would patient like information on creating a medical advance directive? No - Patient declined     Current Medications (verified) Outpatient Encounter Medications as of 01/15/2023  Medication Sig   amLODipine (NORVASC) 5 MG tablet Take 1 tablet (5 mg total) by mouth daily.   aspirin 81 MG tablet Take 81 mg by mouth daily.   clonazePAM (KLONOPIN) 1 MG tablet TAKE 1/2 TO 1 TABLET BY MOUTH TWICE DAILY AS NEEDED   cyclobenzaprine (FLEXERIL) 5 MG tablet Take 1 tablet (5 mg total) by mouth at bedtime as needed for muscle spasms. (Patient not taking: Reported on 01/15/2023)   Evolocumab (REPATHA SURECLICK) 140 MG/ML SOAJ Take 1 injection every 2 weeks   famotidine (PEPCID) 20 MG tablet Take 1 tablet (20 mg total) by mouth 2 (two) times daily.   FLUoxetine (PROZAC) 40 MG capsule Take 40 mg by mouth daily. (Patient not  taking: Reported on 01/15/2023)   hydrochlorothiazide (MICROZIDE) 12.5 MG capsule Take 1 capsule (12.5 mg total) by mouth daily.   losartan (COZAAR) 100 MG tablet Take 1 tablet (100 mg total) by mouth daily.   meloxicam (MOBIC) 7.5 MG tablet TAKE 1 TABLET(7.5 MG) BY MOUTH DAILY   predniSONE (DELTASONE) 20 MG tablet Take 2 tablets (40 mg total) by mouth daily with breakfast. (Patient not taking: Reported on 01/15/2023)   tiZANidine (ZANAFLEX) 2 MG tablet Take 2 mg by mouth at bedtime.   valACYclovir (VALTREX) 1000 MG tablet Take 1 tablet (1,000 mg total) by mouth 2 (two) times daily as needed.   No facility-administered encounter medications on file as of 01/15/2023.    Allergies (verified) Codeine, Hydrocodone-acetaminophen, Loratadine-pseudoephedrine er, Statins, and Augmentin [amoxicillin-pot clavulanate]   History: Past Medical History:  Diagnosis Date   ANXIETY 12/01/2006   Arthritis    Blepharospasm 12/01/2006   BONE SPUR 12/01/2006   Cellulitis and abscess of oral soft tissues 05/01/2008   Cervicalgia 08/24/2008   COLD SORE 05/30/2008   DEPRESSION 12/01/2006   EXCISION OF GANGLION CYST, WRIST, HX OF 12/01/2006   GERD 12/01/2006   HYPERLIPIDEMIA 12/01/2006   HYPERTENSION, BORDERLINE 12/01/2006   LOW BACK PAIN 12/01/2006   MACULAR DEGENERATION 12/01/2006   OTITIS MEDIA, ACUTE, LEFT 05/30/2008   PARESTHESIA 08/24/2008   SINUSITIS- ACUTE-NOS 01/07/2010   Past Surgical History:  Procedure Laterality Date   ABDOMINAL HYSTERECTOMY  blepharospasm     bone spur     removal right foot   BREAST LUMPECTOMY     right   GANGLION CYST EXCISION     H/O Macular degeneration     Family History  Problem Relation Age of Onset   Hypertension Mother    Pulmonary fibrosis Mother    Heart disease Father    Heart attack Father    Diabetes Daughter    Social History   Socioeconomic History   Marital status: Married    Spouse name: Not on file   Number of children: 1   Years of education: Not on file    Highest education level: 12th grade  Occupational History    Employer: [employer]  Tobacco Use   Smoking status: Every Day   Smokeless tobacco: Never  Vaping Use   Vaping status: Never Used  Substance and Sexual Activity   Alcohol use: No   Drug use: No   Sexual activity: Yes    Comment: 1st intercourse- 59, partners-  married- 30 yrs   Other Topics Concern   Not on file  Social History Narrative   Not on file   Social Determinants of Health   Financial Resource Strain: Low Risk  (01/15/2023)   Overall Financial Resource Strain (CARDIA)    Difficulty of Paying Living Expenses: Not hard at all  Food Insecurity: No Food Insecurity (01/15/2023)   Hunger Vital Sign    Worried About Running Out of Food in the Last Year: Never true    Ran Out of Food in the Last Year: Never true  Transportation Needs: No Transportation Needs (01/15/2023)   PRAPARE - Administrator, Civil Service (Medical): No    Lack of Transportation (Non-Medical): No  Physical Activity: Insufficiently Active (01/15/2023)   Exercise Vital Sign    Days of Exercise per Week: 5 days    Minutes of Exercise per Session: 20 min  Stress: No Stress Concern Present (01/15/2023)   [employer] of Occupational Health - Occupational Stress Questionnaire    Feeling of Stress : Not at all  Social Connections: Socially Integrated (01/15/2023)   Social Connection and Isolation Panel [NHANES]    Frequency of Communication with Friends and Family: More than three times a week    Frequency of Social Gatherings with Friends and Family: More than three times a week    Attends Religious Services: More than 4 times per year    Active Member of [employer] or Organizations: Yes    Attends Engineer, structural: More than 4 times per year    Marital Status: Married    Tobacco Counseling Ready to quit: Yes Counseling given: Yes   Clinical Intake:  Pre-visit preparation completed: Yes  Pain : No/denies  pain     BMI - recorded: 21.61 Nutritional Status: BMI of 19-24  Normal Nutritional Risks: None Diabetes: No  How often do you need to have someone help you when you read instructions, pamphlets, or other written materials from your doctor or pharmacy?: 1 - Never  Interpreter Needed?: No  Information entered by :: Theresa Mulligan LPN   Activities of Daily Living    01/15/2023   11:43 AM 01/31/2022    1:08 PM  In your present state of health, do you have any difficulty performing the following activities:  Hearing? 0 0  Vision? 0 0  Difficulty concentrating or making decisions? 0 0  Walking or climbing stairs? 0 0  Dressing or  bathing? 0 0  Doing errands, shopping? 0 0  Preparing Food and eating ? N N  Using the Toilet? N N  In the past six months, have you accidently leaked urine? N N  Do you have problems with loss of bowel control? N N  Managing your Medications? N N  Managing your Finances? N N  Housekeeping or managing your Housekeeping? N N    Patient Care Team: Corwin Levins, MD as PCP - General  Indicate any recent Medical Services you may have received from other than [facility] providers in the past year (date may be approximate).     Assessment:   This is a routine wellness examination for Marshayla.  Hearing/Vision screen Hearing Screening - Comments:: Denies hearing difficulties   Vision Screening - Comments:: Wears rx glasses - up to date with routine eye exams with  Dr Carlynn Purl   Goals Addressed               This Visit's Progress     Stay Healthy (pt-stated)         Depression Screen    01/15/2023   11:39 AM 05/01/2022    2:00 PM 05/01/2022    1:25 PM 03/05/2022    1:27 PM 01/31/2022    1:07 PM 06/06/2021    1:32 PM 06/06/2021    1:10 PM  PHQ 2/9 Scores  PHQ - 2 Score 0 0 0 1 0 1 0  PHQ- 9 Score    1       Fall Risk    01/15/2023   11:44 AM 05/01/2022    1:59 PM 05/01/2022    1:24 PM 03/05/2022    1:27 PM 01/31/2022    1:04 PM  Fall Risk   Falls in  the past year? 0 1 1 0 0  Number falls in past yr: 0 0 0  0  Injury with Fall? 0 0 0  0  Risk for fall due to : No Fall Risks  No Fall Risks No Fall Risks No Fall Risks  Follow up Falls prevention discussed  Falls evaluation completed Falls evaluation completed Falls prevention discussed    MEDICARE RISK AT HOME: Medicare Risk at Home Any stairs in or around the home?: No If so, are there any without handrails?: No Home free of loose throw rugs in walkways, pet beds, electrical cords, etc?: Yes Adequate lighting in your home to reduce risk of falls?: Yes Life alert?: No Use of a cane, walker or w/c?: No Grab bars in the bathroom?: Yes Shower chair or bench in shower?: Yes Elevated toilet seat or a handicapped toilet?: Yes  TIMED UP AND GO:  Was the test performed?  No    Cognitive Function:        01/15/2023   11:45 AM 01/31/2022    1:09 PM  6CIT Screen  What Year? 0 points 0 points  What month? 0 points 0 points  What time? 0 points 0 points  Count back from 20 0 points 0 points  Months in reverse 0 points 0 points  Repeat phrase 0 points 2 points  Total Score 0 points 2 points    Immunizations Immunization History  Administered Date(s) Administered   DTaP 04/04/2022   Fluad Quad(high Dose 65+) 05/07/2020, 06/06/2021   Influenza Nasal 01/19/2016   Influenza-Unspecified 01/26/2014, 02/02/2015, 03/08/2018   PNEUMOCOCCAL CONJUGATE-20 06/06/2021   Td 08/23/2009   Zoster Recombinant(Shingrix) 07/29/2021   Zoster, Unspecified 04/11/2022, 04/11/2022    TDAP  status: Up to date  Flu Vaccine status: Due, Education has been provided regarding the importance of this vaccine. Advised may receive this vaccine at local pharmacy or Health Dept. Aware to provide a copy of the vaccination record if obtained from local pharmacy or Health Dept. Verbalized acceptance and understanding.  Pneumococcal vaccine status: Up to date  Covid-19 vaccine status: Declined, Education has been  provided regarding the importance of this vaccine but patient still declined. Advised may receive this vaccine at local pharmacy or Health Dept.or vaccine clinic. Aware to provide a copy of the vaccination record if obtained from local pharmacy or Health Dept. Verbalized acceptance and understanding.  Qualifies for Shingles Vaccine? Yes   Zostavax completed Yes   Shingrix Completed?: Yes  Screening Tests Health Maintenance  Topic Date Due   DEXA SCAN  Never done   MAMMOGRAM  02/03/2019   INFLUENZA VACCINE  11/27/2022   COVID-19 Vaccine (1 - 2023-24 season) Never done   Colonoscopy  05/02/2023 (Originally 09/02/1997)   Medicare Annual Wellness (AWV)  01/15/2024   DTaP/Tdap/Td (3 - Tdap) 04/04/2032   Pneumonia Vaccine 73+ Years old  Completed   Hepatitis C Screening  Completed   Zoster Vaccines- Shingrix  Completed   HPV VACCINES  Aged Out    Health Maintenance  Health Maintenance Due  Topic Date Due   DEXA SCAN  Never done   MAMMOGRAM  02/03/2019   INFLUENZA VACCINE  11/27/2022   COVID-19 Vaccine (1 - 2023-24 season) Never done    Colorectal cancer screening: Referral to GI placed Deferred. Pt aware the office will call re: appt.  Mammogram status: Ordered scheduled for 01/23/23. Pt provided with contact info and advised to call to schedule appt.   Bone Density status: Ordered scheduled for 01/23/23. Pt provided with contact info and advised to call to schedule appt.  Lung Cancer Screening: (Low Dose CT Chest recommended if Age 27-80 years, 20 pack-year currently smoking OR have quit w/in 15years.) does qualify.   Lung Cancer Screening Referral: Deferred  Additional Screening:  Hepatitis C Screening: does qualify; Completed 03/13/16  Vision Screening: Recommended annual ophthalmology exams for early detection of glaucoma and other disorders of the eye. Is the patient up to date with their annual eye exam?  Yes  Who is the provider or what is the name of the office in which  the patient attends annual eye exams? Dr Carlynn Purl If pt is not established with a provider, would they like to be referred to a provider to establish care? No .   Dental Screening: Recommended annual dental exams for proper oral hygiene    Community Resource Referral / Chronic Care Management:  CRR required this visit?  No   CCM required this visit?  Appt scheduled with PCP     Plan:     I have personally reviewed and noted the following in the patient's chart:   Medical and social history Use of alcohol, tobacco or illicit drugs  Current medications and supplements including opioid prescriptions. Patient is not currently taking opioid prescriptions. Functional ability and status Nutritional status Physical activity Advanced directives List of other physicians Hospitalizations, surgeries, and ER visits in previous 12 months Vitals Screenings to include cognitive, depression, and falls Referrals and appointments  In addition, I have reviewed and discussed with patient certain preventive protocols, quality metrics, and best practice recommendations. A written personalized care plan for preventive services as well as general preventive health recommendations were provided to patient.  Tillie Rung, LPN   [id number]/[id number]   After Visit Summary: (MyChart) Due to this being a telephonic visit, the after visit summary with patients personalized plan was offered to patient via MyChart   Nurse Notes: None

## 2023-01-15 NOTE — Patient Instructions (Addendum)
[name] , Thank you for taking time to come for your Medicare Wellness Visit. I appreciate your ongoing commitment to your health goals. Please review the following plan we discussed and let me know if I can assist you in the future.   Referrals/Orders/Follow-Ups/Clinician Recommendations:   This is a list of the screening recommended for you and due dates:  Health Maintenance  Topic Date Due   DEXA scan (bone density measurement)  Never done   Mammogram  02/03/2019   Flu Shot  11/27/2022   COVID-19 Vaccine (1 - 2023-24 season) Never done   Colon Cancer Screening  05/02/2023*   Medicare Annual Wellness Visit  01/15/2024   DTaP/Tdap/Td vaccine (3 - Tdap) 04/04/2032   Pneumonia Vaccine  Completed   Hepatitis C Screening  Completed   Zoster (Shingles) Vaccine  Completed   HPV Vaccine  Aged Out  *Topic was postponed. The date shown is not the original due date.    Advanced directives: (Declined) Advance directive discussed with you today. Even though you declined this today, please call our office should you change your mind, and we can give you the proper paperwork for you to fill out.  Next Medicare Annual Wellness Visit scheduled for next year: Yes

## 2023-01-23 ENCOUNTER — Other Ambulatory Visit: Payer: Self-pay | Admitting: Internal Medicine

## 2023-01-23 ENCOUNTER — Inpatient Hospital Stay: Admission: RE | Admit: 2023-01-23 | Payer: Medicare Other | Source: Ambulatory Visit

## 2023-01-23 ENCOUNTER — Ambulatory Visit: Payer: Medicare Other

## 2023-01-23 DIAGNOSIS — Z1231 Encounter for screening mammogram for malignant neoplasm of breast: Secondary | ICD-10-CM

## 2023-01-23 DIAGNOSIS — Z780 Asymptomatic menopausal state: Secondary | ICD-10-CM

## 2023-02-02 ENCOUNTER — Encounter: Payer: Self-pay | Admitting: Internal Medicine

## 2023-02-02 ENCOUNTER — Ambulatory Visit (INDEPENDENT_AMBULATORY_CARE_PROVIDER_SITE_OTHER): Payer: Medicare Other | Admitting: Internal Medicine

## 2023-02-02 ENCOUNTER — Ambulatory Visit: Payer: Medicare Other | Admitting: Internal Medicine

## 2023-02-02 VITALS — BP 128/76 | HR 64 | Temp 98.0°F | Ht 63.0 in | Wt 123.0 lb

## 2023-02-02 DIAGNOSIS — E559 Vitamin D deficiency, unspecified: Secondary | ICD-10-CM | POA: Diagnosis not present

## 2023-02-02 DIAGNOSIS — J309 Allergic rhinitis, unspecified: Secondary | ICD-10-CM

## 2023-02-02 DIAGNOSIS — R739 Hyperglycemia, unspecified: Secondary | ICD-10-CM

## 2023-02-02 DIAGNOSIS — F17200 Nicotine dependence, unspecified, uncomplicated: Secondary | ICD-10-CM | POA: Diagnosis not present

## 2023-02-02 DIAGNOSIS — E782 Mixed hyperlipidemia: Secondary | ICD-10-CM

## 2023-02-02 DIAGNOSIS — Z23 Encounter for immunization: Secondary | ICD-10-CM | POA: Diagnosis not present

## 2023-02-02 DIAGNOSIS — I10 Essential (primary) hypertension: Secondary | ICD-10-CM | POA: Diagnosis not present

## 2023-02-02 DIAGNOSIS — J4531 Mild persistent asthma with (acute) exacerbation: Secondary | ICD-10-CM | POA: Diagnosis not present

## 2023-02-02 DIAGNOSIS — N1831 Chronic kidney disease, stage 3a: Secondary | ICD-10-CM

## 2023-02-02 LAB — CBC WITH DIFFERENTIAL/PLATELET
Basophils Absolute: 0.1 10*3/uL (ref 0.0–0.1)
Basophils Relative: 0.8 % (ref 0.0–3.0)
Eosinophils Absolute: 0.2 10*3/uL (ref 0.0–0.7)
Eosinophils Relative: 1.6 % (ref 0.0–5.0)
HCT: 40.5 % (ref 36.0–46.0)
Hemoglobin: 13.3 g/dL (ref 12.0–15.0)
Lymphocytes Relative: 27.2 % (ref 12.0–46.0)
Lymphs Abs: 3.1 10*3/uL (ref 0.7–4.0)
MCHC: 32.8 g/dL (ref 30.0–36.0)
MCV: 90.7 fL (ref 78.0–100.0)
Monocytes Absolute: 0.6 10*3/uL (ref 0.1–1.0)
Monocytes Relative: 5.6 % (ref 3.0–12.0)
Neutro Abs: 7.4 10*3/uL (ref 1.4–7.7)
Neutrophils Relative %: 64.8 % (ref 43.0–77.0)
Platelets: 282 10*3/uL (ref 150.0–400.0)
RBC: 4.47 Mil/uL (ref 3.87–5.11)
RDW: 13.6 % (ref 11.5–15.5)
WBC: 11.5 10*3/uL — ABNORMAL HIGH (ref 4.0–10.5)

## 2023-02-02 LAB — LIPID PANEL
Cholesterol: 186 mg/dL (ref 0–200)
HDL: 61.7 mg/dL (ref >39.00)
LDL Cholesterol: 83 mg/dL (ref 0–99)
NonHDL: 124.34
Total CHOL/HDL Ratio: 3
Triglycerides: 209 mg/dL — ABNORMAL HIGH (ref 0.0–149.0)
VLDL: 41.8 mg/dL — ABNORMAL HIGH (ref 0.0–40.0)

## 2023-02-02 LAB — HEMOGLOBIN A1C: Hgb A1c MFr Bld: 5.7 % (ref 4.6–6.5)

## 2023-02-02 LAB — BASIC METABOLIC PANEL
BUN: 16 mg/dL (ref 6–23)
CO2: 28 meq/L (ref 19–32)
Calcium: 9.6 mg/dL (ref 8.4–10.5)
Chloride: 103 meq/L (ref 96–112)
Creatinine, Ser: 1.23 mg/dL — ABNORMAL HIGH (ref 0.40–1.20)
GFR: 44.55 mL/min — ABNORMAL LOW (ref >60.00)
Glucose, Bld: 92 mg/dL (ref 70–99)
Potassium: 3.9 meq/L (ref 3.5–5.1)
Sodium: 138 meq/L (ref 135–145)

## 2023-02-02 LAB — HEPATIC FUNCTION PANEL
ALT: 7 U/L (ref 0–35)
AST: 15 U/L (ref 0–37)
Albumin: 4.3 g/dL (ref 3.5–5.2)
Alkaline Phosphatase: 69 U/L (ref 39–117)
Bilirubin, Direct: 0.1 mg/dL (ref 0.0–0.3)
Total Bilirubin: 0.4 mg/dL (ref 0.2–1.2)
Total Protein: 7.2 g/dL (ref 6.0–8.3)

## 2023-02-02 MED ORDER — PREDNISONE 10 MG PO TABS: ORAL_TABLET | ORAL | 0 refills | Status: DC

## 2023-02-02 NOTE — Patient Instructions (Signed)
Please take all new medication as prescribed  - the prednisone  You can also take OTC Allegra and Nasacort for allergies in the long term as well  Please continue all other medications as before, and refills have been done if requested.  Please have the pharmacy call with any other refills you may need.  Please keep your appointments with your specialists as you may have planned  Please go to the LAB at the blood drawing area for the tests to be done  You will be contacted by phone if any changes need to be made immediately.  Otherwise, you will receive a letter about your results with an explanation, but please check with MyChart first.  Please make an Appointment to return in 6 months, or sooner if needed

## 2023-02-02 NOTE — Progress Notes (Signed)
The test results show that your current treatment is OK, as the tests are stable.  Please continue the same plan.  There is no other need for change of treatment or further evaluation based on these results, at this time.  thanks 

## 2023-02-02 NOTE — Progress Notes (Unsigned)
Patient ID: [name], female   DOB: 10-Jun-1950, 72 y.o.   MRN: [id number]        Chief Complaint: follow up HTN, HLD and hyperglycemia , ckd3a, asthma exacerbation       HPI:  [name] is a 72 y.o. female here with c/o 3 days onset worsening afeb non prod cough with mild wheezing, sob.  Pt denies chest pain, orthopnea, PND, increased LE swelling, palpitations, dizziness or syncope.   Pt denies polydipsia, polyuria, or new focal neuro s/s.    Pt denies fever, wt loss, night sweats, loss of appetite, or other constitutional symptoms  Not taking Vit D         Wt Readings from Last 3 Encounters:  02/02/23 123 lb (55.8 kg)  01/15/23 122 lb (55.3 kg)  12/23/22 122 lb (55.3 kg)   BP Readings from Last 3 Encounters:  02/02/23 128/76  12/23/22 (!) 142/80  10/21/22 138/80         Past Medical History:  Diagnosis Date   ANXIETY 12/01/2006   Arthritis    Blepharospasm 12/01/2006   BONE SPUR 12/01/2006   Cellulitis and abscess of oral soft tissues 05/01/2008   Cervicalgia 08/24/2008   COLD SORE 05/30/2008   DEPRESSION 12/01/2006   EXCISION OF GANGLION CYST, WRIST, HX OF 12/01/2006   GERD 12/01/2006   HYPERLIPIDEMIA 12/01/2006   HYPERTENSION, BORDERLINE 12/01/2006   LOW BACK PAIN 12/01/2006   MACULAR DEGENERATION 12/01/2006   OTITIS MEDIA, ACUTE, LEFT 05/30/2008   PARESTHESIA 08/24/2008   SINUSITIS- ACUTE-NOS 01/07/2010   Past Surgical History:  Procedure Laterality Date   ABDOMINAL HYSTERECTOMY     blepharospasm     bone spur     removal right foot   BREAST LUMPECTOMY     right   GANGLION CYST EXCISION     H/O Macular degeneration      reports that she has been smoking. She has never used smokeless tobacco. She reports that she does not drink alcohol and does not use drugs. family history includes Diabetes in her daughter; Heart attack in her father; Heart disease in her father; Hypertension in her mother; Pulmonary fibrosis in her mother. Allergies  Allergen Reactions   Codeine     Hydrocodone-Acetaminophen    Loratadine-Pseudoephedrine Er    Statins Other (See Comments)    Joint and muscle cramp   Augmentin [Amoxicillin-Pot Clavulanate] Diarrhea and Nausea And Vomiting   Current Outpatient Medications on File Prior to Visit  Medication Sig Dispense Refill   amLODipine (NORVASC) 5 MG tablet Take 1 tablet (5 mg total) by mouth daily. 90 tablet 3   aspirin 81 MG tablet Take 81 mg by mouth daily.     clonazePAM (KLONOPIN) 1 MG tablet TAKE 1/2 TO 1 TABLET BY MOUTH TWICE DAILY AS NEEDED 60 tablet 5   cyclobenzaprine (FLEXERIL) 5 MG tablet Take 1 tablet (5 mg total) by mouth at bedtime as needed for muscle spasms. 30 tablet 0   Evolocumab (REPATHA SURECLICK) 140 MG/ML SOAJ Take 1 injection every 2 weeks 2 mL 11   famotidine (PEPCID) 20 MG tablet Take 1 tablet (20 mg total) by mouth 2 (two) times daily. 180 tablet 1   FLUoxetine (PROZAC) 40 MG capsule Take 40 mg by mouth daily.     hydrochlorothiazide (MICROZIDE) 12.5 MG capsule Take 1 capsule (12.5 mg total) by mouth daily. 90 capsule 3   losartan (COZAAR) 100 MG tablet Take 1 tablet (100 mg total) by mouth daily. 90  tablet 3   meloxicam (MOBIC) 7.5 MG tablet TAKE 1 TABLET(7.5 MG) BY MOUTH DAILY 30 tablet 0   tiZANidine (ZANAFLEX) 2 MG tablet Take 2 mg by mouth at bedtime.     valACYclovir (VALTREX) 1000 MG tablet Take 1 tablet (1,000 mg total) by mouth 2 (two) times daily as needed. 60 tablet 0   No current facility-administered medications on file prior to visit.        ROS:  All others reviewed and negative.  Objective        PE:  BP 128/76 (BP Location: Right Arm, Patient Position: Sitting, Cuff Size: Normal)   Pulse 64   Temp 98 F (36.7 C) (Oral)   Ht 5\' 3"  (1.6 m)   Wt 123 lb (55.8 kg)   SpO2 99%   BMI 21.79 kg/m                 Constitutional: Pt appears in NAD               HENT: Head: NCAT.                Right Ear: External ear normal.                 Left Ear: External ear normal.                 Eyes: . Pupils are equal, round, and reactive to light. Conjunctivae and EOM are normal               Nose: without d/c or deformity               Neck: Neck supple. Gross normal ROM               Cardiovascular: Normal rate and regular rhythm.                 Pulmonary/Chest: Effort normal and breath sounds without rales or wheezing.                Abd:  Soft, NT, ND, + BS, no organomegaly               Neurological: Pt is alert. At baseline orientation, motor grossly intact               Skin: Skin is warm. No rashes, no other new lesions, LE edema - none               Psychiatric: Pt behavior is normal without agitation   Micro: none  Cardiac tracings I have personally interpreted today:  none  Pertinent Radiological findings (summarize): none   Lab Results  Component Value Date   WBC 11.5 (H) 02/02/2023   HGB 13.3 02/02/2023   HCT 40.5 02/02/2023   PLT 282.0 02/02/2023   GLUCOSE 92 02/02/2023   CHOL 186 02/02/2023   TRIG 209.0 (H) 02/02/2023   HDL 61.70 02/02/2023   LDLDIRECT 106.0 06/06/2021   LDLCALC 83 02/02/2023   ALT 7 02/02/2023   AST 15 02/02/2023   NA 138 02/02/2023   K 3.9 02/02/2023   CL 103 02/02/2023   CREATININE 1.23 (H) 02/02/2023   BUN 16 02/02/2023   CO2 28 02/02/2023   TSH 2.55 08/01/2022   HGBA1C 5.7 02/02/2023   Assessment/Plan:  [name] is a 72 y.o. White or Caucasian [1] female with  has a past medical history of ANXIETY (12/01/2006), Arthritis, Blepharospasm (12/01/2006), BONE SPUR (12/01/2006), Cellulitis and abscess  of oral soft tissues (05/01/2008), Cervicalgia (08/24/2008), COLD SORE (05/30/2008), DEPRESSION (12/01/2006), EXCISION OF GANGLION CYST, WRIST, HX OF (12/01/2006), GERD (12/01/2006), HYPERLIPIDEMIA (12/01/2006), HYPERTENSION, BORDERLINE (12/01/2006), LOW BACK PAIN (12/01/2006), MACULAR DEGENERATION (12/01/2006), OTITIS MEDIA, ACUTE, LEFT (05/30/2008), PARESTHESIA (08/24/2008), and SINUSITIS- ACUTE-NOS (01/07/2010).  Hyperlipidemia Lab Results  Component  Value Date   LDLCALC 83 02/02/2023   uncontrolled, pt to continue current repatha 140 mg   Essential hypertension BP Readings from Last 3 Encounters:  02/02/23 128/76  12/23/22 (!) 142/80  10/21/22 138/80   Stable, pt to continue medical treatment norvasc 5 every day, hct 12.5 every day, losartan 100 qd   Smoker Pt counsled to quit, pt not ready  Allergic rhinitis Mild to mod, for allegra and nasacort asd, to f/u any worsening symptoms or concerns  Vitamin D deficiency Last vitamin D Lab Results  Component Value Date   VD25OH 34.74 08/01/2022   Low, to start oral replacement   CKD (chronic kidney disease) stage 3, GFR 30-59 ml/min (HCC) Lab Results  Component Value Date   CREATININE 1.23 (H) 02/02/2023   Stable overall, cont to avoid nephrotoxins   Asthma exacerbation Mild to mod, for for prednisone taper,,  to f/u any worsening symptoms or concerns  Followup: Return in about 6 months (around 08/03/2023).  Oliver Barre, MD 02/04/2023 8:37 PM [facility]

## 2023-02-04 ENCOUNTER — Telehealth: Payer: Self-pay | Admitting: Internal Medicine

## 2023-02-04 ENCOUNTER — Encounter: Payer: Self-pay | Admitting: Internal Medicine

## 2023-02-04 DIAGNOSIS — J45901 Unspecified asthma with (acute) exacerbation: Secondary | ICD-10-CM | POA: Insufficient documentation

## 2023-02-04 NOTE — Assessment & Plan Note (Signed)
Pt counsled to quit, pt not ready °

## 2023-02-04 NOTE — Telephone Encounter (Signed)
Will be on the look out.

## 2023-02-04 NOTE — Assessment & Plan Note (Signed)
BP Readings from Last 3 Encounters:  02/02/23 128/76  12/23/22 (!) 142/80  10/21/22 138/80   Stable, pt to continue medical treatment norvasc 5 every day, hct 12.5 every day, losartan 100 qd

## 2023-02-04 NOTE — Assessment & Plan Note (Signed)
Lab Results  Component Value Date   CREATININE 1.23 (H) 02/02/2023   Stable overall, cont to avoid nephrotoxins

## 2023-02-04 NOTE — Telephone Encounter (Signed)
FYI.Marland KitchenMarland KitchenMarland KitchenPt called and send fax back in this morning. Please advise.

## 2023-02-04 NOTE — Assessment & Plan Note (Signed)
Mild to mod, for allegra and nasacort asd,   to f/u any worsening symptoms or concerns 

## 2023-02-04 NOTE — Assessment & Plan Note (Signed)
Last vitamin D Lab Results  Component Value Date   VD25OH 34.74 08/01/2022   Low, to start oral replacement

## 2023-02-04 NOTE — Assessment & Plan Note (Signed)
Lab Results  Component Value Date   LDLCALC 83 02/02/2023   uncontrolled, pt to continue current repatha 140 mg

## 2023-02-04 NOTE — Assessment & Plan Note (Signed)
Mild to mod, for for prednisone taper,,  to f/u any worsening symptoms or concerns

## 2023-02-05 ENCOUNTER — Other Ambulatory Visit: Payer: Self-pay

## 2023-02-05 ENCOUNTER — Encounter: Payer: Self-pay | Admitting: Internal Medicine

## 2023-02-05 MED ORDER — REPATHA SURECLICK 140 MG/ML ~~LOC~~ SOAJ: SUBCUTANEOUS | 11 refills | Status: DC

## 2023-02-05 NOTE — Telephone Encounter (Signed)
Fax received and placed on providers desk.

## 2023-02-06 NOTE — Telephone Encounter (Signed)
Form faxed

## 2023-02-13 ENCOUNTER — Telehealth: Payer: Self-pay | Admitting: Internal Medicine

## 2023-02-13 ENCOUNTER — Other Ambulatory Visit: Payer: Self-pay | Admitting: Family Medicine

## 2023-02-13 NOTE — Telephone Encounter (Signed)
Form re-faxed

## 2023-02-13 NOTE — Telephone Encounter (Signed)
Agent from [employer] for financial assistance stating can they have the PA fax back over. Please advise   Fax- [phone] Phone-[phone]

## 2023-02-19 NOTE — Telephone Encounter (Signed)
Patient called back and is stating that [employer] still needs the Prior Authorization sent to them - patient states that on the form that the form has a box on the bottom that has to be marked and also needs dosage and amount to take.

## 2023-02-20 NOTE — Telephone Encounter (Signed)
Form faxed Again twice , dosage is already marked on form.

## 2023-02-23 NOTE — Telephone Encounter (Signed)
Patient states that [employer] says they do not have the prior authorization from the insurance company.

## 2023-02-24 ENCOUNTER — Other Ambulatory Visit (HOSPITAL_COMMUNITY): Payer: Self-pay

## 2023-02-24 ENCOUNTER — Telehealth: Payer: Self-pay

## 2023-02-24 NOTE — Telephone Encounter (Signed)
Message sent to PA team

## 2023-02-24 NOTE — Telephone Encounter (Signed)
Pharmacy Patient Advocate Encounter   Received notification from Pt Calls Messages that prior authorization for Repatha [employer] is required/requested.   Insurance verification completed.   The patient is insured through [facility] .   Per test claim:  Received a paid claim. Called [facility] and per the pharmacist a prior authorization was not needed it was a refill too soon and it can now be refilled. The copay is $145 due to the patient being in the donut hole.

## 2023-03-03 ENCOUNTER — Encounter: Payer: Self-pay | Admitting: Internal Medicine

## 2023-03-06 NOTE — Progress Notes (Unsigned)
[name] [address] [place] [address] Phone: [phone] Subjective:   Bruce Donath, am serving as a scribe for Dr. Antoine Primas.  I'm seeing this patient by the request  of:  Corwin Levins, MD  CC: back and neck pain follow up   EGB:[id number]  [name] is a 72 y.o. female coming in with complaint of back and neck pain. OMT 12/23/2022. Patient states that when her lower back stops hurting she will have neck pain. Pain on L side of cervical spine when neck is hurting. Pain in lower back pain has been radiating into lateral hips and piriformis.   Medications patient has been prescribed: Meloxicam  Taking:         Reviewed prior external information including notes and imaging from previsou exam, outside providers and external EMR if available.   As well as notes that were available from care everywhere and other healthcare systems.  Past medical history, social, surgical and family history all reviewed in electronic medical record.  No pertanent information unless stated regarding to the chief complaint.   Past Medical History:  Diagnosis Date   ANXIETY 12/01/2006   Arthritis    Blepharospasm 12/01/2006   BONE SPUR 12/01/2006   Cellulitis and abscess of oral soft tissues 05/01/2008   Cervicalgia 08/24/2008   COLD SORE 05/30/2008   DEPRESSION 12/01/2006   EXCISION OF GANGLION CYST, WRIST, HX OF 12/01/2006   GERD 12/01/2006   HYPERLIPIDEMIA 12/01/2006   HYPERTENSION, BORDERLINE 12/01/2006   LOW BACK PAIN 12/01/2006   MACULAR DEGENERATION 12/01/2006   OTITIS MEDIA, ACUTE, LEFT 05/30/2008   PARESTHESIA 08/24/2008   SINUSITIS- ACUTE-NOS 01/07/2010    Allergies  Allergen Reactions   Codeine    Hydrocodone-Acetaminophen    Loratadine-Pseudoephedrine Er    Statins Other (See Comments)    Joint and muscle cramp   Augmentin [Amoxicillin-Pot Clavulanate] Diarrhea and Nausea And Vomiting     Review of Systems:  No headache, visual changes,  nausea, vomiting, diarrhea, constipation, dizziness, abdominal pain, skin rash, fevers, chills, night sweats, weight loss, swollen lymph nodes, body aches, joint swelling, chest pain, shortness of breath, mood changes. POSITIVE muscle aches  Objective  Blood pressure 110/74, pulse 77, height 5\' 3"  (1.6 m), weight 123 lb (55.8 kg), SpO2 99%.   General: No apparent distress alert and oriented x3 mood and affect normal, dressed appropriately.  HEENT: Pupils equal, extraocular movements intact  Respiratory: Patient's speak in full sentences and does not appear short of breath  Cardiovascular: No lower extremity edema, non tender, no erythema  MSK:  Patient does have some back pain as well.  Neck pain as well.  Pain is out of proportion to the amount of palpation.  Osteopathic findings  C3 flexed rotated and side bent right T3 extended rotated and side bent right inhaled rib T7 extended rotated and side bent left L2 flexed rotated and side bent right Sacrum right on right    Assessment and Plan:  SI (sacroiliac) joint dysfunction Chronic problem with exacerbation again.  Attempted osteopathic manipulation but continues to have pain.  Do think that a potential epidural will be beneficial as well.  Discussed with patient Toradol and Depo-Medrol given injection as well.  Patient can follow-up with me in 6 weeks after the epidural otherwise    Nonallopathic problems  Decision today to treat with OMT was based on Physical Exam  After verbal consent patient was treated with HVLA, ME, FPR techniques  in cervical, rib, thoracic, lumbar, and sacral  areas  Patient tolerated the procedure well with improvement in symptoms  Patient given exercises, stretches and lifestyle modifications  See medications in patient instructions if given  Patient will follow up in 4-8 weeks     The above documentation has been reviewed and is accurate and complete [name], DO         Note: This  dictation was prepared with Dragon dictation along with smaller phrase technology. Any transcriptional errors that result from this process are unintentional.

## 2023-03-10 ENCOUNTER — Encounter: Payer: Self-pay | Admitting: Family Medicine

## 2023-03-10 ENCOUNTER — Ambulatory Visit: Payer: Medicare Other | Admitting: Family Medicine

## 2023-03-10 VITALS — BP 110/74 | HR 77 | Ht 63.0 in | Wt 123.0 lb

## 2023-03-10 DIAGNOSIS — M533 Sacrococcygeal disorders, not elsewhere classified: Secondary | ICD-10-CM | POA: Diagnosis not present

## 2023-03-10 DIAGNOSIS — M545 Low back pain, unspecified: Secondary | ICD-10-CM

## 2023-03-10 DIAGNOSIS — M9901 Segmental and somatic dysfunction of cervical region: Secondary | ICD-10-CM

## 2023-03-10 DIAGNOSIS — M9902 Segmental and somatic dysfunction of thoracic region: Secondary | ICD-10-CM | POA: Diagnosis not present

## 2023-03-10 DIAGNOSIS — M9903 Segmental and somatic dysfunction of lumbar region: Secondary | ICD-10-CM

## 2023-03-10 DIAGNOSIS — M9904 Segmental and somatic dysfunction of sacral region: Secondary | ICD-10-CM | POA: Diagnosis not present

## 2023-03-10 DIAGNOSIS — M9908 Segmental and somatic dysfunction of rib cage: Secondary | ICD-10-CM | POA: Diagnosis not present

## 2023-03-10 MED ORDER — KETOROLAC TROMETHAMINE 60 MG/2ML IM SOLN
60.0000 mg | Freq: Once | INTRAMUSCULAR | Status: AC
Start: 2023-03-10 — End: 2023-03-10
  Administered 2023-03-10: 60 mg via INTRAMUSCULAR

## 2023-03-10 MED ORDER — METHYLPREDNISOLONE ACETATE 80 MG/ML IJ SUSP
80.0000 mg | Freq: Once | INTRAMUSCULAR | Status: AC
Start: 2023-03-10 — End: 2023-03-10
  Administered 2023-03-10: 80 mg via INTRAMUSCULAR

## 2023-03-10 NOTE — Patient Instructions (Signed)
Injections today See me in 2-3 months

## 2023-03-10 NOTE — Telephone Encounter (Signed)
Patient is very upset regarding getting this medication. She would like a call back from Kim at [phone].

## 2023-03-10 NOTE — Assessment & Plan Note (Signed)
Chronic problem with exacerbation again.  Attempted osteopathic manipulation but continues to have pain.  Do think that a potential epidural will be beneficial as well.  Discussed with patient Toradol and Depo-Medrol given injection as well.  Patient can follow-up with me in 6 weeks after the epidural otherwise

## 2023-03-12 ENCOUNTER — Other Ambulatory Visit: Payer: Self-pay | Admitting: Family Medicine

## 2023-03-12 ENCOUNTER — Encounter: Payer: Self-pay | Admitting: Internal Medicine

## 2023-03-13 ENCOUNTER — Other Ambulatory Visit (INDEPENDENT_AMBULATORY_CARE_PROVIDER_SITE_OTHER): Payer: Medicare Other | Admitting: Pharmacist

## 2023-03-13 DIAGNOSIS — E785 Hyperlipidemia, unspecified: Secondary | ICD-10-CM

## 2023-03-13 NOTE — Progress Notes (Unsigned)
   03/13/2023 Name: [name] MRN: [id number] DOB: [birth year]    Pt referred to me due to inability to get Repatha due to increased cost in the donut hole.   Was able to get patient enrolled in [employer] for [employer].  [employer] ID: [id number] Reference number: [name]   START DATE 2023-02-11   END DATE 2024-02-10   GRANT AMOUNT $ 2500  Provided patient with information to give to pharmacy for copay assistance.  Arbutus Leas, PharmD, BCPS Clinical Pharmacist [facility] [phone]

## 2023-03-13 NOTE — Telephone Encounter (Signed)
Spoke with patient, she is very frustrated with the Rapatha process since entering the donut hole. She understands that her medication is at the pharmacy and it cose $145/month which is not something she can afford for one medication.  Informed patient I would work with Pharmacy team to figure out something to carry her over until the new year. She is statin intolerant, has tried 2 and failed previously, and is not willing to try that in the meantime.   Patient was interested in Soledad, but informed her that could take time and would not get her a quicker result. That could be considered if she wants to transition to that medication, and but will confer with PharmD team on best options at this time and get back with her via MyChart with an update.   Appreciative of call and no additional questions at this time.

## 2023-03-19 DIAGNOSIS — H353131 Nonexudative age-related macular degeneration, bilateral, early dry stage: Secondary | ICD-10-CM | POA: Diagnosis not present

## 2023-03-19 DIAGNOSIS — H2513 Age-related nuclear cataract, bilateral: Secondary | ICD-10-CM | POA: Diagnosis not present

## 2023-03-19 DIAGNOSIS — H25013 Cortical age-related cataract, bilateral: Secondary | ICD-10-CM | POA: Diagnosis not present

## 2023-03-24 ENCOUNTER — Other Ambulatory Visit: Payer: Self-pay | Admitting: Family Medicine

## 2023-04-19 ENCOUNTER — Other Ambulatory Visit: Payer: Self-pay | Admitting: Internal Medicine

## 2023-04-28 ENCOUNTER — Other Ambulatory Visit: Payer: Self-pay | Admitting: Family Medicine

## 2023-05-04 ENCOUNTER — Other Ambulatory Visit: Payer: Self-pay | Admitting: Internal Medicine

## 2023-05-04 ENCOUNTER — Encounter: Payer: Self-pay | Admitting: Family Medicine

## 2023-05-04 MED ORDER — PREDNISONE 10 MG PO TABS: ORAL_TABLET | ORAL | 0 refills | Status: DC

## 2023-05-11 NOTE — Progress Notes (Signed)
 [facility] [address] [place] [address] Phone: [phone] Subjective:   LILLETTE Berwyn Posey, am serving as a scribe for Dr. Arthea Claudene.  I'm seeing this patient by the request  of:  Norleen Lynwood ORN, MD  CC: Neck and back pain follow-up  YEP:[name]  [name] is a 73 y.o. female coming in with complaint of back and neck pain. OMT 03/10/2023. Patient states that her back has been better but her neck pain has increased. Today is the last day on prednisone  and her neck pain has improved. Patient has been very fatigued. Prednsione typically gives her energy but did not this time.   Medications patient has been prescribed: Flexeril , Prednisone   Taking: Feels like the prednisone  was significantly helpful in finished did just weeks ago.         Reviewed prior external information including notes and imaging from previsou exam, outside providers and external EMR if available.   As well as notes that were available from care everywhere and other healthcare systems.  Past medical history, social, surgical and family history all reviewed in electronic medical record.  No pertanent information unless stated regarding to the chief complaint.   Past Medical History:  Diagnosis Date   ANXIETY 12/01/2006   Arthritis    Blepharospasm 12/01/2006   BONE SPUR 12/01/2006   Cellulitis and abscess of oral soft tissues 05/01/2008   Cervicalgia 08/24/2008   COLD SORE 05/30/2008   DEPRESSION 12/01/2006   EXCISION OF GANGLION CYST, WRIST, HX OF 12/01/2006   GERD 12/01/2006   HYPERLIPIDEMIA 12/01/2006   HYPERTENSION, BORDERLINE 12/01/2006   LOW BACK PAIN 12/01/2006   MACULAR DEGENERATION 12/01/2006   OTITIS MEDIA, ACUTE, LEFT 05/30/2008   PARESTHESIA 08/24/2008   SINUSITIS- ACUTE-NOS 01/07/2010    Allergies  Allergen Reactions   Codeine    Hydrocodone -Acetaminophen     Loratadine-Pseudoephedrine Er    Statins Other (See Comments)    Joint and muscle cramp   Augmentin   [Amoxicillin -Pot Clavulanate] Diarrhea and Nausea And Vomiting     Review of Systems:  No headache, visual changes, nausea, vomiting, diarrhea, constipation, dizziness, abdominal pain, skin rash, fevers, chills, night sweats, weight loss, swollen lymph nodes, body aches, joint swelling, chest pain, shortness of breath, mood changes. POSITIVE muscle aches  Objective  Blood pressure 118/62, pulse 62, height 5' 3 (1.6 m), weight 126 lb (57.2 kg), SpO2 98%.   General: No apparent distress alert and oriented x3 mood and affect normal, dressed appropriately.  HEENT: Pupils equal, extraocular movements intact  Respiratory: Patient's speak in full sentences and does not appear short of breath  Cardiovascular: No lower extremity edema, non tender, no erythema  Gait MSK:  Back does have some tightness noted.  Some tenderness to palpation in the paraspinal musculature in the neck noted.  Pain is out of proportion to the amount of palpation.  Osteopathic findings  C2 flexed rotated and side bent right C5 flexed rotated and side bent right C6 flexed rotated and side bent left T5 extended rotated and side bent right inhaled rib T9 extended rotated and side bent left L2 flexed rotated and side bent right Sacrum right on right       Assessment and Plan:  SI (sacroiliac) joint dysfunction Has had chronic exacerbations of this from time to time and did need the prednisone . Was having some other difficulties as well that likely was contributing to some of the discomfort and pain as well.  Discussed icing regimen  and home exercises.  Discussed which activities to do and which ones to avoid.  Increase activity slowly.    Nonallopathic problems  Decision today to treat with OMT was based on Physical Exam  After verbal consent patient was treated with HVLA, ME, FPR techniques in cervical, rib, thoracic, lumbar, and sacral  areas  Patient tolerated the procedure well with improvement in  symptoms  Patient given exercises, stretches and lifestyle modifications  See medications in patient instructions if given  Patient will follow up in 4-8 weeks     The above documentation has been reviewed and is accurate and complete Gonzalo Waymire M Dorina Ribaudo, DO         Note: This dictation was prepared with Dragon dictation along with smaller phrase technology. Any transcriptional errors that result from this process are unintentional.

## 2023-05-12 ENCOUNTER — Ambulatory Visit: Payer: Medicare Other | Admitting: Family Medicine

## 2023-05-12 ENCOUNTER — Encounter: Payer: Self-pay | Admitting: Family Medicine

## 2023-05-12 VITALS — BP 118/62 | HR 62 | Ht 63.0 in | Wt 126.0 lb

## 2023-05-12 DIAGNOSIS — M9903 Segmental and somatic dysfunction of lumbar region: Secondary | ICD-10-CM | POA: Diagnosis not present

## 2023-05-12 DIAGNOSIS — M533 Sacrococcygeal disorders, not elsewhere classified: Secondary | ICD-10-CM | POA: Diagnosis not present

## 2023-05-12 DIAGNOSIS — M9902 Segmental and somatic dysfunction of thoracic region: Secondary | ICD-10-CM

## 2023-05-12 DIAGNOSIS — M9901 Segmental and somatic dysfunction of cervical region: Secondary | ICD-10-CM

## 2023-05-12 DIAGNOSIS — M9904 Segmental and somatic dysfunction of sacral region: Secondary | ICD-10-CM

## 2023-05-12 DIAGNOSIS — M9908 Segmental and somatic dysfunction of rib cage: Secondary | ICD-10-CM

## 2023-05-12 NOTE — Patient Instructions (Signed)
Good to see you See me again in 3 months 

## 2023-05-12 NOTE — Assessment & Plan Note (Signed)
 Has had chronic exacerbations of this from time to time and did need the prednisone . Was having some other difficulties as well that likely was contributing to some of the discomfort and pain as well.  Discussed icing regimen and home exercises.  Discussed which activities to do and which ones to avoid.  Increase activity slowly.

## 2023-05-24 ENCOUNTER — Telehealth: Payer: Medicare Other | Admitting: Family Medicine

## 2023-05-24 DIAGNOSIS — J019 Acute sinusitis, unspecified: Secondary | ICD-10-CM

## 2023-05-24 DIAGNOSIS — B9689 Other specified bacterial agents as the cause of diseases classified elsewhere: Secondary | ICD-10-CM | POA: Diagnosis not present

## 2023-05-24 MED ORDER — DOXYCYCLINE HYCLATE 100 MG PO TABS: 100.0000 mg | ORAL_TABLET | Freq: Two times a day (BID) | ORAL | 0 refills | Status: AC

## 2023-05-24 NOTE — Patient Instructions (Signed)

## 2023-05-24 NOTE — Progress Notes (Signed)
Virtual Visit Consent   [name], you are scheduled for a virtual visit with a [facility] provider today. Just as with appointments in the office, your consent must be obtained to participate. Your consent will be active for this visit and any virtual visit you may have with one of our providers in the next 365 days. If you have a MyChart account, a copy of this consent can be sent to you electronically.  As this is a virtual visit, video technology does not allow for your provider to perform a traditional examination. This may limit your provider's ability to fully assess your condition. If your provider identifies any concerns that need to be evaluated in person or the need to arrange testing (such as labs, EKG, etc.), we will make arrangements to do so. Although advances in technology are sophisticated, we cannot ensure that it will always work on either your end or our end. If the connection with a video visit is poor, the visit may have to be switched to a telephone visit. With either a video or telephone visit, we are not always able to ensure that we have a secure connection.  By engaging in this virtual visit, you consent to the provision of healthcare and authorize for your insurance to be billed (if applicable) for the services provided during this visit. Depending on your insurance coverage, you may receive a charge related to this service.  I need to obtain your verbal consent now. Are you willing to proceed with your visit today? [name] has provided verbal consent on 05/24/2023 for a virtual visit (video or telephone). Georgana Curio, FNP  Date: 05/24/2023 2:42 PM  Virtual Visit via Video Note   I, Georgana Curio, connected with  [name]  ([id number], [birth year]) on 05/24/23 at  3:00 PM EST by a video-enabled telemedicine application and verified that I am speaking with the correct person using two identifiers.  Location: Patient: Virtual Visit Location Patient:  Home Provider: Virtual Visit Location Provider: Home Office   I discussed the limitations of evaluation and management by telemedicine and the availability of in person appointments. The patient expressed understanding and agreed to proceed.    History of Present Illness: [name] is a 73 y.o. who identifies as a female who was assigned female at birth, and is being seen today for rt sided sinus pressure and pain with drainage. Did not resolve with prednisone. No fever. Rt ear pain and headache. Lymphadenopathy noted. Sx for several weekends. Marland Kitchen  HPI: HPI  Problems:  Patient Active Problem List   Diagnosis Date Noted   Asthma exacerbation 02/04/2023   Allergic rhinitis 02/02/2023   Anxiety and depression 03/06/2022   SI (sacroiliac) joint dysfunction 09/24/2021   Somatic dysfunction of spine, sacral 09/24/2021   Impacted cerumen, left ear 06/09/2021   Bradycardia 05/07/2020   Heart murmur 05/07/2020   CKD (chronic kidney disease) stage 3, GFR 30-59 ml/min (HCC) 05/07/2020   B12 deficiency 05/07/2020   Vitamin D deficiency 05/07/2020   Exposure to COVID-19 virus 11/24/2018   Smoker 11/24/2018   Occipital pain 10/23/2017   Arthritis of carpometacarpal (CMC) joints of both thumbs 08/19/2017   Left ear hearing loss 05/20/2017   Diarrhea 04/09/2017   Vertigo 04/09/2017   Arthritis of carpometacarpal [facility]) joint of right thumb 01/29/2017   Right hand pain 01/15/2017   Neck pain on right side 01/15/2017   Cellulitis of right thigh 12/17/2016   Right arm pain 08/07/2016  Hypotension 04/08/2016   Chronic constipation 04/08/2016   Pain in joint of right shoulder 03/15/2016   Allergic conjunctivitis 01/26/2015   Wheezing 07/27/2014   Wellness examination 02/14/2014   Cough 02/14/2014   Salivary gland calculus 07/20/2011   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   Tick bite, infected 02/26/2011   Encounter for well adult exam with abnormal findings 11/21/2010   Cervicalgia 08/24/2008    PARESTHESIA 08/24/2008   Hyperlipidemia 12/01/2006   Blepharospasm 12/01/2006   Macular degeneration (senile) of retina 12/01/2006   Essential hypertension 12/01/2006   GERD 12/01/2006   LOW BACK PAIN 12/01/2006   Bony exostosis 12/01/2006   EXCISION OF GANGLION CYST, WRIST, HX OF 12/01/2006    Allergies:  Allergies  Allergen Reactions   Codeine    Hydrocodone-Acetaminophen    Loratadine-Pseudoephedrine Er    Statins Other (See Comments)    Joint and muscle cramp   Augmentin [Amoxicillin-Pot Clavulanate] Diarrhea and Nausea And Vomiting   Medications:  Current Outpatient Medications:    amLODipine (NORVASC) 5 MG tablet, Take 1 tablet (5 mg total) by mouth daily., Disp: 90 tablet, Rfl: 3   aspirin 81 MG tablet, Take 81 mg by mouth daily., Disp: , Rfl:    clonazePAM (KLONOPIN) 1 MG tablet, TAKE 1/2 TO 1 TABLET BY MOUTH TWICE DAILY AS NEEDED, Disp: 60 tablet, Rfl: 5   cyclobenzaprine (FLEXERIL) 5 MG tablet, TAKE 1 TABLET(5 MG) BY MOUTH AT BEDTIME AS NEEDED FOR MUSCLE SPASMS, Disp: 30 tablet, Rfl: 0   Evolocumab (REPATHA SURECLICK) 140 MG/ML SOAJ, Take 1 injection every 2 weeks, Disp: 2 mL, Rfl: 11   famotidine (PEPCID) 20 MG tablet, Take 1 tablet (20 mg total) by mouth 2 (two) times daily., Disp: 180 tablet, Rfl: 1   FLUoxetine (PROZAC) 40 MG capsule, Take 40 mg by mouth daily., Disp: , Rfl:    hydrochlorothiazide (MICROZIDE) 12.5 MG capsule, Take 1 capsule (12.5 mg total) by mouth daily., Disp: 90 capsule, Rfl: 3   losartan (COZAAR) 100 MG tablet, Take 1 tablet (100 mg total) by mouth daily., Disp: 90 tablet, Rfl: 3   meloxicam (MOBIC) 7.5 MG tablet, TAKE 1 TABLET(7.5 MG) BY MOUTH DAILY, Disp: 30 tablet, Rfl: 0   predniSONE (DELTASONE) 10 MG tablet, 3 tabs by mouth per day for 3 days,2tabs per day for 3 days,1tab per day for 3 days, Disp: 18 tablet, Rfl: 0   tiZANidine (ZANAFLEX) 2 MG tablet, Take 2 mg by mouth at bedtime., Disp: , Rfl:    valACYclovir (VALTREX) 1000 MG tablet, Take 1  tablet (1,000 mg total) by mouth 2 (two) times daily as needed., Disp: 60 tablet, Rfl: 0  Observations/Objective: Patient is well-developed, well-nourished in no acute distress.  Resting comfortably  at home.  Head is normocephalic, atraumatic.  No labored breathing.  Speech is clear and coherent with logical content.  Patient is alert and oriented at baseline.    Assessment and Plan: 1. Acute bacterial sinusitis (Primary)  Increase fluids, humidifier at night, tylenol or ibuprofen as directed, UC if sx worsen.   Follow Up Instructions: I discussed the assessment and treatment plan with the patient. The patient was provided an opportunity to ask questions and all were answered. The patient agreed with the plan and demonstrated an understanding of the instructions.  A copy of instructions were sent to the patient via MyChart unless otherwise noted below.     The patient was advised to call back or seek an in-person evaluation if the symptoms worsen or if the condition  fails to improve as anticipated.    Georgana Curio, FNP

## 2023-05-31 ENCOUNTER — Other Ambulatory Visit: Payer: Self-pay | Admitting: Family Medicine

## 2023-06-30 ENCOUNTER — Other Ambulatory Visit: Payer: Self-pay | Admitting: Family Medicine

## 2023-07-09 ENCOUNTER — Other Ambulatory Visit: Payer: Self-pay | Admitting: Internal Medicine

## 2023-07-09 ENCOUNTER — Other Ambulatory Visit: Payer: Self-pay

## 2023-07-22 ENCOUNTER — Encounter: Payer: Self-pay | Admitting: Internal Medicine

## 2023-07-22 ENCOUNTER — Telehealth (INDEPENDENT_AMBULATORY_CARE_PROVIDER_SITE_OTHER): Admitting: Internal Medicine

## 2023-07-22 DIAGNOSIS — J309 Allergic rhinitis, unspecified: Secondary | ICD-10-CM

## 2023-07-22 DIAGNOSIS — J019 Acute sinusitis, unspecified: Secondary | ICD-10-CM | POA: Insufficient documentation

## 2023-07-22 DIAGNOSIS — E559 Vitamin D deficiency, unspecified: Secondary | ICD-10-CM

## 2023-07-22 MED ORDER — PREDNISONE 10 MG PO TABS: ORAL_TABLET | ORAL | 0 refills | Status: DC

## 2023-07-22 MED ORDER — AZITHROMYCIN 250 MG PO TABS: ORAL_TABLET | ORAL | 1 refills | Status: AC

## 2023-07-22 NOTE — Assessment & Plan Note (Signed)
Mild to mod, for antibx course zpack,  to f/u any worsening symptoms or concerns 

## 2023-07-22 NOTE — Assessment & Plan Note (Signed)
 Mild to mod, for prednisone taper, to f/u any worsening symptoms or concerns

## 2023-07-22 NOTE — Progress Notes (Signed)
 Patient ID: [name], female   DOB: Aug 30, 1950, 73 y.o.   MRN: [id number]  Virtual Visit via Video Note  I connected with [name] on 07/22/23 at 10:40 AM EDT by a video enabled telemedicine application and verified that I am speaking with the correct person using two identifiers.  Location of all participants today Patient: at home Provider: at office   I discussed the limitations of evaluation and management by telemedicine and the availability of in person appointments. The patient expressed understanding and agreed to proceed.  History of Present Illness:  Here with 2-3 days acute onset fever, facial pain, pressure behind the right ear, headache, general weakness and malaise, and greenish d/c, with mild ST and cough, but pt denies chest pain, wheezing, increased sob or doe, orthopnea, PND, increased LE swelling, palpitations, dizziness or syncope.  Does have several wks ongoing nasal allergy symptoms with clearish congestion, itch and sneezing, without fever, pain, ST, cough, swelling or wheezing.  Zyrtec not working well.  Also tried mucinex.   Past Medical History:  Diagnosis Date   ANXIETY 12/01/2006   Arthritis    Blepharospasm 12/01/2006   BONE SPUR 12/01/2006   Cellulitis and abscess of oral soft tissues 05/01/2008   Cervicalgia 08/24/2008   COLD SORE 05/30/2008   DEPRESSION 12/01/2006   EXCISION OF GANGLION CYST, WRIST, HX OF 12/01/2006   GERD 12/01/2006   HYPERLIPIDEMIA 12/01/2006   HYPERTENSION, BORDERLINE 12/01/2006   LOW BACK PAIN 12/01/2006   MACULAR DEGENERATION 12/01/2006   OTITIS MEDIA, ACUTE, LEFT 05/30/2008   PARESTHESIA 08/24/2008   SINUSITIS- ACUTE-NOS 01/07/2010   Past Surgical History:  Procedure Laterality Date   ABDOMINAL HYSTERECTOMY     blepharospasm     bone spur     removal right foot   BREAST LUMPECTOMY     right   GANGLION CYST EXCISION     H/O Macular degeneration      reports that she has been smoking. She has never used smokeless tobacco. She reports that  she does not drink alcohol and does not use drugs. family history includes Diabetes in her daughter; Heart attack in her father; Heart disease in her father; Hypertension in her mother; Pulmonary fibrosis in her mother. Allergies  Allergen Reactions   Codeine    Hydrocodone-Acetaminophen    Loratadine-Pseudoephedrine Er    Statins Other (See Comments)    Joint and muscle cramp   Augmentin [Amoxicillin-Pot Clavulanate] Diarrhea and Nausea And Vomiting   Current Outpatient Medications on File Prior to Visit  Medication Sig Dispense Refill   amLODipine (NORVASC) 5 MG tablet TAKE 1 TABLET(5 MG) BY MOUTH DAILY 90 tablet 3   aspirin 81 MG tablet Take 81 mg by mouth daily.     clonazePAM (KLONOPIN) 1 MG tablet TAKE 1/2 TO 1 TABLET BY MOUTH TWICE DAILY AS NEEDED 60 tablet 5   cyclobenzaprine (FLEXERIL) 5 MG tablet TAKE 1 TABLET(5 MG) BY MOUTH AT BEDTIME AS NEEDED FOR MUSCLE SPASMS 30 tablet 0   Evolocumab (REPATHA SURECLICK) 140 MG/ML SOAJ Take 1 injection every 2 weeks 2 mL 11   famotidine (PEPCID) 20 MG tablet Take 1 tablet (20 mg total) by mouth 2 (two) times daily. 180 tablet 1   FLUoxetine (PROZAC) 40 MG capsule Take 40 mg by mouth daily.     hydrochlorothiazide (MICROZIDE) 12.5 MG capsule TAKE 1 CAPSULE(12.5 MG) BY MOUTH DAILY 90 capsule 3   losartan (COZAAR) 100 MG tablet Take 1 tablet (100 mg total) by mouth  daily. 90 tablet 3   meloxicam (MOBIC) 7.5 MG tablet TAKE 1 TABLET(7.5 MG) BY MOUTH DAILY 30 tablet 0   tiZANidine (ZANAFLEX) 2 MG tablet Take 2 mg by mouth at bedtime.     valACYclovir (VALTREX) 1000 MG tablet Take 1 tablet (1,000 mg total) by mouth 2 (two) times daily as needed. 60 tablet 0   No current facility-administered medications on file prior to visit.    Observations/Objective: Alert, mild ill appropriate mood and affect, resps normal, cn 2-12 intact, moves all 4s, no visible rash or swelling Lab Results  Component Value Date   WBC 11.5 (H) 02/02/2023   HGB 13.3  02/02/2023   HCT 40.5 02/02/2023   PLT 282.0 02/02/2023   GLUCOSE 92 02/02/2023   CHOL 186 02/02/2023   TRIG 209.0 (H) 02/02/2023   HDL 61.70 02/02/2023   LDLDIRECT 106.0 06/06/2021   LDLCALC 83 02/02/2023   ALT 7 02/02/2023   AST 15 02/02/2023   NA 138 02/02/2023   K 3.9 02/02/2023   CL 103 02/02/2023   CREATININE 1.23 (H) 02/02/2023   BUN 16 02/02/2023   CO2 28 02/02/2023   TSH 2.55 08/01/2022   HGBA1C 5.7 02/02/2023   Assessment and Plan: See notes  Follow Up Instructions: See notes   I discussed the assessment and treatment plan with the patient. The patient was provided an opportunity to ask questions and all were answered. The patient agreed with the plan and demonstrated an understanding of the instructions.   The patient was advised to call back or seek an in-person evaluation if the symptoms worsen or if the condition fails to improve as anticipated.  Oliver Barre, MD

## 2023-07-22 NOTE — Assessment & Plan Note (Signed)
Last vitamin D Lab Results  Component Value Date   VD25OH 34.74 08/01/2022   Low, to start oral replacement

## 2023-07-22 NOTE — Patient Instructions (Signed)
 Please take all new medication as prescribed

## 2023-07-31 ENCOUNTER — Other Ambulatory Visit: Payer: Self-pay | Admitting: Family Medicine

## 2023-07-31 NOTE — Progress Notes (Signed)
 [name] [address] [place] [address] Phone: [phone] Subjective:   [name], am serving as a scribe for Dr. Antoine Primas.  I'm seeing this patient by the request  of:  Kim Levins, MD  CC: Back and neck pain follow-up  [id number]  BIJAL Padilla is a 73 y.o. female coming in with complaint of back and neck pain. OMT 05/12/2023. Patient states that her pain increased so she went into chiropractor. Has to use meloxicam daily. Wants to discuss gabapentin. Injec  Medications patient has been prescribed: Meloxicam, Flexeril  Taking:         Reviewed prior external information including notes and imaging from previsou exam, outside providers and external EMR if available.   As well as notes that were available from care everywhere and other healthcare systems.  Past medical history, social, surgical and family history all reviewed in electronic medical record.  No pertanent information unless stated regarding to the chief complaint.   Past Medical History:  Diagnosis Date   ANXIETY 12/01/2006   Arthritis    Blepharospasm 12/01/2006   BONE SPUR 12/01/2006   Cellulitis and abscess of oral soft tissues 05/01/2008   Cervicalgia 08/24/2008   COLD SORE 05/30/2008   DEPRESSION 12/01/2006   EXCISION OF GANGLION CYST, WRIST, HX OF 12/01/2006   GERD 12/01/2006   HYPERLIPIDEMIA 12/01/2006   HYPERTENSION, BORDERLINE 12/01/2006   LOW BACK PAIN 12/01/2006   MACULAR DEGENERATION 12/01/2006   OTITIS MEDIA, ACUTE, LEFT 05/30/2008   PARESTHESIA 08/24/2008   SINUSITIS- ACUTE-NOS 01/07/2010    Allergies  Allergen Reactions   Codeine    Hydrocodone-Acetaminophen    Loratadine-Pseudoephedrine Er    Statins Other (See Comments)    Joint and muscle cramp   Augmentin [Amoxicillin-Pot Clavulanate] Diarrhea and Nausea And Vomiting     Review of Systems:  No headache, visual changes, nausea, vomiting, diarrhea, constipation, dizziness, abdominal pain, skin  rash, fevers, chills, night sweats, weight loss, swollen lymph nodes, body aches, joint swelling, chest pain, shortness of breath, mood changes. POSITIVE muscle aches  Objective  Blood pressure (!) 110/52, height 5\' 3"  (1.6 m), weight 122 lb (55.3 kg).   General: No apparent distress alert and oriented x3 mood and affect normal, dressed appropriately.  HEENT: Pupils equal, extraocular movements intact  Respiratory: Patient's speak in full sentences and does not appear short of breath  Cardiovascular: No lower extremity edema, non tender, no erythema  Gait MSK:  Back does have some loss lordosis noted.  Some tenderness to palpation in the paraspinal musculature.  Some tightness with [name] right greater than left.  Negative straight leg test.  Some limited range of motion of the neck noted.  Osteopathic findings  C2 flexed rotated and side bent right C6 flexed rotated and side bent left T3 extended rotated and side bent right inhaled rib T9 extended rotated and side bent left L2 flexed rotated and side bent right Sacrum right on right       Assessment and Plan:  SI (sacroiliac) joint dysfunction Multiple joint complaint.  Has continued to have difficulty.  We discussed different medications again including the meloxicam and not using it on a regular basis secondary to her kidney function.  We did discuss the Flexeril for breakthrough pain.  Discussed icing regimen and home exercises, discussed which activities to do and which ones to avoid.  Discussed which activities to do and which ones to avoid.  Follow-up again in 6 to  8 weeks otherwise.   Toradol and Depo-Medrol injection given today. Nonallopathic problems  Decision today to treat with OMT was based on Physical Exam  After verbal consent patient was treated with HVLA, ME, FPR techniques in cervical, rib, thoracic, lumbar, and sacral  areas  Patient tolerated the procedure well with improvement in symptoms  Patient given  exercises, stretches and lifestyle modifications  See medications in patient instructions if given  Patient will follow up in 4-8 weeks     The above documentation has been reviewed and is accurate and complete Kim Saa, DO         Note: This dictation was prepared with Dragon dictation along with smaller phrase technology. Any transcriptional errors that result from this process are unintentional.

## 2023-08-03 ENCOUNTER — Encounter: Payer: Self-pay | Admitting: Family Medicine

## 2023-08-03 ENCOUNTER — Ambulatory Visit (INDEPENDENT_AMBULATORY_CARE_PROVIDER_SITE_OTHER): Payer: Medicare Other | Admitting: Internal Medicine

## 2023-08-03 ENCOUNTER — Encounter: Payer: Self-pay | Admitting: Internal Medicine

## 2023-08-03 ENCOUNTER — Ambulatory Visit: Payer: Medicare Other | Admitting: Family Medicine

## 2023-08-03 VITALS — BP 110/52 | Ht 63.0 in | Wt 122.0 lb

## 2023-08-03 VITALS — BP 122/74 | HR 62 | Temp 97.7°F | Ht 63.0 in | Wt 122.0 lb

## 2023-08-03 DIAGNOSIS — M9908 Segmental and somatic dysfunction of rib cage: Secondary | ICD-10-CM

## 2023-08-03 DIAGNOSIS — Z0001 Encounter for general adult medical examination with abnormal findings: Secondary | ICD-10-CM

## 2023-08-03 DIAGNOSIS — M9902 Segmental and somatic dysfunction of thoracic region: Secondary | ICD-10-CM | POA: Diagnosis not present

## 2023-08-03 DIAGNOSIS — R739 Hyperglycemia, unspecified: Secondary | ICD-10-CM

## 2023-08-03 DIAGNOSIS — Z0000 Encounter for general adult medical examination without abnormal findings: Secondary | ICD-10-CM

## 2023-08-03 DIAGNOSIS — E782 Mixed hyperlipidemia: Secondary | ICD-10-CM | POA: Diagnosis not present

## 2023-08-03 DIAGNOSIS — N1831 Chronic kidney disease, stage 3a: Secondary | ICD-10-CM | POA: Diagnosis not present

## 2023-08-03 DIAGNOSIS — M9904 Segmental and somatic dysfunction of sacral region: Secondary | ICD-10-CM

## 2023-08-03 DIAGNOSIS — E538 Deficiency of other specified B group vitamins: Secondary | ICD-10-CM

## 2023-08-03 DIAGNOSIS — F17200 Nicotine dependence, unspecified, uncomplicated: Secondary | ICD-10-CM | POA: Diagnosis not present

## 2023-08-03 DIAGNOSIS — M9903 Segmental and somatic dysfunction of lumbar region: Secondary | ICD-10-CM | POA: Diagnosis not present

## 2023-08-03 DIAGNOSIS — E559 Vitamin D deficiency, unspecified: Secondary | ICD-10-CM | POA: Diagnosis not present

## 2023-08-03 DIAGNOSIS — M9901 Segmental and somatic dysfunction of cervical region: Secondary | ICD-10-CM

## 2023-08-03 DIAGNOSIS — I10 Essential (primary) hypertension: Secondary | ICD-10-CM

## 2023-08-03 DIAGNOSIS — M2550 Pain in unspecified joint: Secondary | ICD-10-CM

## 2023-08-03 DIAGNOSIS — M533 Sacrococcygeal disorders, not elsewhere classified: Secondary | ICD-10-CM | POA: Diagnosis not present

## 2023-08-03 LAB — CBC WITH DIFFERENTIAL/PLATELET
Basophils Absolute: 0.1 10*3/uL (ref 0.0–0.1)
Basophils Relative: 0.8 % (ref 0.0–3.0)
Eosinophils Absolute: 0.2 10*3/uL (ref 0.0–0.7)
Eosinophils Relative: 1.4 % (ref 0.0–5.0)
HCT: 39.7 % (ref 36.0–46.0)
Hemoglobin: 13.3 g/dL (ref 12.0–15.0)
Lymphocytes Relative: 21.1 % (ref 12.0–46.0)
Lymphs Abs: 3.3 10*3/uL (ref 0.7–4.0)
MCHC: 33.6 g/dL (ref 30.0–36.0)
MCV: 91 fl (ref 78.0–100.0)
Monocytes Absolute: 1 10*3/uL (ref 0.1–1.0)
Monocytes Relative: 6.4 % (ref 3.0–12.0)
Neutro Abs: 11 10*3/uL — ABNORMAL HIGH (ref 1.4–7.7)
Neutrophils Relative %: 70.3 % (ref 43.0–77.0)
Platelets: 334 10*3/uL (ref 150.0–400.0)
RBC: 4.37 Mil/uL (ref 3.87–5.11)
RDW: 13.4 % (ref 11.5–15.5)
WBC: 15.7 10*3/uL — ABNORMAL HIGH (ref 4.0–10.5)

## 2023-08-03 LAB — BASIC METABOLIC PANEL WITH GFR
BUN: 23 mg/dL (ref 6–23)
CO2: 28 meq/L (ref 19–32)
Calcium: 9.7 mg/dL (ref 8.4–10.5)
Chloride: 100 meq/L (ref 96–112)
Creatinine, Ser: 1.5 mg/dL — ABNORMAL HIGH (ref 0.40–1.20)
GFR: 34.99 mL/min — ABNORMAL LOW (ref >60.00)
Glucose, Bld: 78 mg/dL (ref 70–99)
Potassium: 4.9 meq/L (ref 3.5–5.1)
Sodium: 137 meq/L (ref 135–145)

## 2023-08-03 LAB — VITAMIN D 25 HYDROXY (VIT D DEFICIENCY, FRACTURES): VITD: 42.09 ng/mL (ref 30.00–100.00)

## 2023-08-03 LAB — MICROALBUMIN / CREATININE URINE RATIO
Creatinine,U: 235.2 mg/dL
Microalb Creat Ratio: 6.4 mg/g (ref 0.0–30.0)
Microalb, Ur: 1.5 mg/dL (ref 0.0–1.9)

## 2023-08-03 LAB — HEPATIC FUNCTION PANEL
ALT: 9 U/L (ref 0–35)
AST: 13 U/L (ref 0–37)
Albumin: 4.4 g/dL (ref 3.5–5.2)
Alkaline Phosphatase: 64 U/L (ref 39–117)
Bilirubin, Direct: 0 mg/dL (ref 0.0–0.3)
Total Bilirubin: 0.4 mg/dL (ref 0.2–1.2)
Total Protein: 7.2 g/dL (ref 6.0–8.3)

## 2023-08-03 LAB — LIPID PANEL
Cholesterol: 191 mg/dL (ref 0–200)
HDL: 58.5 mg/dL (ref >39.00)
LDL Cholesterol: 77 mg/dL (ref 0–99)
NonHDL: 132.9
Total CHOL/HDL Ratio: 3
Triglycerides: 281 mg/dL — ABNORMAL HIGH (ref 0.0–149.0)
VLDL: 56.2 mg/dL — ABNORMAL HIGH (ref 0.0–40.0)

## 2023-08-03 LAB — HEMOGLOBIN A1C: Hgb A1c MFr Bld: 5.8 % (ref 4.6–6.5)

## 2023-08-03 LAB — VITAMIN B12: Vitamin B-12: 460 pg/mL (ref 211–911)

## 2023-08-03 LAB — TSH: TSH: 2.94 u[IU]/mL (ref 0.35–5.50)

## 2023-08-03 MED ORDER — GABAPENTIN 100 MG PO CAPS: 200.0000 mg | ORAL_CAPSULE | Freq: Two times a day (BID) | ORAL | 0 refills | Status: DC

## 2023-08-03 MED ORDER — KETOROLAC TROMETHAMINE 60 MG/2ML IM SOLN
60.0000 mg | Freq: Once | INTRAMUSCULAR | Status: AC
  Administered 2023-08-03: 60 mg via INTRAMUSCULAR

## 2023-08-03 MED ORDER — METHYLPREDNISOLONE ACETATE 80 MG/ML IJ SUSP
80.0000 mg | Freq: Once | INTRAMUSCULAR | Status: AC
  Administered 2023-08-03: 80 mg via INTRAMUSCULAR

## 2023-08-03 NOTE — Progress Notes (Unsigned)
 Patient ID: [name], female   DOB: 09-20-1950, 73 y.o.   MRN: [id number]         Chief Complaint:: wellness exam and smoker, ckd3a, hld, htn, low B12 and Vit d       HPI:  [name] is a 73 y.o. female here for wellness exam; still smoking, not ready to quit but working on it; declines dxa or mammogram; o/w up to date                        Also now to start gabapentin low dose for persistent lbp 200 mg at bedtime; is now taking repatha after was approved.  Pt denies chest pain, increased sob or doe, wheezing, orthopnea, PND, increased LE swelling, palpitations, dizziness or syncope.   Pt denies polydipsia, polyuria, or new focal neuro s/s.    Pt denies fever, wt loss, night sweats, loss of appetite, or other constitutional symptoms      Wt Readings from Last 3 Encounters:  08/03/23 122 lb (55.3 kg)  08/03/23 122 lb (55.3 kg)  05/12/23 126 lb (57.2 kg)   BP Readings from Last 3 Encounters:  08/03/23 122/74  08/03/23 (!) 110/52  05/12/23 118/62   Immunization History  Administered Date(s) Administered   DTaP 04/04/2022   Fluad Quad(high Dose 65+) 05/07/2020, 06/06/2021   Fluad Trivalent(High Dose 65+) 02/02/2023   Influenza Nasal 01/19/2016   Influenza-Unspecified 01/26/2014, 02/02/2015, 03/08/2018   PNEUMOCOCCAL CONJUGATE-20 06/06/2021   Td 08/23/2009   Zoster Recombinant(Shingrix) 07/29/2021   Zoster, Unspecified 04/11/2022, 04/11/2022   Health Maintenance Due  Topic Date Due   DEXA SCAN  Never done   MAMMOGRAM  02/03/2019      Past Medical History:  Diagnosis Date   ANXIETY 12/01/2006   Arthritis    Blepharospasm 12/01/2006   BONE SPUR 12/01/2006   Cellulitis and abscess of oral soft tissues 05/01/2008   Cervicalgia 08/24/2008   COLD SORE 05/30/2008   DEPRESSION 12/01/2006   EXCISION OF GANGLION CYST, WRIST, HX OF 12/01/2006   GERD 12/01/2006   HYPERLIPIDEMIA 12/01/2006   HYPERTENSION, BORDERLINE 12/01/2006   LOW BACK PAIN 12/01/2006   MACULAR DEGENERATION 12/01/2006   OTITIS  MEDIA, ACUTE, LEFT 05/30/2008   PARESTHESIA 08/24/2008   SINUSITIS- ACUTE-NOS 01/07/2010   Past Surgical History:  Procedure Laterality Date   ABDOMINAL HYSTERECTOMY     blepharospasm     bone spur     removal right foot   BREAST LUMPECTOMY     right   GANGLION CYST EXCISION     H/O Macular degeneration      reports that she has been smoking. She has never used smokeless tobacco. She reports that she does not drink alcohol and does not use drugs. family history includes Diabetes in her daughter; Heart attack in her father; Heart disease in her father; Hypertension in her mother; Pulmonary fibrosis in her mother. Allergies  Allergen Reactions   Codeine    Hydrocodone-Acetaminophen    Loratadine-Pseudoephedrine Er    Statins Other (See Comments)    Joint and muscle cramp   Augmentin [Amoxicillin-Pot Clavulanate] Diarrhea and Nausea And Vomiting   Current Outpatient Medications on File Prior to Visit  Medication Sig Dispense Refill   amLODipine (NORVASC) 5 MG tablet TAKE 1 TABLET(5 MG) BY MOUTH DAILY 90 tablet 3   aspirin 81 MG tablet Take 81 mg by mouth daily.     clonazePAM (KLONOPIN) 1 MG tablet TAKE 1/2 TO 1  TABLET BY MOUTH TWICE DAILY AS NEEDED 60 tablet 5   cyclobenzaprine (FLEXERIL) 5 MG tablet TAKE 1 TABLET(5 MG) BY MOUTH AT BEDTIME AS NEEDED FOR MUSCLE SPASMS 30 tablet 0   Evolocumab (REPATHA SURECLICK) 140 MG/ML SOAJ Take 1 injection every 2 weeks 2 mL 11   famotidine (PEPCID) 20 MG tablet Take 1 tablet (20 mg total) by mouth 2 (two) times daily. 180 tablet 1   FLUoxetine (PROZAC) 40 MG capsule Take 40 mg by mouth daily.     hydrochlorothiazide (MICROZIDE) 12.5 MG capsule TAKE 1 CAPSULE(12.5 MG) BY MOUTH DAILY 90 capsule 3   losartan (COZAAR) 100 MG tablet Take 1 tablet (100 mg total) by mouth daily. 90 tablet 3   meloxicam (MOBIC) 7.5 MG tablet TAKE 1 TABLET(7.5 MG) BY MOUTH DAILY 30 tablet 0   predniSONE (DELTASONE) 10 MG tablet 3 tabs by mouth per day for 3 days,2tabs per  day for 3 days,1tab per day for 3 days 18 tablet 0   tiZANidine (ZANAFLEX) 2 MG tablet Take 2 mg by mouth at bedtime.     valACYclovir (VALTREX) 1000 MG tablet Take 1 tablet (1,000 mg total) by mouth 2 (two) times daily as needed. 60 tablet 0   No current facility-administered medications on file prior to visit.        ROS:  All others reviewed and negative.  Objective        PE:  BP 122/74 (BP Location: Left Arm, Patient Position: Sitting, Cuff Size: Normal)   Pulse 62   Temp 97.7 F (36.5 C) (Oral)   Ht 5\' 3"  (1.6 m)   Wt 122 lb (55.3 kg)   SpO2 99%   BMI 21.61 kg/m                 Constitutional: Pt appears in NAD               HENT: Head: NCAT.                Right Ear: External ear normal.                 Left Ear: External ear normal.                Eyes: . Pupils are equal, round, and reactive to light. Conjunctivae and EOM are normal               Nose: without d/c or deformity               Neck: Neck supple. Gross normal ROM               Cardiovascular: Normal rate and regular rhythm.                 Pulmonary/Chest: Effort normal and breath sounds without rales or wheezing.                Abd:  Soft, NT, ND, + BS, no organomegaly               Neurological: Pt is alert. At baseline orientation, motor grossly intact               Skin: Skin is warm. No rashes, no other new lesions, LE edema - none               Psychiatric: Pt behavior is normal without agitation   Micro: none  Cardiac tracings I have personally interpreted today:  none  Pertinent  Radiological findings (summarize): none   Lab Results  Component Value Date   WBC 15.7 (H) 08/03/2023   HGB 13.3 08/03/2023   HCT 39.7 08/03/2023   PLT 334.0 08/03/2023   GLUCOSE 78 08/03/2023   CHOL 191 08/03/2023   TRIG 281.0 (H) 08/03/2023   HDL 58.50 08/03/2023   LDLDIRECT 106.0 06/06/2021   LDLCALC 77 08/03/2023   ALT 9 08/03/2023   AST 13 08/03/2023   NA 137 08/03/2023   K 4.9 08/03/2023   CL 100  08/03/2023   CREATININE 1.50 (H) 08/03/2023   BUN 23 08/03/2023   CO2 28 08/03/2023   TSH 2.94 08/03/2023   HGBA1C 5.8 08/03/2023   MICROALBUR 1.5 08/03/2023   Assessment/Plan:  [name] is a 73 y.o. White or Caucasian [1] female with  has a past medical history of ANXIETY (12/01/2006), Arthritis, Blepharospasm (12/01/2006), BONE SPUR (12/01/2006), Cellulitis and abscess of oral soft tissues (05/01/2008), Cervicalgia (08/24/2008), COLD SORE (05/30/2008), DEPRESSION (12/01/2006), EXCISION OF GANGLION CYST, WRIST, HX OF (12/01/2006), GERD (12/01/2006), HYPERLIPIDEMIA (12/01/2006), HYPERTENSION, BORDERLINE (12/01/2006), LOW BACK PAIN (12/01/2006), MACULAR DEGENERATION (12/01/2006), OTITIS MEDIA, ACUTE, LEFT (05/30/2008), PARESTHESIA (08/24/2008), and SINUSITIS- ACUTE-NOS (01/07/2010).  Encounter for well adult exam with abnormal findings Age and sex appropriate education and counseling updated with regular exercise and diet Referrals for preventative services - declines dxa and mammogram Immunizations addressed - none needed Smoking counseling  - pt counsled to quit, pt not ready Evidence for depression or other mood disorder - none significant Most recent labs reviewed. I have personally reviewed and have noted: 1) the patient's medical and social history 2) The patient's current medications and supplements 3) The patient's height, weight, and BMI have been recorded in the chart   Hyperlipidemia Lab Results  Component Value Date   LDLCALC 77 08/03/2023   Stable, pt to continue repatha 140 mg   Essential hypertension BP Readings from Last 3 Encounters:  08/03/23 122/74  08/03/23 (!) 110/52  05/12/23 118/62   Stable, pt to continue medical treatment amlodipine 5 mg every day, hct 12.5 mg every day, losartan 100 qd   Smoker Pt counsled to quit, pt not ready  CKD (chronic kidney disease) stage 3, GFR 30-59 ml/min (HCC) Lab Results  Component Value Date   CREATININE 1.50 (H) 08/03/2023   Stable  overall, cont to avoid nephrotoxins   B12 deficiency Lab Results  Component Value Date   VITAMINB12 460 08/03/2023   Stable, cont oral replacement - b12 1000 mcg qd   Vitamin D deficiency Last vitamin D Lab Results  Component Value Date   VD25OH 42.09 08/03/2023   Stable, cont oral replacement  Followup: Return in about 1 year (around 08/02/2024).  Oliver Barre, MD 08/05/2023 9:15 PM [facility]

## 2023-08-03 NOTE — Patient Instructions (Addendum)
 Hold meloxicam for today Injections in backside Gabapentin 200mg  BID See me again in 2 months

## 2023-08-03 NOTE — Assessment & Plan Note (Signed)
 Multiple joint complaint.  Has continued to have difficulty.  We discussed different medications again including the meloxicam and not using it on a regular basis secondary to her kidney function.  We did discuss the Flexeril for breakthrough pain.  Discussed icing regimen and home exercises, discussed which activities to do and which ones to avoid.  Discussed which activities to do and which ones to avoid.  Follow-up again in 6 to 8 weeks otherwise.

## 2023-08-03 NOTE — Patient Instructions (Signed)
 Please continue all other medications as before, and refills have been done if requested.  Please have the pharmacy call with any other refills you may need.  Please continue your efforts at being more active, low cholesterol diet, and weight control.  You are otherwise up to date with prevention measures today.  Please keep your appointments with your specialists as you may have planned  Please quit smoking  Please go to the LAB at the blood drawing area for the tests to be done  You will be contacted by phone if any changes need to be made immediately.  Otherwise, you will receive a letter about your results with an explanation, but please check with MyChart first.  Please make an Appointment to return for your 1 year visit, or sooner if needed

## 2023-08-04 ENCOUNTER — Encounter: Payer: Self-pay | Admitting: Internal Medicine

## 2023-08-05 ENCOUNTER — Encounter: Payer: Self-pay | Admitting: Internal Medicine

## 2023-08-05 NOTE — Assessment & Plan Note (Signed)
 Lab Results  Component Value Date   CREATININE 1.50 (H) 08/03/2023   Stable overall, cont to avoid nephrotoxins

## 2023-08-05 NOTE — Assessment & Plan Note (Signed)
 Last vitamin D Lab Results  Component Value Date   VD25OH 42.09 08/03/2023   Stable, cont oral replacement

## 2023-08-05 NOTE — Assessment & Plan Note (Signed)
 Pt counsled to quit, pt not ready

## 2023-08-05 NOTE — Assessment & Plan Note (Signed)
 Lab Results  Component Value Date   LDLCALC 77 08/03/2023   Stable, pt to continue repatha 140 mg

## 2023-08-05 NOTE — Assessment & Plan Note (Signed)
 Age and sex appropriate education and counseling updated with regular exercise and diet Referrals for preventative services - declines dxa and mammogram Immunizations addressed - none needed Smoking counseling  - pt counsled to quit, pt not ready Evidence for depression or other mood disorder - none significant Most recent labs reviewed. I have personally reviewed and have noted: 1) the patient's medical and social history 2) The patient's current medications and supplements 3) The patient's height, weight, and BMI have been recorded in the chart

## 2023-08-05 NOTE — Assessment & Plan Note (Signed)
 BP Readings from Last 3 Encounters:  08/03/23 122/74  08/03/23 (!) 110/52  05/12/23 118/62   Stable, pt to continue medical treatment amlodipine 5 mg every day, hct 12.5 mg every day, losartan 100 qd

## 2023-08-05 NOTE — Assessment & Plan Note (Signed)
 Lab Results  Component Value Date   VITAMINB12 460 08/03/2023   Stable, cont oral replacement - b12 1000 mcg qd

## 2023-08-12 ENCOUNTER — Other Ambulatory Visit: Payer: Medicare Other

## 2023-08-12 ENCOUNTER — Ambulatory Visit: Payer: Medicare Other

## 2023-08-18 ENCOUNTER — Ambulatory Visit (INDEPENDENT_AMBULATORY_CARE_PROVIDER_SITE_OTHER): Admitting: Internal Medicine

## 2023-08-18 ENCOUNTER — Encounter: Payer: Self-pay | Admitting: Internal Medicine

## 2023-08-18 VITALS — BP 136/80 | HR 71 | Temp 98.5°F | Ht 63.0 in | Wt 122.0 lb

## 2023-08-18 DIAGNOSIS — G43909 Migraine, unspecified, not intractable, without status migrainosus: Secondary | ICD-10-CM | POA: Insufficient documentation

## 2023-08-18 DIAGNOSIS — I10 Essential (primary) hypertension: Secondary | ICD-10-CM | POA: Diagnosis not present

## 2023-08-18 DIAGNOSIS — G43809 Other migraine, not intractable, without status migrainosus: Secondary | ICD-10-CM | POA: Diagnosis not present

## 2023-08-18 DIAGNOSIS — F17200 Nicotine dependence, unspecified, uncomplicated: Secondary | ICD-10-CM | POA: Diagnosis not present

## 2023-08-18 DIAGNOSIS — S30860A Insect bite (nonvenomous) of lower back and pelvis, initial encounter: Secondary | ICD-10-CM

## 2023-08-18 DIAGNOSIS — W57XXXA Bitten or stung by nonvenomous insect and other nonvenomous arthropods, initial encounter: Secondary | ICD-10-CM

## 2023-08-18 MED ORDER — BUTALBITAL-APAP-CAFFEINE 50-325-40 MG PO TABS: 1.0000 | ORAL_TABLET | Freq: Every day | ORAL | 0 refills | Status: DC | PRN

## 2023-08-18 MED ORDER — DOXYCYCLINE HYCLATE 100 MG PO TABS: 100.0000 mg | ORAL_TABLET | Freq: Two times a day (BID) | ORAL | 0 refills | Status: DC

## 2023-08-18 NOTE — Patient Instructions (Signed)
 Please take all new medication as prescribed

## 2023-08-18 NOTE — Progress Notes (Signed)
 Patient ID: [name], female   DOB: 10/04/1950, 73 y.o.   MRN: [id number]        Chief Complaint: follow up multiple tick bites, migraine, htn, smoker       HPI:  [name] is a 73 y.o. female here after found to have 4 ticks on her back (3 to lumbar area 1 to right upper back) wondering if all was removed.  No fever, rash, but concerned about possible tick borne disease.  Also with recent onset recurring left sided typical migraine with multiple worsening family stressors.  Pt denies chest pain, increased sob or doe, wheezing, orthopnea, PND, increased LE swelling, palpitations, dizziness or syncope.   Pt denies polydipsia, polyuria, or new focal neuro s/s.  Still smoking, not ready to quit.       Wt Readings from Last 3 Encounters:  08/18/23 122 lb (55.3 kg)  08/03/23 122 lb (55.3 kg)  08/03/23 122 lb (55.3 kg)   BP Readings from Last 3 Encounters:  08/18/23 136/80  08/03/23 122/74  08/03/23 (!) 110/52         Past Medical History:  Diagnosis Date   ANXIETY 12/01/2006   Arthritis    Blepharospasm 12/01/2006   BONE SPUR 12/01/2006   Cellulitis and abscess of oral soft tissues 05/01/2008   Cervicalgia 08/24/2008   COLD SORE 05/30/2008   DEPRESSION 12/01/2006   EXCISION OF GANGLION CYST, WRIST, HX OF 12/01/2006   GERD 12/01/2006   HYPERLIPIDEMIA 12/01/2006   HYPERTENSION, BORDERLINE 12/01/2006   LOW BACK PAIN 12/01/2006   MACULAR DEGENERATION 12/01/2006   OTITIS MEDIA, ACUTE, LEFT 05/30/2008   PARESTHESIA 08/24/2008   SINUSITIS- ACUTE-NOS 01/07/2010   Past Surgical History:  Procedure Laterality Date   ABDOMINAL HYSTERECTOMY     blepharospasm     bone spur     removal right foot   BREAST LUMPECTOMY     right   GANGLION CYST EXCISION     H/O Macular degeneration      reports that she has been smoking. She has never used smokeless tobacco. She reports that she does not drink alcohol and does not use drugs. family history includes Diabetes in her daughter; Heart attack in her father; Heart  disease in her father; Hypertension in her mother; Pulmonary fibrosis in her mother. Allergies  Allergen Reactions   Codeine    Hydrocodone -Acetaminophen     Loratadine-Pseudoephedrine Er    Statins Other (See Comments)    Joint and muscle cramp   Augmentin  [Amoxicillin -Pot Clavulanate] Diarrhea and Nausea And Vomiting   Current Outpatient Medications on File Prior to Visit  Medication Sig Dispense Refill   amLODipine  (NORVASC ) 5 MG tablet TAKE 1 TABLET(5 MG) BY MOUTH DAILY 90 tablet 3   aspirin 81 MG tablet Take 81 mg by mouth daily.     azithromycin  (ZITHROMAX ) 250 MG tablet Take by mouth.     clonazePAM  (KLONOPIN ) 1 MG tablet TAKE 1/2 TO 1 TABLET BY MOUTH TWICE DAILY AS NEEDED 60 tablet 5   cyclobenzaprine  (FLEXERIL ) 5 MG tablet TAKE 1 TABLET(5 MG) BY MOUTH AT BEDTIME AS NEEDED FOR MUSCLE SPASMS 30 tablet 0   Evolocumab  (REPATHA  SURECLICK) 140 MG/ML SOAJ Take 1 injection every 2 weeks 2 mL 11   famotidine  (PEPCID ) 20 MG tablet Take 1 tablet (20 mg total) by mouth 2 (two) times daily. 180 tablet 1   FLUoxetine  (PROZAC ) 40 MG capsule Take 40 mg by mouth daily.     gabapentin  (NEURONTIN ) 100 MG capsule  Take 2 capsules (200 mg total) by mouth 2 (two) times daily. 180 capsule 0   hydrochlorothiazide  (MICROZIDE ) 12.5 MG capsule TAKE 1 CAPSULE(12.5 MG) BY MOUTH DAILY 90 capsule 3   losartan  (COZAAR ) 100 MG tablet Take 1 tablet (100 mg total) by mouth daily. 90 tablet 3   meloxicam  (MOBIC ) 7.5 MG tablet TAKE 1 TABLET(7.5 MG) BY MOUTH DAILY 30 tablet 0   predniSONE  (DELTASONE ) 10 MG tablet 3 tabs by mouth per day for 3 days,2tabs per day for 3 days,1tab per day for 3 days 18 tablet 0   tiZANidine  (ZANAFLEX ) 2 MG tablet Take 2 mg by mouth at bedtime.     valACYclovir  (VALTREX ) 1000 MG tablet Take 1 tablet (1,000 mg total) by mouth 2 (two) times daily as needed. 60 tablet 0   No current facility-administered medications on file prior to visit.        ROS:  All others reviewed and  negative.  Objective        PE:  BP 136/80 (BP Location: Right Arm, Patient Position: Sitting, Cuff Size: Normal)   Pulse 71   Temp 98.5 F (36.9 C) (Oral)   Ht 5\' 3"  (1.6 m)   Wt 122 lb (55.3 kg)   SpO2 99%   BMI 21.61 kg/m                 Constitutional: Pt appears in NAD               HENT: Head: NCAT.                Right Ear: External ear normal.                 Left Ear: External ear normal.                Eyes: . Pupils are equal, round, and reactive to light. Conjunctivae and EOM are normal               Nose: without d/c or deformity               Neck: Neck supple. Gross normal ROM               Cardiovascular: Normal rate and regular rhythm.                 Pulmonary/Chest: Effort normal and breath sounds without rales or wheezing.                Abd:  Soft, NT, ND, + BS, no organomegaly               Neurological: Pt is alert. At baseline orientation, motor grossly intact               Skin: Skin is warm. LE edema - none, pt has 4 small slightly raised erythema areas  - 3 to lower back, 1 to right upper back                Psychiatric: Pt behavior is normal without agitation   Micro: none  Cardiac tracings I have personally interpreted today:  none  Pertinent Radiological findings (summarize): none   Lab Results  Component Value Date   WBC 15.7 (H) 08/03/2023   HGB 13.3 08/03/2023   HCT 39.7 08/03/2023   PLT 334.0 08/03/2023   GLUCOSE 78 08/03/2023   CHOL 191 08/03/2023   TRIG 281.0 (H) 08/03/2023   HDL 58.50 08/03/2023   LDLDIRECT 106.0  06/06/2021   LDLCALC 77 08/03/2023   ALT 9 08/03/2023   AST 13 08/03/2023   NA 137 08/03/2023   K 4.9 08/03/2023   CL 100 08/03/2023   CREATININE 1.50 (H) 08/03/2023   BUN 23 08/03/2023   CO2 28 08/03/2023   TSH 2.94 08/03/2023   HGBA1C 5.8 08/03/2023   MICROALBUR 1.5 08/03/2023   Assessment/Plan:  [name] is a 73 y.o. White or Caucasian [1] female with  has a past medical history of ANXIETY (12/01/2006),  Arthritis, Blepharospasm (12/01/2006), BONE SPUR (12/01/2006), Cellulitis and abscess of oral soft tissues (05/01/2008), Cervicalgia (08/24/2008), COLD SORE (05/30/2008), DEPRESSION (12/01/2006), EXCISION OF GANGLION CYST, WRIST, HX OF (12/01/2006), GERD (12/01/2006), HYPERLIPIDEMIA (12/01/2006), HYPERTENSION, BORDERLINE (12/01/2006), LOW BACK PAIN (12/01/2006), MACULAR DEGENERATION (12/01/2006), OTITIS MEDIA, ACUTE, LEFT (05/30/2008), PARESTHESIA (08/24/2008), and SINUSITIS- ACUTE-NOS (01/07/2010).  Essential hypertension BP Readings from Last 3 Encounters:  08/18/23 136/80  08/03/23 122/74  08/03/23 (!) 110/52   Stable, pt to continue medical treatment norvasc  5 qd   Smoker Pt counsled to quit, pt not ready  Tick bite of back Mild to mod, for antibx course doxycyline 100 bid,  to f/u any worsening symptoms or concerns  Migraine Mild to mod, recent onset, neuro exam benign, declines cns imaging for fioricet asd prn,  to f/u any worsening symptoms or concerns  Followup: Return if symptoms worsen or fail to improve.  Rosalia Colonel, MD 08/18/2023 8:18 PM [facility] [place] [facility] - [facility]

## 2023-08-18 NOTE — Assessment & Plan Note (Signed)
 Pt counsled to quit, pt not ready

## 2023-08-18 NOTE — Assessment & Plan Note (Signed)
 BP Readings from Last 3 Encounters:  08/18/23 136/80  08/03/23 122/74  08/03/23 (!) 110/52   Stable, pt to continue medical treatment norvasc  5 qd

## 2023-08-18 NOTE — Assessment & Plan Note (Signed)
 Mild to mod, for antibx course doxycyline 100 bid,  to f/u any worsening symptoms or concerns

## 2023-08-18 NOTE — Assessment & Plan Note (Signed)
 Mild to mod, recent onset, neuro exam benign, declines cns imaging for fioricet asd prn,  to f/u any worsening symptoms or concerns

## 2023-08-25 ENCOUNTER — Encounter: Payer: Self-pay | Admitting: Internal Medicine

## 2023-08-25 MED ORDER — FLUCONAZOLE 150 MG PO TABS: ORAL_TABLET | ORAL | 1 refills | Status: DC

## 2023-08-29 ENCOUNTER — Other Ambulatory Visit: Payer: Self-pay | Admitting: Internal Medicine

## 2023-08-30 ENCOUNTER — Other Ambulatory Visit: Payer: Self-pay | Admitting: Family Medicine

## 2023-08-31 ENCOUNTER — Other Ambulatory Visit: Payer: Self-pay

## 2023-09-14 ENCOUNTER — Other Ambulatory Visit: Payer: Self-pay | Admitting: Family Medicine

## 2023-09-28 ENCOUNTER — Other Ambulatory Visit: Payer: Self-pay | Admitting: Family Medicine

## 2023-09-29 NOTE — Progress Notes (Signed)
 [name] [address] [place] [address] Phone: [phone] Subjective:   [name], am serving as a scribe for Dr. Ronnell Coins.  I'm seeing this patient by the request  of:  Roslyn Coombe, MD  CC: Back and neck pain follow-up  [id number]  [name] is a 73 y.o. female coming in with complaint of back and neck pain. OMT 08/03/2023. Patient states overall doing okay. Sore. No new concerns or symptoms.  Medications patient has been prescribed: Gabapentin , Meloxicam   Taking:         Reviewed prior external information including notes and imaging from previsou exam, outside providers and external EMR if available.   As well as notes that were available from care everywhere and other healthcare systems.  Past medical history, social, surgical and family history all reviewed in electronic medical record.  No pertanent information unless stated regarding to the chief complaint.   Past Medical History:  Diagnosis Date   ANXIETY 12/01/2006   Arthritis    Blepharospasm 12/01/2006   BONE SPUR 12/01/2006   Cellulitis and abscess of oral soft tissues 05/01/2008   Cervicalgia 08/24/2008   COLD SORE 05/30/2008   DEPRESSION 12/01/2006   EXCISION OF GANGLION CYST, WRIST, HX OF 12/01/2006   GERD 12/01/2006   HYPERLIPIDEMIA 12/01/2006   HYPERTENSION, BORDERLINE 12/01/2006   LOW BACK PAIN 12/01/2006   MACULAR DEGENERATION 12/01/2006   OTITIS MEDIA, ACUTE, LEFT 05/30/2008   PARESTHESIA 08/24/2008   SINUSITIS- ACUTE-NOS 01/07/2010    Allergies  Allergen Reactions   Codeine    Hydrocodone -Acetaminophen     Loratadine-Pseudoephedrine Er    Statins Other (See Comments)    Joint and muscle cramp   Augmentin  [Amoxicillin -Pot Clavulanate] Diarrhea and Nausea And Vomiting     Review of Systems:  No headache, visual changes, nausea, vomiting, diarrhea, constipation, dizziness, abdominal pain, skin rash, fevers, chills, night sweats, weight loss, swollen lymph  nodes, body aches, joint swelling, chest pain, shortness of breath, mood changes. POSITIVE muscle aches  Objective  Blood pressure 118/62, pulse 75, height 5\' 3"  (1.6 m), weight 122 lb (55.3 kg), SpO2 99%.   General: No apparent distress alert and oriented x3 mood and affect normal, dressed appropriately.  HEENT: Pupils equal, extraocular movements intact  Respiratory: Patient's speak in full sentences and does not appear short of breath  Cardiovascular: No lower extremity edema, non tender, no erythema  Gait MSK:  Back   Osteopathic findings  C3 flexed rotated and side bent right C7 flexed rotated and side bent left T3 extended rotated and side bent right inhaled rib T8 extended rotated and side bent left L2 flexed rotated and side bent right Sacrum right on right     Assessment and Plan:  LOW BACK PAIN Continues to have severe pain overall.  Toradol  and Depo-Medrol  given.  Discussed again about the possibility of advanced imaging.  Would love to get new x-rays.  Patient declined today but will consider in the next morning if she does continue to have these exacerbations and then would need to consider the possibility of advanced imaging with an MRI.  Patient is in agreement with this.  Discussed icing regimen and home exercises otherwise.  Follow-up again in 6 to 8 weeks    Nonallopathic problems  Decision today to treat with OMT was based on Physical Exam  After verbal consent patient was treated with , ME, FPR techniques in cervical, rib, thoracic, lumbar, and sacral  areas only  did FPR on the cervical spine.  Patient tolerated the procedure well with improvement in symptoms  Patient given exercises, stretches and lifestyle modifications  See medications in patient instructions if given  Patient will follow up in 4-8 weeks    The above documentation has been reviewed and is accurate and complete Kim Kempner M Miara Emminger, DO          Note: This dictation was prepared  with Dragon dictation along with smaller phrase technology. Any transcriptional errors that result from this process are unintentional.

## 2023-10-06 ENCOUNTER — Encounter: Payer: Self-pay | Admitting: Family Medicine

## 2023-10-06 ENCOUNTER — Ambulatory Visit: Admitting: Family Medicine

## 2023-10-06 VITALS — BP 118/62 | HR 75 | Ht 63.0 in | Wt 122.0 lb

## 2023-10-06 DIAGNOSIS — M9901 Segmental and somatic dysfunction of cervical region: Secondary | ICD-10-CM | POA: Diagnosis not present

## 2023-10-06 DIAGNOSIS — M9904 Segmental and somatic dysfunction of sacral region: Secondary | ICD-10-CM

## 2023-10-06 DIAGNOSIS — M545 Low back pain, unspecified: Secondary | ICD-10-CM

## 2023-10-06 DIAGNOSIS — M542 Cervicalgia: Secondary | ICD-10-CM | POA: Diagnosis not present

## 2023-10-06 DIAGNOSIS — M9908 Segmental and somatic dysfunction of rib cage: Secondary | ICD-10-CM

## 2023-10-06 DIAGNOSIS — M9903 Segmental and somatic dysfunction of lumbar region: Secondary | ICD-10-CM

## 2023-10-06 DIAGNOSIS — M9902 Segmental and somatic dysfunction of thoracic region: Secondary | ICD-10-CM

## 2023-10-06 MED ORDER — METHYLPREDNISOLONE ACETATE 40 MG/ML IJ SUSP
80.0000 mg | Freq: Once | INTRAMUSCULAR | Status: AC
  Administered 2023-10-06: 80 mg via INTRAMUSCULAR

## 2023-10-06 MED ORDER — KETOROLAC TROMETHAMINE 60 MG/2ML IM SOLN
60.0000 mg | Freq: Once | INTRAMUSCULAR | Status: AC
  Administered 2023-10-06: 60 mg via INTRAMUSCULAR

## 2023-10-06 NOTE — Patient Instructions (Addendum)
 Full cocktail given today. Wish you the best See me in 2-3 months

## 2023-10-06 NOTE — Assessment & Plan Note (Signed)
 Chronic problem with exacerbation as well.  Discussed icing regimen and home exercises, discussed which activities to do and which ones to avoid.  Toradol  and Depo-Medrol  given.  Discussed that Zanaflex  at night if needed.  Have to be careful with patient's chronic kidney disease.  Consider advanced imaging if continuing to have difficulty.

## 2023-10-06 NOTE — Assessment & Plan Note (Signed)
 Continues to have severe pain overall.  Toradol  and Depo-Medrol  given.  Discussed again about the possibility of advanced imaging.  Would love to get new x-rays.  Patient declined today but will consider in the next morning if she does continue to have these exacerbations and then would need to consider the possibility of advanced imaging with an MRI.  Patient is in agreement with this.  Discussed icing regimen and home exercises otherwise.  Follow-up again in 6 to 8 weeks

## 2023-10-16 ENCOUNTER — Other Ambulatory Visit: Payer: Self-pay

## 2023-10-16 ENCOUNTER — Encounter: Payer: Self-pay | Admitting: Internal Medicine

## 2023-10-16 ENCOUNTER — Other Ambulatory Visit: Payer: Self-pay | Admitting: Internal Medicine

## 2023-10-20 ENCOUNTER — Encounter: Payer: Self-pay | Admitting: Internal Medicine

## 2023-10-20 ENCOUNTER — Other Ambulatory Visit: Payer: Self-pay | Admitting: Internal Medicine

## 2023-10-21 ENCOUNTER — Other Ambulatory Visit: Payer: Self-pay

## 2023-11-03 ENCOUNTER — Other Ambulatory Visit: Payer: Self-pay | Admitting: Family Medicine

## 2023-11-04 ENCOUNTER — Other Ambulatory Visit: Payer: Self-pay | Admitting: Family Medicine

## 2023-12-01 ENCOUNTER — Encounter: Payer: Self-pay | Admitting: Family Medicine

## 2023-12-02 ENCOUNTER — Other Ambulatory Visit: Payer: Self-pay

## 2023-12-02 MED ORDER — PREDNISONE 20 MG PO TABS: 20.0000 mg | ORAL_TABLET | Freq: Every day | ORAL | 0 refills | Status: DC

## 2023-12-05 ENCOUNTER — Other Ambulatory Visit: Payer: Self-pay | Admitting: Internal Medicine

## 2023-12-05 ENCOUNTER — Encounter: Payer: Self-pay | Admitting: Internal Medicine

## 2023-12-10 ENCOUNTER — Other Ambulatory Visit: Payer: Self-pay | Admitting: Family Medicine

## 2023-12-16 ENCOUNTER — Ambulatory Visit: Admitting: Family Medicine

## 2023-12-25 ENCOUNTER — Other Ambulatory Visit: Payer: Self-pay | Admitting: Internal Medicine

## 2023-12-25 ENCOUNTER — Encounter: Payer: Self-pay | Admitting: Internal Medicine

## 2023-12-25 ENCOUNTER — Other Ambulatory Visit: Payer: Self-pay | Admitting: Family Medicine

## 2023-12-26 ENCOUNTER — Telehealth: Admitting: Physician Assistant

## 2023-12-26 DIAGNOSIS — B9689 Other specified bacterial agents as the cause of diseases classified elsewhere: Secondary | ICD-10-CM | POA: Diagnosis not present

## 2023-12-26 DIAGNOSIS — J019 Acute sinusitis, unspecified: Secondary | ICD-10-CM

## 2023-12-26 MED ORDER — IPRATROPIUM BROMIDE 0.03 % NA SOLN
2.0000 | Freq: Two times a day (BID) | NASAL | 0 refills | Status: AC
Start: 2023-12-26 — End: ?

## 2023-12-26 MED ORDER — DOXYCYCLINE HYCLATE 100 MG PO TABS: 100.0000 mg | ORAL_TABLET | Freq: Two times a day (BID) | ORAL | 0 refills | Status: DC

## 2023-12-26 NOTE — Progress Notes (Signed)
 Virtual Visit Consent   [name], you are scheduled for a virtual visit with a [facility] provider today. Just as with appointments in the office, your consent must be obtained to participate. Your consent will be active for this visit and any virtual visit you may have with one of our providers in the next 365 days. If you have a MyChart account, a copy of this consent can be sent to you electronically.  As this is a virtual visit, video technology does not allow for your provider to perform a traditional examination. This may limit your provider's ability to fully assess your condition. If your provider identifies any concerns that need to be evaluated in person or the need to arrange testing (such as labs, EKG, etc.), we will make arrangements to do so. Although advances in technology are sophisticated, we cannot ensure that it will always work on either your end or our end. If the connection with a video visit is poor, the visit may have to be switched to a telephone visit. With either a video or telephone visit, we are not always able to ensure that we have a secure connection.  By engaging in this virtual visit, you consent to the provision of healthcare and authorize for your insurance to be billed (if applicable) for the services provided during this visit. Depending on your insurance coverage, you may receive a charge related to this service.  I need to obtain your verbal consent now. Are you willing to proceed with your visit today? [name] has provided verbal consent on 12/26/2023 for a virtual visit (video or telephone). [name], [place]  Date: 12/26/2023 12:21 PM   Virtual Visit via Video Note   I, [name], connected with  [name]  ([id number], 04-19-1951) on 12/26/23 at 12:15 PM EDT by a video-enabled telemedicine application and verified that I am speaking with the correct person using two identifiers.  Location: Patient: Virtual Visit Location  Patient: Home Provider: Virtual Visit Location Provider: Home Office   I discussed the limitations of evaluation and management by telemedicine and the availability of in person appointments. The patient expressed understanding and agreed to proceed.    History of Present Illness: [name] is a 73 y.o. who identifies as a female who was assigned female at birth, and is being seen today for concern of sinusitis. Notes ongoing nasal drainage and sinus pressure worsening over past 3 days with some occasional chills, sweats and low-grade fever. Some cough of clear phlegm. Notes sinus and facial pain. Is taking her Zyrtec daily as directed. Denies sick contact.   HPI: HPI  Problems:  Patient Active Problem List   Diagnosis Date Noted   Tick bite of back 08/18/2023   Migraine 08/18/2023   Acute sinus infection 07/22/2023   Asthma exacerbation 02/04/2023   Allergic rhinitis 02/02/2023   Anxiety and depression 03/06/2022   SI (sacroiliac) joint dysfunction 09/24/2021   Somatic dysfunction of spine, sacral 09/24/2021   Impacted cerumen, left ear 06/09/2021   Bradycardia 05/07/2020   Heart murmur 05/07/2020   CKD (chronic kidney disease) stage 3, GFR 30-59 ml/min (HCC) 05/07/2020   B12 deficiency 05/07/2020   Vitamin D  deficiency 05/07/2020   Exposure to COVID-19 virus 11/24/2018   Smoker 11/24/2018   Occipital pain 10/23/2017   Arthritis of carpometacarpal [facility]) joints of both thumbs 08/19/2017   Left ear hearing loss 05/20/2017   Diarrhea 04/09/2017   Vertigo 04/09/2017   Arthritis  of carpometacarpal [facility]) joint of right thumb 01/29/2017   Right hand pain 01/15/2017   Neck pain on right side 01/15/2017   Cellulitis of right thigh 12/17/2016   Right arm pain 08/07/2016   Hypotension 04/08/2016   Chronic constipation 04/08/2016   Pain in joint of right shoulder 03/15/2016   Allergic conjunctivitis 01/26/2015   Wheezing 07/27/2014   Wellness examination 02/14/2014   Cough  02/14/2014   Salivary gland calculus 07/20/2011   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   Tick bite, infected 02/26/2011   Encounter for well adult exam with abnormal findings 11/21/2010   Cervicalgia 08/24/2008   PARESTHESIA 08/24/2008   Hyperlipidemia 12/01/2006   Blepharospasm 12/01/2006   Macular degeneration (senile) of retina 12/01/2006   Essential hypertension 12/01/2006   GERD 12/01/2006   LOW BACK PAIN 12/01/2006   Bony exostosis 12/01/2006   EXCISION OF GANGLION CYST, WRIST, HX OF 12/01/2006    Allergies:  Allergies  Allergen Reactions   Codeine    Hydrocodone -Acetaminophen     Loratadine-Pseudoephedrine Er    Statins Other (See Comments)    Joint and muscle cramp   Augmentin  [Amoxicillin -Pot Clavulanate] Diarrhea and Nausea And Vomiting   Medications:  Current Outpatient Medications:    doxycycline  (VIBRA -TABS) 100 MG tablet, Take 1 tablet (100 mg total) by mouth 2 (two) times daily., Disp: 20 tablet, Rfl: 0   ipratropium (ATROVENT ) 0.03 % nasal spray, Place 2 sprays into both nostrils every 12 (twelve) hours., Disp: 30 mL, Rfl: 0   amLODipine  (NORVASC ) 5 MG tablet, TAKE 1 TABLET(5 MG) BY MOUTH DAILY, Disp: 90 tablet, Rfl: 3   aspirin 81 MG tablet, Take 81 mg by mouth daily., Disp: , Rfl:    butalbital -acetaminophen -caffeine  (FIORICET) 50-325-40 MG tablet, TAKE 1 TABLET BY MOUTH DAILY AS NEEDED FOR HEADACHE, Disp: 14 tablet, Rfl: 2   clonazePAM  (KLONOPIN ) 1 MG tablet, TAKE 1/2 TO 1 TABLET BY MOUTH TWICE DAILY AS NEEDED, Disp: 60 tablet, Rfl: 5   cyclobenzaprine  (FLEXERIL ) 5 MG tablet, TAKE 1 TABLET(5 MG) BY MOUTH AT BEDTIME AS NEEDED FOR MUSCLE SPASMS, Disp: 30 tablet, Rfl: 0   Evolocumab  (REPATHA  SURECLICK) 140 MG/ML SOAJ, Take 1 injection every 2 weeks, Disp: 2 mL, Rfl: 11   famotidine  (PEPCID ) 20 MG tablet, TAKE 1 TABLET(20 MG) BY MOUTH TWICE DAILY, Disp: 180 tablet, Rfl: 1   fluconazole  (DIFLUCAN ) 150 MG tablet, 1 tab by mouth every 3 days as needed, Disp: 2 tablet, Rfl: 1    FLUoxetine  (PROZAC ) 40 MG capsule, Take 40 mg by mouth daily., Disp: , Rfl:    gabapentin  (NEURONTIN ) 100 MG capsule, TAKE 2 CAPSULES(200 MG) BY MOUTH TWICE DAILY, Disp: 180 capsule, Rfl: 0   hydrochlorothiazide  (MICROZIDE ) 12.5 MG capsule, TAKE 1 CAPSULE(12.5 MG) BY MOUTH DAILY, Disp: 90 capsule, Rfl: 3   losartan  (COZAAR ) 100 MG tablet, TAKE 1 TABLET(100 MG) BY MOUTH DAILY, Disp: 90 tablet, Rfl: 3   meloxicam  (MOBIC ) 7.5 MG tablet, TAKE 1 TABLET(7.5 MG) BY MOUTH DAILY, Disp: 30 tablet, Rfl: 0   tiZANidine  (ZANAFLEX ) 2 MG tablet, Take 2 mg by mouth at bedtime., Disp: , Rfl:    valACYclovir  (VALTREX ) 1000 MG tablet, TAKE 1 TABLET(1000 MG) BY MOUTH TWICE DAILY AS NEEDED, Disp: 60 tablet, Rfl: 0  Observations/Objective: Patient is well-developed, well-nourished in no acute distress.  Resting comfortably  at home.  Head is normocephalic, atraumatic.  No labored breathing.  Speech is clear and coherent with logical content.  Patient is alert and oriented at baseline.   Assessment and Plan:  1. Acute bacterial sinusitis (Primary) - doxycycline  (VIBRA -TABS) 100 MG tablet; Take 1 tablet (100 mg total) by mouth 2 (two) times daily.  Dispense: 20 tablet; Refill: 0 - ipratropium (ATROVENT ) 0.03 % nasal spray; Place 2 sprays into both nostrils every 12 (twelve) hours.  Dispense: 30 mL; Refill: 0  Rx Doxycycline .  Increase fluids.  Rest.  Saline nasal spray.  Probiotic.  Mucinex as directed.  Humidifier in bedroom. Atrovent  spray per orders.  Call or return to clinic if symptoms are not improving.   Follow Up Instructions: I discussed the assessment and treatment plan with the patient. The patient was provided an opportunity to ask questions and all were answered. The patient agreed with the plan and demonstrated an understanding of the instructions.  A copy of instructions were sent to the patient via MyChart unless otherwise noted below.    The patient was advised to call back or seek an in-person  evaluation if the symptoms worsen or if the condition fails to improve as anticipated.    Kim Velma Lunger, PA-C

## 2023-12-26 NOTE — Patient Instructions (Addendum)
 [name], thank you for joining Kim Velma Lunger, PA-C for today's virtual visit.  While this provider is not your primary care provider (PCP), if your PCP is located in our provider database this encounter information will be shared with them immediately following your visit.   A [facility] MyChart account gives you access to today's visit and all your visits, tests, and labs performed at [facility]  click here if you don't have a [name] MyChart account or go to [email or web address]  Consent: (Patient) [name] provided verbal consent for this virtual visit at the beginning of the encounter.  Current Medications:  Current Outpatient Medications:    doxycycline  (VIBRA -TABS) 100 MG tablet, Take 1 tablet (100 mg total) by mouth 2 (two) times daily., Disp: 20 tablet, Rfl: 0   ipratropium (ATROVENT ) 0.03 % nasal spray, Place 2 sprays into both nostrils every 12 (twelve) hours., Disp: 30 mL, Rfl: 0   amLODipine  (NORVASC ) 5 MG tablet, TAKE 1 TABLET(5 MG) BY MOUTH DAILY, Disp: 90 tablet, Rfl: 3   aspirin 81 MG tablet, Take 81 mg by mouth daily., Disp: , Rfl:    butalbital -acetaminophen -caffeine  (FIORICET) 50-325-40 MG tablet, TAKE 1 TABLET BY MOUTH DAILY AS NEEDED FOR HEADACHE, Disp: 14 tablet, Rfl: 2   clonazePAM  (KLONOPIN ) 1 MG tablet, TAKE 1/2 TO 1 TABLET BY MOUTH TWICE DAILY AS NEEDED, Disp: 60 tablet, Rfl: 5   cyclobenzaprine  (FLEXERIL ) 5 MG tablet, TAKE 1 TABLET(5 MG) BY MOUTH AT BEDTIME AS NEEDED FOR MUSCLE SPASMS, Disp: 30 tablet, Rfl: 0   Evolocumab  (REPATHA  SURECLICK) 140 MG/ML SOAJ, Take 1 injection every 2 weeks, Disp: 2 mL, Rfl: 11   famotidine  (PEPCID ) 20 MG tablet, TAKE 1 TABLET(20 MG) BY MOUTH TWICE DAILY, Disp: 180 tablet, Rfl: 1   fluconazole  (DIFLUCAN ) 150 MG tablet, 1 tab by mouth every 3 days as needed, Disp: 2 tablet, Rfl: 1   FLUoxetine  (PROZAC ) 40 MG capsule, Take 40 mg by mouth daily., Disp: , Rfl:    gabapentin  (NEURONTIN ) 100 MG  capsule, TAKE 2 CAPSULES(200 MG) BY MOUTH TWICE DAILY, Disp: 180 capsule, Rfl: 0   hydrochlorothiazide  (MICROZIDE ) 12.5 MG capsule, TAKE 1 CAPSULE(12.5 MG) BY MOUTH DAILY, Disp: 90 capsule, Rfl: 3   losartan  (COZAAR ) 100 MG tablet, TAKE 1 TABLET(100 MG) BY MOUTH DAILY, Disp: 90 tablet, Rfl: 3   meloxicam  (MOBIC ) 7.5 MG tablet, TAKE 1 TABLET(7.5 MG) BY MOUTH DAILY, Disp: 30 tablet, Rfl: 0   tiZANidine  (ZANAFLEX ) 2 MG tablet, Take 2 mg by mouth at bedtime., Disp: , Rfl:    valACYclovir  (VALTREX ) 1000 MG tablet, TAKE 1 TABLET(1000 MG) BY MOUTH TWICE DAILY AS NEEDED, Disp: 60 tablet, Rfl: 0   Medications ordered in this encounter:  Meds ordered this encounter  Medications   doxycycline  (VIBRA -TABS) 100 MG tablet    Sig: Take 1 tablet (100 mg total) by mouth 2 (two) times daily.    Dispense:  20 tablet    Refill:  0    Supervising Provider:   LAMPTEY, PHILIP O [[id number]]   ipratropium (ATROVENT ) 0.03 % nasal spray    Sig: Place 2 sprays into both nostrils every 12 (twelve) hours.    Dispense:  30 mL    Refill:  0    Supervising Provider:   BLAISE ALEENE KIDD [[id number]]     *If you need refills on other medications prior to your next appointment, please contact your pharmacy*  Follow-Up: Call back or seek an in-person evaluation if the symptoms  worsen or if the condition fails to improve as anticipated.  [facility] [phone]  Other Instructions Please take antibiotic as directed.  Increase fluid intake.  Use Saline nasal spray.  Take a daily multivitamin. Use the Ipratropium.  Continue your regular allergy medication. Place a humidifier in the bedroom.  Please call or return clinic if symptoms are not improving.  Sinusitis Sinusitis is redness, soreness, and swelling (inflammation) of the paranasal sinuses. Paranasal sinuses are air pockets within the bones of your face (beneath the eyes, the middle of the forehead, or above the eyes). In healthy paranasal sinuses, mucus is  able to drain out, and air is able to circulate through them by way of your nose. However, when your paranasal sinuses are inflamed, mucus and air can become trapped. This can allow bacteria and other germs to grow and cause infection. Sinusitis can develop quickly and last only a short time (acute) or continue over a long period (chronic). Sinusitis that lasts for more than 12 weeks is considered chronic.  CAUSES  Causes of sinusitis include: Allergies. Structural abnormalities, such as displacement of the cartilage that separates your nostrils (deviated septum), which can decrease the air flow through your nose and sinuses and affect sinus drainage. Functional abnormalities, such as when the small hairs (cilia) that line your sinuses and help remove mucus do not work properly or are not present. SYMPTOMS  Symptoms of acute and chronic sinusitis are the same. The primary symptoms are pain and pressure around the affected sinuses. Other symptoms include: Upper toothache. Earache. Headache. Bad breath. Decreased sense of smell and taste. A cough, which worsens when you are lying flat. Fatigue. Fever. Thick drainage from your nose, which often is green and may contain pus (purulent). Swelling and warmth over the affected sinuses. DIAGNOSIS  Your caregiver will perform a physical exam. During the exam, your caregiver may: Look in your nose for signs of abnormal growths in your nostrils (nasal polyps). Tap over the affected sinus to check for signs of infection. View the inside of your sinuses (endoscopy) with a special imaging device with a light attached (endoscope), which is inserted into your sinuses. If your caregiver suspects that you have chronic sinusitis, one or more of the following tests may be recommended: Allergy tests. Nasal culture A sample of mucus is taken from your nose and sent to a lab and screened for bacteria. Nasal cytology A sample of mucus is taken from your nose and  examined by your caregiver to determine if your sinusitis is related to an allergy. TREATMENT  Most cases of acute sinusitis are related to a viral infection and will resolve on their own within 10 days. Sometimes medicines are prescribed to help relieve symptoms (pain medicine, decongestants, nasal steroid sprays, or saline sprays).  However, for sinusitis related to a bacterial infection, your caregiver will prescribe antibiotic medicines. These are medicines that will help kill the bacteria causing the infection.  Rarely, sinusitis is caused by a fungal infection. In theses cases, your caregiver will prescribe antifungal medicine. For some cases of chronic sinusitis, surgery is needed. Generally, these are cases in which sinusitis recurs more than 3 times per year, despite other treatments. HOME CARE INSTRUCTIONS  Drink plenty of water. Water helps thin the mucus so your sinuses can drain more easily. Use a humidifier. Inhale steam 3 to 4 times a day (for example, sit in the bathroom with the shower running). Apply a warm, moist washcloth to your face  3 to 4 times a day, or as directed by your caregiver. Use saline nasal sprays to help moisten and clean your sinuses. Take over-the-counter or prescription medicines for pain, discomfort, or fever only as directed by your caregiver. SEEK IMMEDIATE MEDICAL CARE IF: You have increasing pain or severe headaches. You have nausea, vomiting, or drowsiness. You have swelling around your face. You have vision problems. You have a stiff neck. You have difficulty breathing. MAKE SURE YOU:  Understand these instructions. Will watch your condition. Will get help right away if you are not doing well or get worse. Document Released: 04/14/2005 Document Revised: 07/07/2011 Document Reviewed: 04/29/2011 [facility] Patient Information 2014 [place], [place].    If you have been instructed to have an in-person evaluation today at a local Urgent Care facility,  please use the link below. It will take you to a list of all of our available [facility], including address, phone number and hours of operation. Please do not delay care.  [facility]  If you or a family member do not have a primary care provider, use the link below to schedule a visit and establish care. When you choose a [facility] primary care physician or advanced practice provider, you gain a long-term partner in health. Find a Primary Care Provider  Learn more about [facility]'s in-office and virtual care options:  - Get Care Now

## 2024-01-05 ENCOUNTER — Ambulatory Visit: Admitting: Family Medicine

## 2024-01-19 ENCOUNTER — Ambulatory Visit: Payer: Medicare Other

## 2024-01-19 VITALS — BP 137/66 | HR 81 | Ht 63.0 in | Wt 122.6 lb

## 2024-01-19 DIAGNOSIS — Z23 Encounter for immunization: Secondary | ICD-10-CM

## 2024-01-19 DIAGNOSIS — Z0000 Encounter for general adult medical examination without abnormal findings: Secondary | ICD-10-CM | POA: Diagnosis not present

## 2024-01-19 NOTE — Progress Notes (Signed)
 Subjective:   [name] is a 73 y.o. who presents for a Medicare Wellness preventive visit.  As a reminder, Annual Wellness Visits don't include a physical exam, and some assessments may be limited, especially if this visit is performed virtually. We may recommend an in-person follow-up visit with your provider if needed.  Visit Complete: In person  Persons Participating in Visit: Patient.  AWV Questionnaire: Yes: Patient Medicare AWV questionnaire was completed by the patient on 01/18/2024; I have confirmed that all information answered by patient is correct and no changes since this date.  Cardiac Risk Factors include: advanced age (>37men, >3 women);dyslipidemia;hypertension     Objective:    Today's Vitals   01/19/24 1309  BP: 137/66  Pulse: 81  SpO2: 99%  Weight: 122 lb 9.6 oz (55.6 kg)  Height: 5' 3 (1.6 m)   Body mass index is 21.72 kg/m.     01/19/2024    1:28 PM 01/15/2023   11:45 AM 01/31/2022    1:08 PM  Advanced Directives  Does Patient Have a Medical Advance Directive? Yes No Yes  Type of Estate agent of [place];Living will  Healthcare Power of [place];Living will  Copy of Healthcare Power of Attorney in Chart? No - copy requested  No - copy requested  Would patient like information on creating a medical advance directive?  No - Patient declined     Current Medications (verified) Outpatient Encounter Medications as of 01/19/2024  Medication Sig   amLODipine  (NORVASC ) 5 MG tablet TAKE 1 TABLET(5 MG) BY MOUTH DAILY   aspirin 81 MG tablet Take 81 mg by mouth daily.   butalbital -acetaminophen -caffeine  (FIORICET) 50-325-40 MG tablet TAKE 1 TABLET BY MOUTH DAILY AS NEEDED FOR HEADACHE   clonazePAM  (KLONOPIN ) 1 MG tablet TAKE 1/2 TO 1 TABLET BY MOUTH TWICE DAILY AS NEEDED   cyclobenzaprine  (FLEXERIL ) 5 MG tablet TAKE 1 TABLET(5 MG) BY MOUTH AT BEDTIME AS NEEDED FOR MUSCLE SPASMS   doxycycline  (VIBRA -TABS) 100 MG tablet Take 1 tablet  (100 mg total) by mouth 2 (two) times daily.   Evolocumab  (REPATHA  SURECLICK) 140 MG/ML SOAJ Take 1 injection every 2 weeks   famotidine  (PEPCID ) 20 MG tablet TAKE 1 TABLET(20 MG) BY MOUTH TWICE DAILY   fluconazole  (DIFLUCAN ) 150 MG tablet 1 tab by mouth every 3 days as needed   FLUoxetine  (PROZAC ) 40 MG capsule Take 40 mg by mouth daily.   gabapentin  (NEURONTIN ) 100 MG capsule TAKE 2 CAPSULES(200 MG) BY MOUTH TWICE DAILY   hydrochlorothiazide  (MICROZIDE ) 12.5 MG capsule TAKE 1 CAPSULE(12.5 MG) BY MOUTH DAILY   ipratropium (ATROVENT ) 0.03 % nasal spray Place 2 sprays into both nostrils every 12 (twelve) hours.   losartan  (COZAAR ) 100 MG tablet TAKE 1 TABLET(100 MG) BY MOUTH DAILY   meloxicam  (MOBIC ) 7.5 MG tablet TAKE 1 TABLET(7.5 MG) BY MOUTH DAILY   tiZANidine  (ZANAFLEX ) 2 MG tablet Take 2 mg by mouth at bedtime.   valACYclovir  (VALTREX ) 1000 MG tablet TAKE 1 TABLET(1000 MG) BY MOUTH TWICE DAILY AS NEEDED   No facility-administered encounter medications on file as of 01/19/2024.    Allergies (verified) Codeine, Hydrocodone -acetaminophen , Loratadine-pseudoephedrine er, Statins, and Augmentin  [amoxicillin -pot clavulanate]   History: Past Medical History:  Diagnosis Date   ANXIETY 12/01/2006   Arthritis    Blepharospasm 12/01/2006   BONE SPUR 12/01/2006   Cellulitis and abscess of oral soft tissues 05/01/2008   Cervicalgia 08/24/2008   COLD SORE 05/30/2008   DEPRESSION 12/01/2006   EXCISION OF GANGLION CYST,  WRIST, HX OF 12/01/2006   GERD 12/01/2006   HYPERLIPIDEMIA 12/01/2006   HYPERTENSION, BORDERLINE 12/01/2006   LOW BACK PAIN 12/01/2006   MACULAR DEGENERATION 12/01/2006   OTITIS MEDIA, ACUTE, LEFT 05/30/2008   PARESTHESIA 08/24/2008   SINUSITIS- ACUTE-NOS 01/07/2010   Past Surgical History:  Procedure Laterality Date   ABDOMINAL HYSTERECTOMY     blepharospasm     bone spur     removal right foot   BREAST LUMPECTOMY     right   GANGLION CYST EXCISION     H/O Macular degeneration     Family  History  Problem Relation Age of Onset   Hypertension Mother    Pulmonary fibrosis Mother    Heart disease Father    Heart attack Father    Diabetes Daughter    Social History   Socioeconomic History   Marital status: Married    Spouse name: Not on file   Number of children: 1   Years of education: Not on file   Highest education level: 12th grade  Occupational History    Employer: [employer]  Tobacco Use   Smoking status: Every Day   Smokeless tobacco: Never  Vaping Use   Vaping status: Never Used  Substance and Sexual Activity   Alcohol use: No   Drug use: No   Sexual activity: Yes    Comment: 1st intercourse- 77, partners-  married- 30 yrs   Other Topics Concern   Not on file  Social History Narrative   Married   Current Smoker   Social Drivers of Health   Financial Resource Strain: Low Risk  (01/19/2024)   Overall Financial Resource Strain (CARDIA)    Difficulty of Paying Living Expenses: Not hard at all  Food Insecurity: No Food Insecurity (01/19/2024)   Hunger Vital Sign    Worried About Running Out of Food in the Last Year: Never true    Ran Out of Food in the Last Year: Never true  Transportation Needs: No Transportation Needs (01/19/2024)   PRAPARE - Administrator, Civil Service (Medical): No    Lack of Transportation (Non-Medical): No  Physical Activity: Insufficiently Active (01/19/2024)   Exercise Vital Sign    Days of Exercise per Week: 2 days    Minutes of Exercise per Session: 20 min  Stress: No Stress Concern Present (01/19/2024)   [employer] of Occupational Health - Occupational Stress Questionnaire    Feeling of Stress: Not at all  Social Connections: Moderately Integrated (01/19/2024)   Social Connection and Isolation Panel    Frequency of Communication with Friends and Family: Once a week    Frequency of Social Gatherings with Friends and Family: Once a week    Attends Religious Services: More than 4 times per year     Active Member of [employer] or Organizations: Yes    Attends Banker Meetings: 1 to 4 times per year    Marital Status: Married    Tobacco Counseling Ready to quit: No Counseling given: Yes    Clinical Intake:  Pre-visit preparation completed: Yes  Pain : No/denies pain     BMI - recorded: 21.72 Nutritional Status: BMI of 19-24  Normal Nutritional Risks: None Diabetes: No  Lab Results  Component Value Date   HGBA1C 5.8 08/03/2023   HGBA1C 5.7 02/02/2023   HGBA1C 6.0 08/01/2022     How often do you need to have someone help you when you read instructions, pamphlets, or other written materials from  your doctor or pharmacy?: 1 - Never  Interpreter Needed?: No  Information entered by :: Verdie Saba, CMA   Activities of Daily Living     01/19/2024    1:12 PM  In your present state of health, do you have any difficulty performing the following activities:  Hearing? 0  Vision? 0  Difficulty concentrating or making decisions? 0  Walking or climbing stairs? 0  Dressing or bathing? 0  Doing errands, shopping? 0  Preparing Food and eating ? N  Using the Toilet? N  In the past six months, have you accidently leaked urine? N  Do you have problems with loss of bowel control? N  Managing your Medications? N  Managing your Finances? N  Housekeeping or managing your Housekeeping? N    Patient Care Team: Norleen Lynwood ORN, MD as PCP - General Francella Fairy SAILOR, OD (Optometry)  I have updated your Care Teams any recent Medical Services you may have received from other providers in the past year.     Assessment:   This is a routine wellness examination for [place].  Hearing/Vision screen Hearing Screening - Comments:: Denies hearing difficulties   Vision Screening - Comments:: Wears rx glasses - up to date with routine eye exams with Fairy Francella   Goals Addressed               This Visit's Progress     Patient Stated (pt-stated)        Patient stated  she plans to continue to manage medications       Depression Screen     01/19/2024    1:13 PM 08/18/2023    3:17 PM 08/03/2023    9:33 AM 02/02/2023    9:52 AM 01/15/2023   11:39 AM 05/01/2022    2:00 PM 05/01/2022    1:25 PM  PHQ 2/9 Scores  PHQ - 2 Score 0 0 0 0 0 0 0  PHQ- 9 Score 0          Fall Risk     01/19/2024    1:12 PM 08/18/2023    3:28 PM 08/03/2023    9:36 AM 02/02/2023    9:52 AM 01/15/2023   11:44 AM  Fall Risk   Falls in the past year? 0 0 0 0 0  Number falls in past yr: 0 0 0 0 0  Injury with Fall? 0 0 0 0 0  Risk for fall due to : No Fall Risks No Fall Risks No Fall Risks No Fall Risks No Fall Risks  Follow up Falls evaluation completed;Falls prevention discussed Falls evaluation completed Falls evaluation completed Falls evaluation completed Falls prevention discussed    MEDICARE RISK AT HOME:  Medicare Risk at Home Any stairs in or around the home?: No If so, are there any without handrails?: No Home free of loose throw rugs in walkways, pet beds, electrical cords, etc?: Yes Adequate lighting in your home to reduce risk of falls?: Yes Life alert?: No Use of a cane, walker or w/c?: No Grab bars in the bathroom?: No Shower chair or bench in shower?: Yes Elevated toilet seat or a handicapped toilet?: Yes  TIMED UP AND GO:  Was the test performed?  No  Cognitive Function: 6CIT completed        01/19/2024    1:17 PM 01/15/2023   11:45 AM 01/31/2022    1:09 PM  6CIT Screen  What Year? 0 points 0 points 0 points  What month? 0  points 0 points 0 points  What time? 0 points 0 points 0 points  Count back from 20 0 points 0 points 0 points  Months in reverse 0 points 0 points 0 points  Repeat phrase 0 points 0 points 2 points  Total Score 0 points 0 points 2 points    Immunizations Immunization History  Administered Date(s) Administered   DTaP 04/04/2022   Fluad Quad(high Dose 65+) 05/07/2020, 06/06/2021   Fluad Trivalent(High Dose 65+) 02/02/2023    INFLUENZA, HIGH DOSE SEASONAL PF 01/19/2024   Influenza Nasal 01/19/2016   Influenza-Unspecified 01/26/2014, 02/02/2015, 03/08/2018   PNEUMOCOCCAL CONJUGATE-20 06/06/2021   Td 08/23/2009   Zoster Recombinant(Shingrix) 07/29/2021   Zoster, Unspecified 04/11/2022, 04/11/2022    Screening Tests Health Maintenance  Topic Date Due   DEXA SCAN  Never done   Mammogram  02/03/2019   Colonoscopy  08/02/2024 (Originally 09/02/1997)   Medicare Annual Wellness (AWV)  01/18/2025   DTaP/Tdap/Td (3 - Tdap) 04/04/2032   Pneumococcal Vaccine: 50+ Years  Completed   Influenza Vaccine  Completed   Hepatitis C Screening  Completed   Zoster Vaccines- Shingrix  Completed   HPV VACCINES  Aged Out   Meningococcal B Vaccine  Aged Out   COVID-19 Vaccine  Discontinued    Health Maintenance Items Addressed: Influenza vaccine given today  I have recommended that this patient have a Mammogram and DEXA Scan but she declines at this time. I have discussed the risks and benefits of this procedure with her. The patient verbalizes understanding.   Additional Screening:  Vision Screening: Recommended annual ophthalmology exams for early detection of glaucoma and other disorders of the eye. Is the patient up to date with their annual eye exam?  Yes  Who is the provider or what is the name of the office in which the patient attends annual eye exams? Dr Fairy Burnet  Dental Screening: Recommended annual dental exams for proper oral hygiene  Community Resource Referral / Chronic Care Management: CRR required this visit?  No   CCM required this visit?  No   Plan:    I have personally reviewed and noted the following in the patient's chart:   Medical and social history Use of alcohol, tobacco or illicit drugs  Current medications and supplements including opioid prescriptions. Patient is not currently taking opioid prescriptions. Functional ability and status Nutritional status Physical activity Advanced  directives List of other physicians Hospitalizations, surgeries, and ER visits in previous 12 months Vitals Screenings to include cognitive, depression, and falls Referrals and appointments  In addition, I have reviewed and discussed with patient certain preventive protocols, quality metrics, and best practice recommendations. A written personalized care plan for preventive services as well as general preventive health recommendations were provided to patient.   Verdie CHRISTELLA Saba, CMA   01/19/2024   After Visit Summary: (In Person-Declined) Patient declined AVS at this time.  Notes: Pt stated will call later to schedule a 6-mth f/u appt w/PCP.  Pt declined to have a Mammogram and a DEXA Scan.

## 2024-01-19 NOTE — Patient Instructions (Addendum)
 [name],  Thank you for taking the time for your Medicare Wellness Visit. I appreciate your continued commitment to your health goals. Please review the care plan we discussed, and feel free to reach out if I can assist you further.  Medicare recommends these wellness visits once per year to help you and your care team stay ahead of potential health issues. These visits are designed to focus on prevention, allowing your provider to concentrate on managing your acute and chronic conditions during your regular appointments.  Please note that Annual Wellness Visits do not include a physical exam. Some assessments may be limited, especially if the visit was conducted virtually. If needed, we may recommend a separate in-person follow-up with your provider.  Ongoing Care Seeing your primary care provider every 3 to 6 months helps us  monitor your health and provide consistent, personalized care.   Referrals If a referral was made during today's visit and you haven't received any updates within two weeks, please contact the referred provider directly to check on the status.  Recommended Screenings:  Health Maintenance  Topic Date Due   DEXA scan (bone density measurement)  Never done   Breast Cancer Screening  02/03/2019   Colon Cancer Screening  08/02/2024*   Medicare Annual Wellness Visit  01/18/2025   DTaP/Tdap/Td vaccine (3 - Tdap) 04/04/2032   Pneumococcal Vaccine for age over 70  Completed   Flu Shot  Completed   Hepatitis C Screening  Completed   Zoster (Shingles) Vaccine  Completed   HPV Vaccine  Aged Out   Meningitis B Vaccine  Aged Out   COVID-19 Vaccine  Discontinued  *Topic was postponed. The date shown is not the original due date.       01/15/2023   11:45 AM  Advanced Directives  Does Patient Have a Medical Advance Directive? No  Would patient like information on creating a medical advance directive? No - Patient declined   Advance Care Planning is important because  it: Ensures you receive medical care that aligns with your values, goals, and preferences. Provides guidance to your family and loved ones, reducing the emotional burden of decision-making during critical moments.  Vision: Annual vision screenings are recommended for early detection of glaucoma, cataracts, and diabetic retinopathy. These exams can also reveal signs of chronic conditions such as diabetes and high blood pressure.  Dental: Annual dental screenings help detect early signs of oral cancer, gum disease, and other conditions linked to overall health, including heart disease and diabetes.

## 2024-01-21 ENCOUNTER — Other Ambulatory Visit: Payer: Self-pay | Admitting: Family Medicine

## 2024-02-04 NOTE — Progress Notes (Unsigned)
 [name] [address] [place] [address] Phone: [phone] Subjective:   [name], am serving as a scribe for Dr. Arthea Claudene.  I'm seeing this patient by the request  of:  Norleen Lynwood ORN, MD  CC: Back and neck pain follow-up  YEP:[name]  [name] is a 73 y.o. female coming in with complaint of back and neck pain. OMT on 10/06/2023. Patient states that she drove to [facility] and her back was painful. Does see chiro as well. Using foam roller to pop rib back in.   Medications patient has been prescribed: meloxicam   Taking:         Reviewed prior external information including notes and imaging from previsou exam, outside providers and external EMR if available.   As well as notes that were available from care everywhere and other healthcare systems.  Past medical history, social, surgical and family history all reviewed in electronic medical record.  No pertanent information unless stated regarding to the chief complaint.   Past Medical History:  Diagnosis Date   ANXIETY 12/01/2006   Arthritis    Blepharospasm 12/01/2006   BONE SPUR 12/01/2006   Cellulitis and abscess of oral soft tissues 05/01/2008   Cervicalgia 08/24/2008   COLD SORE 05/30/2008   DEPRESSION 12/01/2006   EXCISION OF GANGLION CYST, WRIST, HX OF 12/01/2006   GERD 12/01/2006   HYPERLIPIDEMIA 12/01/2006   HYPERTENSION, BORDERLINE 12/01/2006   LOW BACK PAIN 12/01/2006   MACULAR DEGENERATION 12/01/2006   OTITIS MEDIA, ACUTE, LEFT 05/30/2008   PARESTHESIA 08/24/2008   SINUSITIS- ACUTE-NOS 01/07/2010    Allergies  Allergen Reactions   Codeine    Hydrocodone -Acetaminophen     Loratadine-Pseudoephedrine Er    Statins Other (See Comments)    Joint and muscle cramp   Augmentin  [Amoxicillin -Pot Clavulanate] Diarrhea and Nausea And Vomiting     Review of Systems:  No headache, visual changes, nausea, vomiting, diarrhea, constipation, dizziness, abdominal pain, skin rash, fevers,  chills, night sweats, weight loss, swollen lymph nodes, joint swelling, chest pain, shortness of breath, mood changes. POSITIVE muscle aches, body aches  Objective  Blood pressure 110/62, pulse 75, height 5' 3 (1.6 m), weight 123 lb (55.8 kg), SpO2 98%.   General: No apparent distress alert and oriented x3 mood and affect normal, dressed appropriately.  HEENT: Pupils equal, extraocular movements intact  Respiratory: Patient's speak in full sentences and does not appear short of breath  Cardiovascular: No lower extremity edema, non tender, no erythema  Gait relatively unremarkable. MSK:  Back tightness noted, seems to be out of proportion to the amount of palpation.  Osteopathic findings  C3 flexed rotated and side bent right C6 flexed rotated and side bent left T3 extended rotated and side bent right inhaled rib T8 extended rotated and side bent right inhaled rib L2 flexed rotated and side bent right Sacrum right on right       Assessment and Plan:  Slipped rib syndrome Slipped rib syndrome noted, discussed icing regimen, we discussed with patient about stability exercises, discussed which activities to do and which ones to avoid.  Increase activity as well.  Follow-up again in 6 to 8 weeks.    Nonallopathic problems  Decision today to treat with OMT was based on Physical Exam  After verbal consent patient was treated with HVLA, ME, FPR techniques in cervical, rib, thoracic, lumbar, and sacral  areas  Patient tolerated the procedure well with improvement in symptoms  Patient given exercises,  stretches and lifestyle modifications  See medications in patient instructions if given  Patient will follow up in 4-8 weeks      The above documentation has been reviewed and is accurate and complete [name] M Davier Tramell, DO        Note: This dictation was prepared with Dragon dictation along with smaller phrase technology. Any transcriptional errors that result from this  process are unintentional.

## 2024-02-05 ENCOUNTER — Encounter: Payer: Self-pay | Admitting: Family Medicine

## 2024-02-05 ENCOUNTER — Other Ambulatory Visit: Payer: Self-pay | Admitting: Family Medicine

## 2024-02-08 ENCOUNTER — Other Ambulatory Visit: Payer: Self-pay

## 2024-02-08 MED ORDER — GABAPENTIN 100 MG PO CAPS: 200.0000 mg | ORAL_CAPSULE | Freq: Two times a day (BID) | ORAL | 0 refills | Status: DC

## 2024-02-09 ENCOUNTER — Ambulatory Visit: Admitting: Family Medicine

## 2024-02-09 ENCOUNTER — Encounter: Payer: Self-pay | Admitting: Family Medicine

## 2024-02-09 VITALS — BP 110/62 | HR 75 | Ht 63.0 in | Wt 123.0 lb

## 2024-02-09 DIAGNOSIS — M940 Chondrocostal junction syndrome [Tietze]: Secondary | ICD-10-CM | POA: Insufficient documentation

## 2024-02-09 DIAGNOSIS — M9903 Segmental and somatic dysfunction of lumbar region: Secondary | ICD-10-CM

## 2024-02-09 DIAGNOSIS — M9908 Segmental and somatic dysfunction of rib cage: Secondary | ICD-10-CM

## 2024-02-09 DIAGNOSIS — M9902 Segmental and somatic dysfunction of thoracic region: Secondary | ICD-10-CM

## 2024-02-09 DIAGNOSIS — M9901 Segmental and somatic dysfunction of cervical region: Secondary | ICD-10-CM | POA: Diagnosis not present

## 2024-02-09 DIAGNOSIS — M9904 Segmental and somatic dysfunction of sacral region: Secondary | ICD-10-CM | POA: Diagnosis not present

## 2024-02-09 NOTE — Assessment & Plan Note (Signed)
 Slipped rib syndrome noted, discussed icing regimen, we discussed with patient about stability exercises, discussed which activities to do and which ones to avoid.  Increase activity as well.  Follow-up again in 6 to 8 weeks.

## 2024-02-09 NOTE — Patient Instructions (Signed)
 Your cocktail is good idea when needed See me in 2-3 months

## 2024-02-11 ENCOUNTER — Encounter: Payer: Self-pay | Admitting: Family Medicine

## 2024-02-12 ENCOUNTER — Other Ambulatory Visit: Payer: Self-pay

## 2024-02-12 MED ORDER — GABAPENTIN 100 MG PO CAPS: 200.0000 mg | ORAL_CAPSULE | Freq: Three times a day (TID) | ORAL | 0 refills | Status: AC

## 2024-02-24 DIAGNOSIS — H353133 Nonexudative age-related macular degeneration, bilateral, advanced atrophic without subfoveal involvement: Secondary | ICD-10-CM | POA: Diagnosis not present

## 2024-02-24 DIAGNOSIS — H43813 Vitreous degeneration, bilateral: Secondary | ICD-10-CM | POA: Diagnosis not present

## 2024-02-24 DIAGNOSIS — H2513 Age-related nuclear cataract, bilateral: Secondary | ICD-10-CM | POA: Diagnosis not present

## 2024-02-24 DIAGNOSIS — H35033 Hypertensive retinopathy, bilateral: Secondary | ICD-10-CM | POA: Diagnosis not present

## 2024-02-29 ENCOUNTER — Other Ambulatory Visit: Payer: Self-pay | Admitting: Internal Medicine

## 2024-02-29 ENCOUNTER — Other Ambulatory Visit: Payer: Self-pay

## 2024-03-01 ENCOUNTER — Telehealth: Admitting: Family Medicine

## 2024-03-01 ENCOUNTER — Encounter: Payer: Self-pay | Admitting: Family Medicine

## 2024-03-01 DIAGNOSIS — Q999 Chromosomal abnormality, unspecified: Secondary | ICD-10-CM

## 2024-03-01 NOTE — Progress Notes (Signed)
 [place]   Will be calling PCP

## 2024-03-02 ENCOUNTER — Other Ambulatory Visit: Payer: Self-pay | Admitting: Family Medicine

## 2024-03-09 ENCOUNTER — Ambulatory Visit: Payer: Self-pay | Admitting: Internal Medicine

## 2024-03-09 ENCOUNTER — Ambulatory Visit (INDEPENDENT_AMBULATORY_CARE_PROVIDER_SITE_OTHER)

## 2024-03-09 ENCOUNTER — Ambulatory Visit: Admitting: Internal Medicine

## 2024-03-09 VITALS — HR 66 | Temp 97.7°F | Ht 63.0 in | Wt 124.4 lb

## 2024-03-09 DIAGNOSIS — I10 Essential (primary) hypertension: Secondary | ICD-10-CM

## 2024-03-09 DIAGNOSIS — N1831 Chronic kidney disease, stage 3a: Secondary | ICD-10-CM

## 2024-03-09 DIAGNOSIS — E559 Vitamin D deficiency, unspecified: Secondary | ICD-10-CM

## 2024-03-09 DIAGNOSIS — F17200 Nicotine dependence, unspecified, uncomplicated: Secondary | ICD-10-CM

## 2024-03-09 DIAGNOSIS — R058 Other specified cough: Secondary | ICD-10-CM

## 2024-03-09 MED ORDER — BENZONATATE 100 MG PO CAPS: 100.0000 mg | ORAL_CAPSULE | Freq: Three times a day (TID) | ORAL | 0 refills | Status: DC | PRN

## 2024-03-09 MED ORDER — AZITHROMYCIN 250 MG PO TABS: ORAL_TABLET | ORAL | 1 refills | Status: AC

## 2024-03-09 NOTE — Patient Instructions (Signed)
 Please take all new medication as prescribed- the antibiotic, and cough pills as needed  Please continue all other medications as before, and refills have been done if requested.  Please have the pharmacy call with any other refills you may need.  Please keep your appointments with your specialists as you may have planned  Please go to the XRAY Department in the first floor for the x-ray testing  You will be contacted by phone if any changes need to be made immediately.  Otherwise, you will receive a letter about your results with an explanation, but please check with MyChart first.

## 2024-03-09 NOTE — Progress Notes (Unsigned)
 Patient ID: [name], female   DOB: [birth year], 73 y.o.   MRN: [id number]        Chief Complaint: follow up prod cough, low vit d, smoker, htn, ckd 3a       HPI:  [name] is a 73 y.o. female Here with acute onset mild to mod 2-3 days ST, HA, general weakness and malaise, with prod cough greenish sputum, but Pt denies chest pain, increased sob or doe, wheezing, orthopnea, PND, increased LE swelling, palpitations, dizziness or syncope.   Pt denies polydipsia, polyuria, or new focal neuro s/s.    Pt denies fever, wt loss, night sweats, loss of appetite, or other constitutional symptoms         Wt Readings from Last 3 Encounters:  03/09/24 124 lb 6 oz (56.4 kg)  02/09/24 123 lb (55.8 kg)  01/19/24 122 lb 9.6 oz (55.6 kg)   BP Readings from Last 3 Encounters:  02/09/24 110/62  01/19/24 137/66  10/06/23 118/62         Past Medical History:  Diagnosis Date   ANXIETY 12/01/2006   Arthritis    Blepharospasm 12/01/2006   BONE SPUR 12/01/2006   Cellulitis and abscess of oral soft tissues 05/01/2008   Cervicalgia 08/24/2008   COLD SORE 05/30/2008   DEPRESSION 12/01/2006   EXCISION OF GANGLION CYST, WRIST, HX OF 12/01/2006   GERD 12/01/2006   HYPERLIPIDEMIA 12/01/2006   HYPERTENSION, BORDERLINE 12/01/2006   LOW BACK PAIN 12/01/2006   MACULAR DEGENERATION 12/01/2006   OTITIS MEDIA, ACUTE, LEFT 05/30/2008   PARESTHESIA 08/24/2008   SINUSITIS- ACUTE-NOS 01/07/2010   Past Surgical History:  Procedure Laterality Date   ABDOMINAL HYSTERECTOMY     blepharospasm     bone spur     removal right foot   BREAST LUMPECTOMY     right   GANGLION CYST EXCISION     H/O Macular degeneration      reports that she has been smoking. She has never used smokeless tobacco. She reports that she does not drink alcohol and does not use drugs. family history includes Diabetes in her daughter; Heart attack in her father; Heart disease in her father; Hypertension in her mother; Pulmonary fibrosis in her mother. Allergies   Allergen Reactions   Codeine    Hydrocodone -Acetaminophen     Loratadine-Pseudoephedrine Er    Statins Other (See Comments)    Joint and muscle cramp   Augmentin  [Amoxicillin -Pot Clavulanate] Diarrhea and Nausea And Vomiting   Current Outpatient Medications on File Prior to Visit  Medication Sig Dispense Refill   amLODipine  (NORVASC ) 5 MG tablet TAKE 1 TABLET(5 MG) BY MOUTH DAILY 90 tablet 3   aspirin 81 MG tablet Take 81 mg by mouth daily.     butalbital -acetaminophen -caffeine  (FIORICET) 50-325-40 MG tablet TAKE 1 TABLET BY MOUTH DAILY AS NEEDED FOR HEADACHE 14 tablet 2   clonazePAM  (KLONOPIN ) 1 MG tablet TAKE 1/2 TO 1 TABLET BY MOUTH TWICE DAILY AS NEEDED 60 tablet 2   cyclobenzaprine  (FLEXERIL ) 5 MG tablet TAKE 1 TABLET(5 MG) BY MOUTH AT BEDTIME AS NEEDED FOR MUSCLE SPASMS 30 tablet 0   doxycycline  (VIBRA -TABS) 100 MG tablet Take 1 tablet (100 mg total) by mouth 2 (two) times daily. 20 tablet 0   Evolocumab  (REPATHA  SURECLICK) 140 MG/ML SOAJ ADMINISTER 1 INJECTION UNDER THE SKIN EVERY 2 WEEKS 2 mL 11   famotidine  (PEPCID ) 20 MG tablet TAKE 1 TABLET(20 MG) BY MOUTH TWICE DAILY 180 tablet 1   fluconazole  (DIFLUCAN ) 150  MG tablet 1 tab by mouth every 3 days as needed 2 tablet 1   FLUoxetine  (PROZAC ) 40 MG capsule Take 40 mg by mouth daily.     gabapentin  (NEURONTIN ) 100 MG capsule Take 2 capsules (200 mg total) by mouth 3 (three) times daily. 540 capsule 0   hydrochlorothiazide  (MICROZIDE ) 12.5 MG capsule TAKE 1 CAPSULE(12.5 MG) BY MOUTH DAILY 90 capsule 3   ipratropium (ATROVENT ) 0.03 % nasal spray Place 2 sprays into both nostrils every 12 (twelve) hours. 30 mL 0   losartan  (COZAAR ) 100 MG tablet TAKE 1 TABLET(100 MG) BY MOUTH DAILY 90 tablet 3   meloxicam  (MOBIC ) 7.5 MG tablet TAKE 1 TABLET(7.5 MG) BY MOUTH DAILY 30 tablet 0   tiZANidine  (ZANAFLEX ) 2 MG tablet Take 2 mg by mouth at bedtime.     valACYclovir  (VALTREX ) 1000 MG tablet TAKE 1 TABLET(1000 MG) BY MOUTH TWICE DAILY AS NEEDED  60 tablet 0   No current facility-administered medications on file prior to visit.        ROS:  All others reviewed and negative.  Objective        PE:  Pulse 66   Temp 97.7 F (36.5 C) (Temporal)   Ht 5' 3 (1.6 m)   Wt 124 lb 6 oz (56.4 kg)   SpO2 96%   BMI 22.03 kg/m                 Constitutional: Pt appears mild ill               HENT: Head: NCAT.                Right Ear: External ear normal.                 Left Ear: External ear normal. Bilat tm's with mild erythema.  Max sinus areas non tender.  Pharynx with mild erythema, no exudate               Eyes: . Pupils are equal, round, and reactive to light. Conjunctivae and EOM are normal               Nose: without d/c or deformity               Neck: Neck supple. Gross normal ROM               Cardiovascular: Normal rate and regular rhythm.                 Pulmonary/Chest: Effort normal and breath sounds without rales or wheezing.                               Neurological: Pt is alert. At baseline orientation, motor grossly intact               Skin: Skin is warm. No rashes, no other new lesions, LE edema - none               Psychiatric: Pt behavior is normal without agitation   Micro: none  Cardiac tracings I have personally interpreted today:  none  Pertinent Radiological findings (summarize): none   Lab Results  Component Value Date   WBC 15.7 (H) 08/03/2023   HGB 13.3 08/03/2023   HCT 39.7 08/03/2023   PLT 334.0 08/03/2023   GLUCOSE 78 08/03/2023   CHOL 191 08/03/2023   TRIG 281.0 (H) 08/03/2023   HDL 58.50  08/03/2023   LDLDIRECT 106.0 06/06/2021   LDLCALC 77 08/03/2023   ALT 9 08/03/2023   AST 13 08/03/2023   NA 137 08/03/2023   K 4.9 08/03/2023   CL 100 08/03/2023   CREATININE 1.50 (H) 08/03/2023   BUN 23 08/03/2023   CO2 28 08/03/2023   TSH 2.94 08/03/2023   HGBA1C 5.8 08/03/2023   MICROALBUR 1.5 08/03/2023   Assessment/Plan:  [name] is a 73 y.o. White or Caucasian [1] female with   has a past medical history of ANXIETY (12/01/2006), Arthritis, Blepharospasm (12/01/2006), BONE SPUR (12/01/2006), Cellulitis and abscess of oral soft tissues (05/01/2008), Cervicalgia (08/24/2008), COLD SORE (05/30/2008), DEPRESSION (12/01/2006), EXCISION OF GANGLION CYST, WRIST, HX OF (12/01/2006), GERD (12/01/2006), HYPERLIPIDEMIA (12/01/2006), HYPERTENSION, BORDERLINE (12/01/2006), LOW BACK PAIN (12/01/2006), MACULAR DEGENERATION (12/01/2006), OTITIS MEDIA, ACUTE, LEFT (05/30/2008), PARESTHESIA (08/24/2008), and SINUSITIS- ACUTE-NOS (01/07/2010).  Vitamin D  deficiency Last vitamin D  Lab Results  Component Value Date   VD25OH 42.09 08/03/2023   Stable, cont oral replacement   Smoker Pt counseled to quit, pt not ready  Essential hypertension BP Readings from Last 3 Encounters:  02/09/24 110/62  01/19/24 137/66  10/06/23 118/62   Stable, pt to continue medical treatment norvasc  4 every day, hct 12.5 every day, losartan  100 qd   CKD (chronic kidney disease) stage 3, GFR 30-59 ml/min (HCC) Lab Results  Component Value Date   CREATININE 1.50 (H) 08/03/2023   Stable overall, cont to avoid nephrotoxins Ckd 3a  Productive cough Mild to mod, for cxr r'o pna, for antibx course zpack and tessalon  perle prn,  to f/u any worsening symptoms or concerns  Followup: Return if symptoms worsen or fail to improve.  Lynwood Rush, MD 03/11/2024 8:16 PM [facility]

## 2024-03-11 ENCOUNTER — Encounter: Payer: Self-pay | Admitting: Internal Medicine

## 2024-03-11 NOTE — Assessment & Plan Note (Signed)
 Last vitamin D Lab Results  Component Value Date   VD25OH 42.09 08/03/2023   Stable, cont oral replacement

## 2024-03-11 NOTE — Assessment & Plan Note (Signed)
 Lab Results  Component Value Date   CREATININE 1.50 (H) 08/03/2023   Stable overall, cont to avoid nephrotoxins Ckd 3a

## 2024-03-11 NOTE — Assessment & Plan Note (Signed)
 Mild to mod, for cxr r'o pna, for antibx course zpack and tessalon  perle prn,  to f/u any worsening symptoms or concerns

## 2024-03-11 NOTE — Assessment & Plan Note (Signed)
 Pt counseled to quit, pt not ready

## 2024-03-11 NOTE — Assessment & Plan Note (Signed)
 BP Readings from Last 3 Encounters:  02/09/24 110/62  01/19/24 137/66  10/06/23 118/62   Stable, pt to continue medical treatment norvasc  4 every day, hct 12.5 every day, losartan  100 qd

## 2024-03-18 ENCOUNTER — Encounter: Payer: Self-pay | Admitting: Family Medicine

## 2024-03-19 ENCOUNTER — Other Ambulatory Visit: Payer: Self-pay | Admitting: Family Medicine

## 2024-04-07 ENCOUNTER — Encounter: Payer: Self-pay | Admitting: Family Medicine

## 2024-04-07 NOTE — Progress Notes (Deleted)
°  [facility] [address] [place] [address] Phone: [phone] Subjective:    I'm seeing this patient by the request  of:  Norleen Lynwood ORN, MD  CC:   YEP:[name]  [name] is a 73 y.o. female coming in with complaint of back and neck pain. OMT 02/09/2024. Patient states   Medications patient has been prescribed: Gabapentin , Flexeril , Meloxicam   Taking:         Reviewed prior external information including notes and imaging from previsou exam, outside providers and external EMR if available.   As well as notes that were available from care everywhere and other healthcare systems.  Past medical history, social, surgical and family history all reviewed in electronic medical record.  No pertanent information unless stated regarding to the chief complaint.   Past Medical History:  Diagnosis Date   ANXIETY 12/01/2006   Arthritis    Blepharospasm 12/01/2006   BONE SPUR 12/01/2006   Cellulitis and abscess of oral soft tissues 05/01/2008   Cervicalgia 08/24/2008   COLD SORE 05/30/2008   DEPRESSION 12/01/2006   EXCISION OF GANGLION CYST, WRIST, HX OF 12/01/2006   GERD 12/01/2006   HYPERLIPIDEMIA 12/01/2006   HYPERTENSION, BORDERLINE 12/01/2006   LOW BACK PAIN 12/01/2006   MACULAR DEGENERATION 12/01/2006   OTITIS MEDIA, ACUTE, LEFT 05/30/2008   PARESTHESIA 08/24/2008   SINUSITIS- ACUTE-NOS 01/07/2010    Allergies[1]   Review of Systems:  No headache, visual changes, nausea, vomiting, diarrhea, constipation, dizziness, abdominal pain, skin rash, fevers, chills, night sweats, weight loss, swollen lymph nodes, body aches, joint swelling, chest pain, shortness of breath, mood changes. POSITIVE muscle aches  Objective  There were no vitals taken for this visit.   General: No apparent distress alert and oriented x3 mood and affect normal, dressed appropriately.  HEENT: Pupils equal, extraocular movements intact  Respiratory: Patient's speak in full sentences  and does not appear short of breath  Cardiovascular: No lower extremity edema, non tender, no erythema  Gait MSK:  Back   Osteopathic findings  C2 flexed rotated and side bent right C6 flexed rotated and side bent left T3 extended rotated and side bent right inhaled rib T9 extended rotated and side bent left L2 flexed rotated and side bent right Sacrum right on right       Assessment and Plan:  No problem-specific Assessment & Plan notes found for this encounter.    Nonallopathic problems  Decision today to treat with OMT was based on Physical Exam  After verbal consent patient was treated with HVLA, ME, FPR techniques in cervical, rib, thoracic, lumbar, and sacral  areas  Patient tolerated the procedure well with improvement in symptoms  Patient given exercises, stretches and lifestyle modifications  See medications in patient instructions if given  Patient will follow up in 4-8 weeks             Note: This dictation was prepared with Dragon dictation along with smaller phrase technology. Any transcriptional errors that result from this process are unintentional.            [1]  Allergies Allergen Reactions   Codeine    Hydrocodone -Acetaminophen     Loratadine-Pseudoephedrine Er    Statins Other (See Comments)    Joint and muscle cramp   Augmentin  [Amoxicillin -Pot Clavulanate] Diarrhea and Nausea And Vomiting

## 2024-04-08 ENCOUNTER — Other Ambulatory Visit: Payer: Self-pay | Admitting: Internal Medicine

## 2024-04-08 ENCOUNTER — Encounter: Admitting: Internal Medicine

## 2024-04-08 ENCOUNTER — Ambulatory Visit: Admitting: Internal Medicine

## 2024-04-08 MED ORDER — FAMOTIDINE 20 MG PO TABS: 20.0000 mg | ORAL_TABLET | Freq: Two times a day (BID) | ORAL | 1 refills | Status: AC

## 2024-04-08 MED ORDER — CLONAZEPAM 1 MG PO TABS: ORAL_TABLET | ORAL | 2 refills | Status: AC

## 2024-04-08 MED ORDER — REPATHA SURECLICK 140 MG/ML ~~LOC~~ SOAJ: SUBCUTANEOUS | 3 refills | Status: AC

## 2024-04-08 MED ORDER — HYDROCHLOROTHIAZIDE 12.5 MG PO CAPS: 12.5000 mg | ORAL_CAPSULE | Freq: Every day | ORAL | 3 refills | Status: AC

## 2024-04-08 MED ORDER — VALACYCLOVIR HCL 1 G PO TABS: 1000.0000 mg | ORAL_TABLET | Freq: Two times a day (BID) | ORAL | 2 refills | Status: AC

## 2024-04-08 MED ORDER — LOSARTAN POTASSIUM 100 MG PO TABS: 100.0000 mg | ORAL_TABLET | Freq: Every day | ORAL | 3 refills | Status: AC

## 2024-04-08 MED ORDER — AMLODIPINE BESYLATE 5 MG PO TABS: 5.0000 mg | ORAL_TABLET | Freq: Every day | ORAL | 3 refills | Status: AC

## 2024-04-11 ENCOUNTER — Ambulatory Visit: Admitting: Family Medicine

## 2024-04-16 ENCOUNTER — Encounter: Payer: Self-pay | Admitting: Internal Medicine

## 2024-04-18 NOTE — Telephone Encounter (Signed)
 No, I am not adding additional patients at this time

## 2024-04-18 NOTE — Telephone Encounter (Signed)
 Transfer ok with me, thanks

## 2024-04-26 ENCOUNTER — Encounter: Payer: Self-pay | Admitting: Family Medicine

## 2024-04-26 ENCOUNTER — Ambulatory Visit: Admitting: Family Medicine

## 2024-04-26 VITALS — BP 122/62 | HR 63 | Temp 97.9°F | Ht 63.0 in | Wt 123.0 lb

## 2024-04-26 DIAGNOSIS — J208 Acute bronchitis due to other specified organisms: Secondary | ICD-10-CM

## 2024-04-26 MED ORDER — SPACER/AERO-HOLDING CHAMBERS DEVI: 1.0000 | 0 refills | Status: AC | PRN

## 2024-04-26 MED ORDER — ALBUTEROL SULFATE HFA 108 (90 BASE) MCG/ACT IN AERS: 2.0000 | INHALATION_SPRAY | Freq: Four times a day (QID) | RESPIRATORY_TRACT | 0 refills | Status: AC | PRN

## 2024-04-26 NOTE — Patient Instructions (Addendum)
 I have sent in an albuterol inhaler for you to use 2 puffs every 4 hours as needed for wheezing.  I have sent in a spacer for you to use with the inhaler.   Continue current medication regimen.   Follow up with specialists as scheduled.   Follow-up with me in 6 mos for medication management, sooner if needed.

## 2024-04-26 NOTE — Progress Notes (Signed)
 "  Acute Office Visit  Subjective:     Patient ID: [name], female    DOB: 05-02-1950, 73 y.o.   MRN: [id number]  No chief complaint on file.   HPI  Discussed the use of AI scribe software for clinical note transcription with the patient, who gave verbal consent to proceed.  History of Present Illness [name] is a 73 year old female who presents with a persistent cough and respiratory symptoms.  Cough and throat sensation - Persistent, non-productive cough that is intermittent - Sensation of something in the throat that cannot be cleared - Symptoms worsen in cold, dry air - No sputum production  Associated respiratory symptoms - No fever - No other systemic symptoms  Exposure history - Lives with granddaughter and three great-grandsons who frequently bring home illnesses from daycare and school - Daughter and granddaughter have had similar symptoms  Prior treatments - Never used inhalers - No inhalers prescribed for current episode     ROS Per HPI      Objective:    BP 122/62 (BP Location: Left Arm, Patient Position: Sitting)   Pulse 63   Temp 97.9 F (36.6 C) (Temporal)   Ht 5' 3 (1.6 m)   Wt 123 lb (55.8 kg)   SpO2 96%   BMI 21.79 kg/m    Physical Exam Vitals and nursing note reviewed.  Constitutional:      General: She is not in acute distress.    Appearance: Normal appearance. She is normal weight.  HENT:     Head: Normocephalic and atraumatic.     Right Ear: External ear normal.     Left Ear: External ear normal.     Nose: Nose normal.     Mouth/Throat:     Mouth: Mucous membranes are moist.     Pharynx: Oropharynx is clear.  Eyes:     Extraocular Movements: Extraocular movements intact.     Pupils: Pupils are equal, round, and reactive to light.  Cardiovascular:     Rate and Rhythm: Normal rate and regular rhythm.     Pulses: Normal pulses.     Heart sounds: Normal heart sounds.  Pulmonary:     Effort: Pulmonary effort is  normal. No respiratory distress.     Breath sounds: Normal breath sounds. No wheezing, rhonchi or rales.     Comments: Dry cough Musculoskeletal:        General: Normal range of motion.     Cervical back: Normal range of motion.     Right lower leg: No edema.     Left lower leg: No edema.  Lymphadenopathy:     Cervical: No cervical adenopathy.  Neurological:     General: No focal deficit present.     Mental Status: She is alert and oriented to person, place, and time.  Psychiatric:        Mood and Affect: Mood normal.        Thought Content: Thought content normal.     No results found for any visits on 04/26/24.      Assessment & Plan:   Assessment and Plan Assessment & Plan Acute bronchitis Intermittent non-productive cough exacerbated by dry air. - Prescribed inhaler with spacer, 1-2 puffs every 4 hours as needed. - Advised to report if symptoms do not improve for further evaluation.     No orders of the defined types were placed in this encounter.    Meds ordered this encounter  Medications  albuterol (VENTOLIN HFA) 108 (90 Base) MCG/ACT inhaler    Sig: Inhale 2 puffs into the lungs every 6 (six) hours as needed for wheezing or shortness of breath.    Dispense:  8 g    Refill:  0   Spacer/Aero-Holding Chambers DEVI    Sig: 1 Device by Does not apply route every 4 (four) hours as needed.    Dispense:  1 each    Refill:  0    Return in about 6 months (around 10/25/2024) for meds OV.  Corean LITTIE Ku, FNP  "

## 2024-04-27 ENCOUNTER — Other Ambulatory Visit: Payer: Self-pay

## 2024-04-27 ENCOUNTER — Other Ambulatory Visit: Payer: Self-pay | Admitting: Family Medicine

## 2024-04-27 MED ORDER — MELOXICAM 7.5 MG PO TABS: ORAL_TABLET | ORAL | 0 refills | Status: AC

## 2024-05-03 ENCOUNTER — Telehealth: Payer: Self-pay

## 2024-05-03 NOTE — Telephone Encounter (Signed)
 Copied from CRM [id number]. Topic: Clinical - Medication Question >> May 02, 2024  4:06 PM Amy B wrote: Reason for CRM: Patient requests assistance with getting Evolocumab  (REPATHA  SURECLICK) 140 MG/ML SOAJ.  It is over $400 and would like to be placed in a program for medication assistance.  Please contact patient [phone].

## 2024-05-04 ENCOUNTER — Telehealth: Payer: Self-pay | Admitting: Family Medicine

## 2024-05-04 ENCOUNTER — Other Ambulatory Visit: Payer: Self-pay

## 2024-05-04 DIAGNOSIS — E782 Mixed hyperlipidemia: Secondary | ICD-10-CM

## 2024-05-04 NOTE — Telephone Encounter (Signed)
 Noted, referral to [facility]

## 2024-05-04 NOTE — Telephone Encounter (Signed)
Referral placed, patient made aware

## 2024-05-04 NOTE — Progress Notes (Signed)
 Care Guide Pharmacy Note  05/04/2024 Name: [name] MRN: [id number] DOB: [birth year]  Referred By: Alvia Corean CROME, FNP Reason for referral: Complex Care Management (Outreach to schedule referral w/ pharm. )   [name] is a 74 y.o. year old female who is a primary care patient of Alvia Corean CROME, FNP.  [name] was referred to the pharmacist for assistance related to: HLD  Successful contact was made with the patient to discuss pharmacy services including being ready for the pharmacist to call at least 5 minutes before the scheduled appointment time and to have medication bottles and any blood pressure readings ready for review. The patient agreed to meet with the pharmacist via telephone visit on 05/12/2024.  Doyce Razor [facility], [facility] Guide Direct Dial:   Fax: [phone]

## 2024-05-12 ENCOUNTER — Other Ambulatory Visit: Admitting: Pharmacist

## 2024-05-12 DIAGNOSIS — E782 Mixed hyperlipidemia: Secondary | ICD-10-CM

## 2024-05-12 NOTE — Patient Instructions (Signed)
 Show this information to the pharmacy to use for your Repatha  prescription:   Card No.: [id number] BIN: [id number] PCN: [id number] PC Group: [id number]   Feel free to call with questions or concerns.   Darrelyn Drum, PharmD, BCPS, CPP Clinical Pharmacist Practitioner [place] at [facility] [phone]

## 2024-05-12 NOTE — Progress Notes (Signed)
" ° °  05/12/2024 Name: [name] MRN: [id number] DOB: Sep 14, 1950   Pt referred to me due to inability to get Repatha . Cost was $400 when she tried to get it refilled, likely due to a drug deductible.   Was able to get patient re-enrolled in [employer] foundation grant for Repatha .  [employer] ID: [id number]   START DATE 04/12/2024   END DATE 04/11/2025   GRANT AMOUNT $ 2500  Provided patient with information to give to pharmacy for copay assistance.  Darrelyn Drum, PharmD, BCPS Clinical Pharmacist [place] [facility] at [facility] [phone]  "

## 2024-05-16 ENCOUNTER — Telehealth: Payer: Self-pay | Admitting: Pharmacist

## 2024-05-16 NOTE — Telephone Encounter (Signed)
 Pt called regarding Repatha  grant - she tried to use it yesterday and the pharmacy told her the new card was expired. Checked the status on [employer] Well and the diagnosis verification is still under review. Pt takes her last injection this Wednesday 05/18/24 therefore she still has 2 weeks until needing her next refill. I advised pt I will check back on the verification status and contact the pharmacy once it has been fully approved.  Darrelyn Drum, PharmD, BCPS, CPP Clinical Pharmacist Practitioner [address] [facility] at [facility] [phone]

## 2024-05-19 NOTE — Progress Notes (Unsigned)
" °  [name] [address] [place] [address] Phone: [phone] Subjective:    I'm seeing this patient by the request  of:  Alvia Corean CROME, FNP  CC:   YEP:[name]  [name] is a 74 y.o. female coming in with complaint of back and neck pain. OMT 02/09/2024. Patient states   Medications patient has been prescribed: Flexeril , Gabapentin , Meloxicam    Taking:         Reviewed prior external information including notes and imaging from previsou exam, outside providers and external EMR if available.   As well as notes that were available from care everywhere and other healthcare systems.  Past medical history, social, surgical and family history all reviewed in electronic medical record.  No pertanent information unless stated regarding to the chief complaint.   Past Medical History:  Diagnosis Date   Allergy    Since childhood   ANXIETY 12/01/2006   Arthritis    Blepharospasm 12/01/2006   BONE SPUR 12/01/2006   Cellulitis and abscess of oral soft tissues 05/01/2008   Cervicalgia 08/24/2008   COLD SORE 05/30/2008   DEPRESSION 12/01/2006   EXCISION OF GANGLION CYST, WRIST, HX OF 12/01/2006   GERD 12/01/2006   HYPERLIPIDEMIA 12/01/2006   HYPERTENSION, BORDERLINE 12/01/2006   LOW BACK PAIN 12/01/2006   MACULAR DEGENERATION 12/01/2006   OTITIS MEDIA, ACUTE, LEFT 05/30/2008   PARESTHESIA 08/24/2008   SINUSITIS- ACUTE-NOS 01/07/2010    Allergies[1]   Review of Systems:  No headache, visual changes, nausea, vomiting, diarrhea, constipation, dizziness, abdominal pain, skin rash, fevers, chills, night sweats, weight loss, swollen lymph nodes, body aches, joint swelling, chest pain, shortness of breath, mood changes. POSITIVE muscle aches  Objective  There were no vitals taken for this visit.   General: No apparent distress alert and oriented x3 mood and affect normal, dressed appropriately.  HEENT: Pupils equal,  extraocular movements intact  Respiratory: Patient's speak in full sentences and does not appear short of breath  Cardiovascular: No lower extremity edema, non tender, no erythema  Gait MSK:  Back   Osteopathic findings  C2 flexed rotated and side bent right C6 flexed rotated and side bent left T3 extended rotated and side bent right inhaled rib T9 extended rotated and side bent left L2 flexed rotated and side bent right Sacrum right on right       Assessment and Plan:  No problem-specific Assessment & Plan notes found for this encounter.    Nonallopathic problems  Decision today to treat with OMT was based on Physical Exam  After verbal consent patient was treated with HVLA, ME, FPR techniques in cervical, rib, thoracic, lumbar, and sacral  areas  Patient tolerated the procedure well with improvement in symptoms  Patient given exercises, stretches and lifestyle modifications  See medications in patient instructions if given  Patient will follow up in 4-8 weeks             Note: This dictation was prepared with Dragon dictation along with smaller phrase technology. Any transcriptional errors that result from this process are unintentional.            [1]  Allergies Allergen Reactions   Codeine    Hydrocodone -Acetaminophen     Loratadine-Pseudoephedrine Er    Statins Other (See Comments)    Joint and muscle cramp   Augmentin  [Amoxicillin -Pot Clavulanate] Diarrhea and Nausea And Vomiting   "

## 2024-05-24 ENCOUNTER — Ambulatory Visit: Admitting: Family Medicine

## 2024-05-24 NOTE — Telephone Encounter (Signed)
 Calling [employer] regarding pt's Repatha  grant - it is still saying under review for diagnosis verification despite having called twice last week.   Darrelyn Drum, PharmD, BCPS, CPP Clinical Pharmacist Practitioner [place] [facility] at [facility] [phone]

## 2024-05-25 ENCOUNTER — Telehealth: Payer: Self-pay

## 2024-05-25 ENCOUNTER — Ambulatory Visit: Admitting: Family Medicine

## 2024-05-25 ENCOUNTER — Encounter: Payer: Self-pay | Admitting: Family Medicine

## 2024-05-25 VITALS — BP 122/64 | HR 65 | Ht 63.0 in | Wt 124.0 lb

## 2024-05-25 DIAGNOSIS — M9903 Segmental and somatic dysfunction of lumbar region: Secondary | ICD-10-CM | POA: Diagnosis not present

## 2024-05-25 DIAGNOSIS — M9904 Segmental and somatic dysfunction of sacral region: Secondary | ICD-10-CM

## 2024-05-25 DIAGNOSIS — M940 Chondrocostal junction syndrome [Tietze]: Secondary | ICD-10-CM | POA: Diagnosis not present

## 2024-05-25 DIAGNOSIS — M9902 Segmental and somatic dysfunction of thoracic region: Secondary | ICD-10-CM

## 2024-05-25 MED ORDER — METHYLPREDNISOLONE ACETATE 80 MG/ML IJ SUSP
80.0000 mg | Freq: Once | INTRAMUSCULAR | Status: AC
  Administered 2024-05-25: 80 mg via INTRAMUSCULAR

## 2024-05-25 MED ORDER — CYCLOBENZAPRINE HCL 5 MG PO TABS: 5.0000 mg | ORAL_TABLET | Freq: Every day | ORAL | 0 refills | Status: AC

## 2024-05-25 MED ORDER — PREDNISONE 20 MG PO TABS: 20.0000 mg | ORAL_TABLET | Freq: Every day | ORAL | 0 refills | Status: AC

## 2024-05-25 MED ORDER — KETOROLAC TROMETHAMINE 60 MG/2ML IM SOLN
60.0000 mg | Freq: Once | INTRAMUSCULAR | Status: AC
  Administered 2024-05-25: 60 mg via INTRAMUSCULAR

## 2024-05-25 NOTE — Telephone Encounter (Signed)
 Put in new referral for back pain in [facility] portal in 10/23/2024

## 2024-05-25 NOTE — Assessment & Plan Note (Signed)
 Slipped rib syndrome, discussed icing regimen and home exercises, which activities to do and which ones to avoid.  Increase activity slowly.  Worsening pain in the Toradol  and Depo-Medrol  given.  Flexeril  refilled.

## 2024-05-25 NOTE — Patient Instructions (Addendum)
 Refilled flexeril  and prednisone  Full cocktail injection today. No NSAIDs for 24 hours. See me in 3 months.

## 2024-05-25 NOTE — Progress Notes (Signed)
 " [facility] [address] [place] [address] Phone: [phone] Subjective:   LILLETTE Berwyn Posey, am serving as a scribe for Dr. Arthea Claudene.  I'm seeing this patient by the request  of:  Alvia Corean CROME, FNP  CC: Back and neck pain follow-up  YEP:[name]  JANAIYA BEAUCHESNE is a 74 y.o. female coming in with complaint of back and neck pain. OMT 02/09/2024. Patient states that she is traveling to [facility] and would like refill on Flexerill. Pain has been off and on since last visit. Pain worse over the past few days.   Medications patient has been prescribed: Flexeril , Gabapentin , Meloxicam    Taking:         Reviewed prior external information including notes and imaging from previsou exam, outside providers and external EMR if available.   As well as notes that were available from care everywhere and other healthcare systems.  Past medical history, social, surgical and family history all reviewed in electronic medical record.  No pertanent information unless stated regarding to the chief complaint.   Past Medical History:  Diagnosis Date   Allergy    Since childhood   ANXIETY 12/01/2006   Arthritis    Blepharospasm 12/01/2006   BONE SPUR 12/01/2006   Cellulitis and abscess of oral soft tissues 05/01/2008   Cervicalgia 08/24/2008   COLD SORE 05/30/2008   DEPRESSION 12/01/2006   EXCISION OF GANGLION CYST, WRIST, HX OF 12/01/2006   GERD 12/01/2006   HYPERLIPIDEMIA 12/01/2006   HYPERTENSION, BORDERLINE 12/01/2006   LOW BACK PAIN 12/01/2006   MACULAR DEGENERATION 12/01/2006   OTITIS MEDIA, ACUTE, LEFT 05/30/2008   PARESTHESIA 08/24/2008   SINUSITIS- ACUTE-NOS 01/07/2010    Allergies[1]   Review of Systems:  No headache, visual changes, nausea, vomiting, diarrhea, constipation, dizziness, abdominal pain, skin rash, fevers, chills, night sweats, weight loss, swollen lymph nodes, body aches, joint swelling, chest pain,  shortness of breath, mood changes. POSITIVE muscle aches  Objective  Blood pressure 122/64, pulse 65, height 5' 3 (1.6 m), weight 124 lb (56.2 kg), SpO2 99%.   General: No apparent distress alert and oriented x3 mood and affect normal, dressed appropriately.  HEENT: Pupils equal, extraocular movements intact  Respiratory: Patient's speak in full sentences and does not appear short of breath  Cardiovascular: No lower extremity edema, non tender, no erythema  Gait MSK:  Back does have some loss lordosis.  Patient does have significant tightness in the parascapular area.  Osteopathic findings  T9 extended rotated and side bent left inhaled rib L2 flexed rotated and side bent right L5 flexed rotated and side bent right Sacrum right on right       Assessment and Plan:  Slipped rib syndrome Slipped rib syndrome, discussed icing regimen and home exercises, which activities to do and which ones to avoid.  Increase activity slowly.  Worsening pain in the Toradol  and Depo-Medrol  given.  Flexeril  refilled.    Nonallopathic problems  Decision today to treat with OMT was based on Physical Exam  After verbal consent patient was treated with HVLA, ME, FPR techniques in  rib, thoracic, lumbar, and sacral  areas  Patient tolerated the procedure well with improvement in symptoms  Patient given exercises, stretches and lifestyle modifications  See medications in patient instructions if given  Patient will follow up in 4-8 weeks     The above documentation has been reviewed and is accurate and complete Elija Mccamish M Severina Sykora, DO  Note: This dictation was prepared with Dragon dictation along with smaller phrase technology. Any transcriptional errors that result from this process are unintentional.            [1]  Allergies Allergen Reactions   Codeine    Hydrocodone -Acetaminophen     Loratadine-Pseudoephedrine Er    Statins Other (See Comments)    Joint and muscle cramp    Augmentin  [Amoxicillin -Pot Clavulanate] Diarrhea and Nausea And Vomiting   "

## 2024-06-01 ENCOUNTER — Telehealth: Payer: Self-pay | Admitting: Pharmacist

## 2024-06-01 NOTE — Telephone Encounter (Signed)
 Contacting [employer] regarding grant for Repatha  still showing under review for diagnosis verification despite form being submitted 3 weeks ago and multiple calls being made. [employer] informed me that it is still under review due to a glitch in their system. It is anticipated to be approved, we are just having to wait unitl they manually review.  Contacted patient to update to her on the status. She notes she has 1 more pen left, which she will take next week. She has 3 weeks left until she will need the prescription. I will continue checking back on the grant status and let the patient know once it has been approved.   Darrelyn Drum, PharmD, BCPS, CPP Clinical Pharmacist Practitioner [facility] [phone]

## 2024-06-02 ENCOUNTER — Encounter: Payer: Self-pay | Admitting: Family Medicine

## 2024-08-23 ENCOUNTER — Ambulatory Visit: Admitting: Family Medicine

## 2025-01-20 ENCOUNTER — Ambulatory Visit
